# Patient Record
Sex: Female | Born: 1935 | Race: White | Hispanic: No | Marital: Married | Smoking: Never smoker
Health system: Southern US, Community
[De-identification: ages and names within clinical notes are randomized; demographics above are authoritative.]

## PROBLEM LIST (undated history)

## (undated) DIAGNOSIS — T4145XA Adverse effect of unspecified anesthetic, initial encounter: Secondary | ICD-10-CM

## (undated) DIAGNOSIS — D593 Hemolytic-uremic syndrome: Secondary | ICD-10-CM

## (undated) DIAGNOSIS — H9193 Unspecified hearing loss, bilateral: Secondary | ICD-10-CM

## (undated) DIAGNOSIS — C801 Malignant (primary) neoplasm, unspecified: Secondary | ICD-10-CM

## (undated) DIAGNOSIS — I499 Cardiac arrhythmia, unspecified: Secondary | ICD-10-CM

## (undated) DIAGNOSIS — M1990 Unspecified osteoarthritis, unspecified site: Secondary | ICD-10-CM

## (undated) DIAGNOSIS — R55 Syncope and collapse: Secondary | ICD-10-CM

## (undated) DIAGNOSIS — C8200 Follicular lymphoma grade I, unspecified site: Secondary | ICD-10-CM

## (undated) DIAGNOSIS — I722 Aneurysm of renal artery: Secondary | ICD-10-CM

## (undated) DIAGNOSIS — I10 Essential (primary) hypertension: Secondary | ICD-10-CM

## (undated) DIAGNOSIS — R0989 Other specified symptoms and signs involving the circulatory and respiratory systems: Secondary | ICD-10-CM

## (undated) DIAGNOSIS — Z9289 Personal history of other medical treatment: Secondary | ICD-10-CM

## (undated) DIAGNOSIS — Z9221 Personal history of antineoplastic chemotherapy: Secondary | ICD-10-CM

## (undated) DIAGNOSIS — H8109 Meniere's disease, unspecified ear: Secondary | ICD-10-CM

## (undated) DIAGNOSIS — T8859XA Other complications of anesthesia, initial encounter: Secondary | ICD-10-CM

## (undated) DIAGNOSIS — M810 Age-related osteoporosis without current pathological fracture: Secondary | ICD-10-CM

## (undated) DIAGNOSIS — G459 Transient cerebral ischemic attack, unspecified: Secondary | ICD-10-CM

## (undated) DIAGNOSIS — Z923 Personal history of irradiation: Secondary | ICD-10-CM

## (undated) DIAGNOSIS — R112 Nausea with vomiting, unspecified: Secondary | ICD-10-CM

## (undated) DIAGNOSIS — K219 Gastro-esophageal reflux disease without esophagitis: Secondary | ICD-10-CM

## (undated) DIAGNOSIS — Z9889 Other specified postprocedural states: Secondary | ICD-10-CM

## (undated) DIAGNOSIS — G250 Essential tremor: Secondary | ICD-10-CM

## (undated) DIAGNOSIS — E860 Dehydration: Secondary | ICD-10-CM

## (undated) DIAGNOSIS — I639 Cerebral infarction, unspecified: Secondary | ICD-10-CM

## (undated) HISTORY — PX: COLONOSCOPY: SHX174

## (undated) HISTORY — PX: BREAST LUMPECTOMY: SHX2

## (undated) HISTORY — DX: Age-related osteoporosis without current pathological fracture: M81.0

## (undated) HISTORY — DX: Transient cerebral ischemic attack, unspecified: G45.9

## (undated) HISTORY — DX: Cerebral infarction, unspecified: I63.9

## (undated) HISTORY — PX: TUBAL LIGATION: SHX77

## (undated) HISTORY — DX: Follicular lymphoma grade I, unspecified site: C82.00

## (undated) HISTORY — DX: Meniere's disease, unspecified ear: H81.09

## (undated) HISTORY — PX: FRACTURE SURGERY: SHX138

## (undated) HISTORY — DX: Hemolytic-uremic syndrome: D59.3

## (undated) HISTORY — PX: APPENDECTOMY: SHX54

---
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
## 2002-08-06 DIAGNOSIS — C8200 Follicular lymphoma grade I, unspecified site: Secondary | ICD-10-CM

## 2002-08-06 DIAGNOSIS — C8290 Follicular lymphoma, unspecified, unspecified site: Secondary | ICD-10-CM | POA: Insufficient documentation

## 2002-08-06 HISTORY — DX: Follicular lymphoma grade I, unspecified site: C82.00

## 2002-08-07 HISTORY — PX: LYMPH NODE BIOPSY: SHX201

## 2002-08-09 HISTORY — PX: BONE MARROW BIOPSY: SHX199

## 2006-06-05 ENCOUNTER — Ambulatory Visit: Payer: Self-pay | Admitting: Internal Medicine

## 2006-07-27 ENCOUNTER — Ambulatory Visit: Payer: Self-pay | Admitting: Internal Medicine

## 2007-01-28 ENCOUNTER — Ambulatory Visit: Payer: Self-pay | Admitting: Internal Medicine

## 2008-02-03 ENCOUNTER — Ambulatory Visit: Payer: Self-pay | Admitting: Internal Medicine

## 2008-10-17 ENCOUNTER — Ambulatory Visit: Payer: Self-pay | Admitting: Hematology and Oncology

## 2009-02-15 ENCOUNTER — Ambulatory Visit: Payer: Self-pay | Admitting: Ophthalmology

## 2009-02-27 ENCOUNTER — Ambulatory Visit: Payer: Self-pay | Admitting: Ophthalmology

## 2009-03-15 HISTORY — PX: CATARACT EXTRACTION: SUR2

## 2009-09-21 ENCOUNTER — Ambulatory Visit: Payer: Self-pay | Admitting: Internal Medicine

## 2010-03-27 ENCOUNTER — Ambulatory Visit: Payer: Self-pay | Admitting: Internal Medicine

## 2010-10-16 ENCOUNTER — Ambulatory Visit: Payer: Self-pay | Admitting: Internal Medicine

## 2010-10-29 ENCOUNTER — Ambulatory Visit: Payer: Self-pay | Admitting: Internal Medicine

## 2011-09-19 DIAGNOSIS — E559 Vitamin D deficiency, unspecified: Secondary | ICD-10-CM | POA: Insufficient documentation

## 2011-09-19 DIAGNOSIS — K219 Gastro-esophageal reflux disease without esophagitis: Secondary | ICD-10-CM | POA: Insufficient documentation

## 2011-09-19 DIAGNOSIS — C8590 Non-Hodgkin lymphoma, unspecified, unspecified site: Secondary | ICD-10-CM | POA: Insufficient documentation

## 2011-11-07 ENCOUNTER — Ambulatory Visit: Payer: Self-pay | Admitting: Family Medicine

## 2011-12-16 DIAGNOSIS — C801 Malignant (primary) neoplasm, unspecified: Secondary | ICD-10-CM

## 2011-12-16 HISTORY — DX: Malignant (primary) neoplasm, unspecified: C80.1

## 2012-09-14 DIAGNOSIS — R251 Tremor, unspecified: Secondary | ICD-10-CM | POA: Insufficient documentation

## 2012-09-14 DIAGNOSIS — R55 Syncope and collapse: Secondary | ICD-10-CM | POA: Insufficient documentation

## 2012-09-30 DIAGNOSIS — H44119 Panuveitis, unspecified eye: Secondary | ICD-10-CM | POA: Insufficient documentation

## 2012-09-30 DIAGNOSIS — H359 Unspecified retinal disorder: Secondary | ICD-10-CM | POA: Insufficient documentation

## 2012-11-26 ENCOUNTER — Ambulatory Visit: Payer: Self-pay | Admitting: Family Medicine

## 2012-12-17 ENCOUNTER — Ambulatory Visit: Payer: Self-pay | Admitting: Family Medicine

## 2012-12-28 DIAGNOSIS — R921 Mammographic calcification found on diagnostic imaging of breast: Secondary | ICD-10-CM | POA: Insufficient documentation

## 2013-03-16 DIAGNOSIS — G250 Essential tremor: Secondary | ICD-10-CM | POA: Insufficient documentation

## 2013-05-04 ENCOUNTER — Encounter: Payer: Self-pay | Admitting: Otolaryngology

## 2013-05-15 ENCOUNTER — Encounter: Payer: Self-pay | Admitting: Otolaryngology

## 2013-06-14 ENCOUNTER — Encounter: Payer: Self-pay | Admitting: Otolaryngology

## 2013-09-16 DIAGNOSIS — R634 Abnormal weight loss: Secondary | ICD-10-CM | POA: Insufficient documentation

## 2013-09-19 DIAGNOSIS — B351 Tinea unguium: Secondary | ICD-10-CM

## 2013-11-26 ENCOUNTER — Emergency Department: Payer: Self-pay | Admitting: Emergency Medicine

## 2013-11-26 LAB — URINALYSIS, COMPLETE
Bilirubin,UR: NEGATIVE
Glucose,UR: NEGATIVE mg/dL (ref 0–75)
Leukocyte Esterase: NEGATIVE
Ph: 7 (ref 4.5–8.0)
Protein: NEGATIVE
Specific Gravity: 1.008 (ref 1.003–1.030)
Squamous Epithelial: <1
WBC UR: 1 /HPF (ref 0–5)

## 2013-11-26 LAB — COMPREHENSIVE METABOLIC PANEL
Alkaline Phosphatase: 61 U/L
Anion Gap: 5 — ABNORMAL LOW (ref 7–16)
BUN: 10 mg/dL (ref 7–18)
Chloride: 91 mmol/L — ABNORMAL LOW (ref 98–107)
Co2: 29 mmol/L (ref 21–32)
EGFR (African American): >60
EGFR (Non-African Amer.): >60
SGOT(AST): 35 U/L (ref 15–37)
SGPT (ALT): 29 U/L (ref 12–78)
Sodium: 125 mmol/L — ABNORMAL LOW (ref 136–145)
Total Protein: 6 g/dL — ABNORMAL LOW (ref 6.4–8.2)

## 2013-11-26 LAB — CBC
HCT: 38.1 % (ref 35.0–47.0)
HGB: 12.4 g/dL (ref 12.0–16.0)
Platelet: 169 10*3/uL (ref 150–440)
RDW: 13.6 % (ref 11.5–14.5)

## 2013-12-15 DIAGNOSIS — I639 Cerebral infarction, unspecified: Secondary | ICD-10-CM

## 2013-12-15 HISTORY — DX: Cerebral infarction, unspecified: I63.9

## 2014-02-03 DIAGNOSIS — M79609 Pain in unspecified limb: Secondary | ICD-10-CM

## 2014-04-27 DIAGNOSIS — M79609 Pain in unspecified limb: Secondary | ICD-10-CM

## 2014-07-31 DIAGNOSIS — B351 Tinea unguium: Secondary | ICD-10-CM

## 2014-08-28 ENCOUNTER — Ambulatory Visit: Payer: Self-pay | Admitting: Oncology

## 2014-08-28 LAB — LACTATE DEHYDROGENASE: LDH: 151 U/L (ref 81–246)

## 2014-08-28 LAB — CBC CANCER CENTER
BASOS PCT: 1.4 %
Basophil #: 0.1 x10 3/mm (ref 0.0–0.1)
EOS PCT: 1.2 %
Eosinophil #: 0.1 x10 3/mm (ref 0.0–0.7)
HCT: 41.2 % (ref 35.0–47.0)
HGB: 13.6 g/dL (ref 12.0–16.0)
LYMPHS ABS: 1 x10 3/mm (ref 1.0–3.6)
Lymphocyte %: 14.2 %
MCH: 33 pg (ref 26.0–34.0)
MCHC: 32.9 g/dL (ref 32.0–36.0)
MCV: 100 fL (ref 80–100)
MONO ABS: 0.6 x10 3/mm (ref 0.2–0.9)
Monocyte %: 9.1 %
Neutrophil #: 5.2 x10 3/mm (ref 1.4–6.5)
Neutrophil %: 74.1 %
Platelet: 226 x10 3/mm (ref 150–440)
RBC: 4.11 10*6/uL (ref 3.80–5.20)
RDW: 12.9 % (ref 11.5–14.5)
WBC: 7 x10 3/mm (ref 3.6–11.0)

## 2014-09-14 ENCOUNTER — Ambulatory Visit: Payer: Self-pay | Admitting: Oncology

## 2014-10-24 DIAGNOSIS — B351 Tinea unguium: Secondary | ICD-10-CM

## 2015-01-25 DIAGNOSIS — L600 Ingrowing nail: Secondary | ICD-10-CM

## 2015-02-23 ENCOUNTER — Ambulatory Visit
Admit: 2015-02-23 | Disposition: A | Discharge status: Home / Self Care | Payer: Self-pay | Attending: Hematology and Oncology | Admitting: Hematology and Oncology

## 2015-03-16 ENCOUNTER — Ambulatory Visit
Admit: 2015-03-16 | Disposition: A | Discharge status: Home / Self Care | Payer: Self-pay | Attending: Hematology and Oncology | Admitting: Hematology and Oncology

## 2015-03-30 ENCOUNTER — Other Ambulatory Visit: Payer: Self-pay | Admitting: Hematology and Oncology

## 2015-03-30 DIAGNOSIS — C8290 Follicular lymphoma, unspecified, unspecified site: Secondary | ICD-10-CM

## 2015-04-03 ENCOUNTER — Other Ambulatory Visit: Payer: Self-pay | Admitting: Family Medicine

## 2015-04-03 DIAGNOSIS — Z13820 Encounter for screening for osteoporosis: Secondary | ICD-10-CM

## 2015-04-18 ENCOUNTER — Ambulatory Visit
Admission: RE | Admit: 2015-04-18 | Discharge: 2015-04-18 | Disposition: A | Discharge status: Home / Self Care | Payer: Medicare Other | Source: Ambulatory Visit | Attending: Family Medicine | Admitting: Family Medicine

## 2015-04-18 DIAGNOSIS — Z13820 Encounter for screening for osteoporosis: Secondary | ICD-10-CM | POA: Diagnosis not present

## 2015-04-18 DIAGNOSIS — M810 Age-related osteoporosis without current pathological fracture: Secondary | ICD-10-CM | POA: Insufficient documentation

## 2015-05-16 DIAGNOSIS — B351 Tinea unguium: Secondary | ICD-10-CM

## 2015-05-28 ENCOUNTER — Other Ambulatory Visit: Payer: Self-pay | Admitting: Oncology

## 2015-08-13 DIAGNOSIS — D593 Hemolytic-uremic syndrome, unspecified: Secondary | ICD-10-CM

## 2015-08-13 HISTORY — DX: Hemolytic-uremic syndrome: D59.3

## 2015-08-13 HISTORY — DX: Hemolytic-uremic syndrome, unspecified: D59.30

## 2015-08-15 DIAGNOSIS — A498 Other bacterial infections of unspecified site: Secondary | ICD-10-CM | POA: Insufficient documentation

## 2015-08-15 DIAGNOSIS — S37009A Unspecified injury of unspecified kidney, initial encounter: Secondary | ICD-10-CM | POA: Insufficient documentation

## 2015-08-24 ENCOUNTER — Encounter
Admission: RE | Admit: 2015-08-24 | Discharge: 2015-08-24 | Disposition: A | Discharge status: Home / Self Care | Payer: Medicare Other | Source: Ambulatory Visit | Attending: Internal Medicine | Admitting: Internal Medicine

## 2015-09-03 ENCOUNTER — Telehealth: Payer: Self-pay | Admitting: *Deleted

## 2015-09-03 NOTE — Telephone Encounter (Signed)
  I reviewed her last note.  She does need a chest CT.  We can accept the scans from [facility].  M

## 2015-09-03 NOTE — Telephone Encounter (Signed)
Has CT ordered for Friday and fu later this month, Has been hospitalized at [facility] with E Coli and is now in rehab at [facility]. Asking if she still need to have CT scan done since she had a scan in [facility] (she had CT Abd and Pelvis, NOT a Chest)

## 2015-09-03 NOTE — Telephone Encounter (Signed)
Patient on Isolation at facility and unable to make appt for Friday. He requested that we cancel her appts and he will call to reschedule them once she is discharged from facility

## 2015-09-03 NOTE — Telephone Encounter (Signed)
  Sounds good. I will send a reminder to Theadora Rama so we keep her on our list.  M

## 2015-09-04 ENCOUNTER — Other Ambulatory Visit
Admission: RE | Admit: 2015-09-04 | Discharge: 2015-09-04 | Disposition: A | Discharge status: Home / Self Care | Payer: Medicare Other | Source: Other Acute Inpatient Hospital | Attending: Gerontology | Admitting: Gerontology

## 2015-09-04 LAB — CBC WITH DIFFERENTIAL/PLATELET
BASOS ABS: 0.1 10*3/uL (ref 0–0.1)
BASOS PCT: 1 %
Eosinophils Absolute: 0.2 10*3/uL (ref 0–0.7)
Eosinophils Relative: 2 %
HEMATOCRIT: 23.6 % — AB (ref 35.0–47.0)
Hemoglobin: 7.6 g/dL — ABNORMAL LOW (ref 12.0–16.0)
LYMPHS PCT: 6 %
Lymphs Abs: 0.4 10*3/uL — ABNORMAL LOW (ref 1.0–3.6)
MCH: 31.7 pg (ref 26.0–34.0)
MCHC: 32.1 g/dL (ref 32.0–36.0)
MCV: 98.8 fL (ref 80.0–100.0)
MONO ABS: 0.5 10*3/uL (ref 0.2–0.9)
Monocytes Relative: 7 %
NEUTROS ABS: 6.3 10*3/uL (ref 1.4–6.5)
NEUTROS PCT: 84 %
Platelets: 274 10*3/uL (ref 150–440)
RBC: 2.39 MIL/uL — AB (ref 3.80–5.20)
RDW: 17.7 % — AB (ref 11.5–14.5)
WBC: 7.5 10*3/uL (ref 3.6–11.0)

## 2015-09-04 LAB — BASIC METABOLIC PANEL
ANION GAP: 5 (ref 5–15)
BUN: 15 mg/dL (ref 6–20)
CALCIUM: 7.4 mg/dL — AB (ref 8.9–10.3)
CO2: 28 mmol/L (ref 22–32)
Chloride: 101 mmol/L (ref 101–111)
Creatinine, Ser: 0.74 mg/dL (ref 0.44–1.00)
GLUCOSE: 131 mg/dL — AB (ref 65–99)
POTASSIUM: 3.1 mmol/L — AB (ref 3.5–5.1)
SODIUM: 134 mmol/L — AB (ref 135–145)

## 2015-09-04 LAB — HEPATIC FUNCTION PANEL
ALBUMIN: 2.3 g/dL — AB (ref 3.5–5.0)
ALT: 18 U/L (ref 14–54)
AST: 22 U/L (ref 15–41)
Alkaline Phosphatase: 135 U/L — ABNORMAL HIGH (ref 38–126)
BILIRUBIN TOTAL: 0.4 mg/dL (ref 0.3–1.2)
Bilirubin, Direct: <0.1 mg/dL — ABNORMAL LOW (ref 0.1–0.5)
Total Protein: 4.2 g/dL — ABNORMAL LOW (ref 6.5–8.1)

## 2015-09-04 LAB — MAGNESIUM: Magnesium: 1.6 mg/dL — ABNORMAL LOW (ref 1.7–2.4)

## 2015-09-04 LAB — TSH: TSH: 10.058 u[IU]/mL — AB (ref 0.350–4.500)

## 2015-09-04 LAB — LACTATE DEHYDROGENASE: LDH: 204 U/L — AB (ref 98–192)

## 2015-09-05 LAB — VITAMIN B12: VITAMIN B 12: 571 pg/mL (ref 180–914)

## 2015-09-07 ENCOUNTER — Ambulatory Visit: Payer: Medicare Other

## 2015-09-07 ENCOUNTER — Other Ambulatory Visit: Payer: Medicare Other

## 2015-09-09 LAB — VITAMIN D 1,25 DIHYDROXY
VITAMIN D3 1, 25 (OH): 50 pg/mL
Vitamin D 1, 25 (OH)2 Total: 51 pg/mL

## 2015-09-11 ENCOUNTER — Ambulatory Visit: Payer: Medicare Other | Admitting: Hematology and Oncology

## 2015-09-11 LAB — COMPREHENSIVE METABOLIC PANEL
ALBUMIN: 2.5 g/dL — AB (ref 3.5–5.0)
ALT: 38 U/L (ref 14–54)
ANION GAP: 8 (ref 5–15)
AST: 28 U/L (ref 15–41)
Alkaline Phosphatase: 251 U/L — ABNORMAL HIGH (ref 38–126)
BUN: 22 mg/dL — ABNORMAL HIGH (ref 6–20)
CHLORIDE: 99 mmol/L — AB (ref 101–111)
CO2: 27 mmol/L (ref 22–32)
Calcium: 7.8 mg/dL — ABNORMAL LOW (ref 8.9–10.3)
Creatinine, Ser: 0.73 mg/dL (ref 0.44–1.00)
GFR calc non Af Amer: >60 mL/min (ref >60)
GLUCOSE: 102 mg/dL — AB (ref 65–99)
Potassium: 3.6 mmol/L (ref 3.5–5.1)
SODIUM: 134 mmol/L — AB (ref 135–145)
Total Bilirubin: 0.6 mg/dL (ref 0.3–1.2)
Total Protein: 4.6 g/dL — ABNORMAL LOW (ref 6.5–8.1)

## 2015-09-11 LAB — CBC WITH DIFFERENTIAL/PLATELET
BASOS PCT: 1 %
Basophils Absolute: 0.1 10*3/uL (ref 0–0.1)
EOS ABS: 0.3 10*3/uL (ref 0–0.7)
EOS PCT: 4 %
HCT: 25.6 % — ABNORMAL LOW (ref 35.0–47.0)
HEMOGLOBIN: 8.5 g/dL — AB (ref 12.0–16.0)
LYMPHS ABS: 0.4 10*3/uL — AB (ref 1.0–3.6)
Lymphocytes Relative: 5 %
MCH: 33.2 pg (ref 26.0–34.0)
MCHC: 33.1 g/dL (ref 32.0–36.0)
MCV: 100.4 fL — ABNORMAL HIGH (ref 80.0–100.0)
Monocytes Absolute: 0.9 10*3/uL (ref 0.2–0.9)
Monocytes Relative: 12 %
NEUTROS PCT: 78 %
Neutro Abs: 5.9 10*3/uL (ref 1.4–6.5)
PLATELETS: 333 10*3/uL (ref 150–440)
RBC: 2.55 MIL/uL — AB (ref 3.80–5.20)
RDW: 17.6 % — ABNORMAL HIGH (ref 11.5–14.5)
WBC: 7.6 10*3/uL (ref 3.6–11.0)

## 2015-09-26 ENCOUNTER — Ambulatory Visit
Admission: RE | Admit: 2015-09-26 | Discharge: 2015-09-26 | Disposition: A | Discharge status: Home / Self Care | Payer: Medicare Other | Source: Ambulatory Visit | Attending: Hematology and Oncology | Admitting: Hematology and Oncology

## 2015-09-26 DIAGNOSIS — C8280 Other types of follicular lymphoma, unspecified site: Secondary | ICD-10-CM | POA: Insufficient documentation

## 2015-09-26 DIAGNOSIS — I722 Aneurysm of renal artery: Secondary | ICD-10-CM | POA: Insufficient documentation

## 2015-09-26 DIAGNOSIS — C8290 Follicular lymphoma, unspecified, unspecified site: Secondary | ICD-10-CM

## 2015-09-26 DIAGNOSIS — R160 Hepatomegaly, not elsewhere classified: Secondary | ICD-10-CM | POA: Insufficient documentation

## 2015-09-26 HISTORY — DX: Malignant (primary) neoplasm, unspecified: C80.1

## 2015-09-26 MED ORDER — IOHEXOL 300 MG/ML  SOLN
100.0000 mL | Freq: Once | INTRAMUSCULAR | Status: AC | PRN
  Administered 2015-09-26: 85 mL via INTRAVENOUS

## 2015-10-02 ENCOUNTER — Inpatient Hospital Stay: Payer: Medicare Other | Attending: Hematology and Oncology | Admitting: Hematology and Oncology

## 2015-10-02 VITALS — BP 158/73 | HR 79 | Temp 96.6°F | Resp 18 | Ht 65.0 in | Wt 111.3 lb

## 2015-10-02 DIAGNOSIS — Z9221 Personal history of antineoplastic chemotherapy: Secondary | ICD-10-CM | POA: Diagnosis not present

## 2015-10-02 DIAGNOSIS — Z8673 Personal history of transient ischemic attack (TIA), and cerebral infarction without residual deficits: Secondary | ICD-10-CM | POA: Diagnosis not present

## 2015-10-02 DIAGNOSIS — R160 Hepatomegaly, not elsewhere classified: Secondary | ICD-10-CM | POA: Diagnosis not present

## 2015-10-02 DIAGNOSIS — M810 Age-related osteoporosis without current pathological fracture: Secondary | ICD-10-CM | POA: Diagnosis not present

## 2015-10-02 DIAGNOSIS — R600 Localized edema: Secondary | ICD-10-CM | POA: Diagnosis not present

## 2015-10-02 DIAGNOSIS — Z79899 Other long term (current) drug therapy: Secondary | ICD-10-CM | POA: Insufficient documentation

## 2015-10-02 DIAGNOSIS — C8238 Follicular lymphoma grade IIIa, lymph nodes of multiple sites: Secondary | ICD-10-CM

## 2015-10-02 DIAGNOSIS — R2689 Other abnormalities of gait and mobility: Secondary | ICD-10-CM | POA: Insufficient documentation

## 2015-10-02 DIAGNOSIS — Z7982 Long term (current) use of aspirin: Secondary | ICD-10-CM | POA: Insufficient documentation

## 2015-10-02 DIAGNOSIS — Z923 Personal history of irradiation: Secondary | ICD-10-CM

## 2015-10-02 DIAGNOSIS — C8203 Follicular lymphoma grade I, intra-abdominal lymph nodes: Secondary | ICD-10-CM

## 2015-10-02 NOTE — Progress Notes (Signed)
Patient is here for yearly follow-up of lymphoma and CT Scan results. Patient states that she was recently in the hospital at [facility] for 13 days with E. Coli. She was then discharged to rehab in [place] for 3 weeks. She states that she is feeling much better since coming home.

## 2015-10-02 NOTE — Progress Notes (Signed)
[facility] day:  10/02/2015  Chief Complaint: BEREA MAJKOWSKI is a 81 y.o. female with a history of stage IIIA follicular non-Hodgkin's lymphoma who is seen for 6 month assessment.  HPI: The patient was last seen in the medical oncology clinic on 02/26/2015.  At that time, she was seen for initial assessment by me. Symptomatically, she denied any B symptoms.  Exam revealed a tiny left posterior cervical lymph node and no hepatosplenomegaly.  Labs included a hematocrit of 40.5, hemoglobin 13.6, MCV is 96, platelets 244,000, white count 4900 with an ANC of 3700.  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  She had a complicated course with acute renal failure and anemia.  She was then in rehab for 3 weeks.  She continues to receive physical therapy.  She is working on her balance.  Per prior plan, she was scheduled for follow-up imaging studies.  Chest, abdomen, and pelvic CT scan on 09/26/2015 revealed stable portacaval and aortocaval lymph nodes and mild hepatomegaly.  Portacaval nodes measured 2.5 cm.  Aortocaval nodes measured 1.0 cm.  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  Symptomatically, she has limited energy.  She still has some balance issues.  She uses a cane.  She has some edema in her ankles.  She is wearing Ted hose.  Past Medical History  Diagnosis Date  . Cancer [facility]) 2013    Non-Hodgkin Lymphoma with chemo and rad tx  . 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 Mini stroke [facility])     summer 2014  . Meniere's disease     with bilateral hearing loss  . Osteoporosis   . Follicular lymphoma grade I ([place]) 08/06/2002    Past Surgical History  Procedure Laterality Date  . Cataract extraction  03/2009  . Tubal ligation    . Breast lumpectomy  1950s    benign  .  Bone marrow biopsy  08/09/2002  . Lymph node biopsy  08/07/2002    cervical    No family history on file.  Social History:  has no tobacco, alcohol, and drug history on file.  The patient is accompanied by her husband today.  Allergies:  Allergies  Allergen Reactions  . Lisinopril Cough    Current Medications: Current Outpatient Prescriptions  Medication Sig Dispense Refill  . levothyroxine (SYNTHROID, LEVOTHROID) 25 MCG tablet Take by mouth.    . potassium chloride (MICRO-K) 10 MEQ CR capsule Take by mouth.    . torsemide (DEMADEX) 10 MG tablet Take by mouth.    . traZODone (DESYREL) 50 MG tablet TAKE 1 TO 2 TABLETS BY MOUTH DAILY AT BEDTIME AS NEEDED    . acetaminophen (TYLENOL) 325 MG tablet Take by mouth.    [name] aspirin 81 MG chewable tablet Chew by mouth.    . calcium-vitamin D (CALCIUM 500/D) 500-200 MG-UNIT tablet Take by mouth.    . famotidine (PEPCID) 20 MG tablet Take by mouth.    . Magnesium 200 MG TABS Take by mouth.    . Multiple Vitamin (MULTI-VITAMINS) TABS Take by mouth.    . triamcinolone cream (KENALOG) 0.1 % Apply topically.    . Turmeric Curcumin 500 MG CAPS Take by mouth.    . zoledronic acid (RECLAST) 5 MG/100ML SOLN injection Inject into the vein.  No current facility-administered medications for this visit.    Review of Systems:  GENERAL:  Limited energy.  No fevers, sweats or weight loss. PERFORMANCE STATUS (ECOG):  2 HEENT:  No visual changes, runny nose, sore throat, mouth sores or tenderness. Lungs: No shortness of breath or cough.  No hemoptysis. Cardiac:  No chest pain, palpitations, orthopnea, or PND. GI:  No nausea, vomiting, diarrhea, constipation, melena or hematochezia. GU:  No urgency, frequency, dysuria, or hematuria. Musculoskeletal:  No back pain.  No joint pain.  No muscle tenderness. Extremities:  Swelling in ankles. Skin:  No rashes or skin changes. Neuro:  Balance issues.  No headache, numbness or weakness or coordination  issues. Endocrine:  No diabetes, thyroid issues, hot flashes or night sweats. Psych:  No mood changes, depression or anxiety. Pain:  No focal pain. Review of systems:  All other systems reviewed and found to be negative.  Physical Exam: Blood pressure 158/73, pulse 79, temperature 96.6 F (35.9 C), temperature source Tympanic, resp. rate 18, height 5' 5"  (1.651 m), weight 111 lb 5.3 oz (50.5 kg). GENERAL:  Thin elderly woman sitting comfortably in the exam room in no acute distress. MENTAL STATUS:  Alert and oriented to person, place and time. HEAD:  Short gray hair.  Normocephalic, atraumatic, face symmetric, no Cushingoid features. EYES:  Glasses.  Blue eyes.  Pupils equal round and reactive to light and accomodation.  No conjunctivitis or scleral icterus. ENT:  Oropharynx clear without lesion.  Tongue normal. Mucous membranes moist.  RESPIRATORY:  Clear to auscultation without rales, wheezes or rhonchi. CARDIOVASCULAR:  Regular rate and rhythm without murmur, rub or gallop. ABDOMEN:  Soft, non-tender, with active bowel sounds, and no hepatosplenomegaly.  No masses. SKIN:  No rashes, ulcers or lesions. EXTREMITIES: No edema, no skin discoloration or tenderness.  No palpable cords. LYMPH NODES: No palpable cervical, supraclavicular, axillary or inguinal adenopathy  NEUROLOGICAL: Unremarkable. PSYCH:  Appropriate.  No visits with results within 3 Day(s) from this visit. Latest known visit with results is:  Hospital Outpatient Visit on 09/04/2015  Component Date Value Ref Range Status  . WBC 09/04/2015 7.5  3.6 - 11.0 K/uL Final  . RBC 09/04/2015 2.39* 3.80 - 5.20 MIL/uL Final  . Hemoglobin 09/04/2015 7.6* 12.0 - 16.0 g/dL Final  . HCT 09/04/2015 23.6* 35.0 - 47.0 % Final  . MCV 09/04/2015 98.8  80.0 - 100.0 fL Final  . MCH 09/04/2015 31.7  26.0 - 34.0 pg Final  . MCHC 09/04/2015 32.1  32.0 - 36.0 g/dL Final  . RDW 09/04/2015 17.7* 11.5 - 14.5 % Final  . Platelets 09/04/2015 274   150 - 440 K/uL Final  . Neutrophils Relative % 09/04/2015 84   Final  . Neutro Abs 09/04/2015 6.3  1.4 - 6.5 K/uL Final  . Lymphocytes Relative 09/04/2015 6   Final  . Lymphs Abs 09/04/2015 0.4* 1.0 - 3.6 K/uL Final  . Monocytes Relative 09/04/2015 7   Final  . Monocytes Absolute 09/04/2015 0.5  0.2 - 0.9 K/uL Final  . Eosinophils Relative 09/04/2015 2   Final  . Eosinophils Absolute 09/04/2015 0.2  0 - 0.7 K/uL Final  . Basophils Relative 09/04/2015 1   Final  . Basophils Absolute 09/04/2015 0.1  0 - 0.1 K/uL Final  . Total Protein 09/04/2015 4.2* 6.5 - 8.1 g/dL Final  . Albumin 09/04/2015 2.3* 3.5 - 5.0 g/dL Final  . AST 09/04/2015 22  15 - 41 U/L Final  . ALT 09/04/2015 18  14 - 54 U/L Final  . Alkaline Phosphatase 09/04/2015 135* 38 - 126 U/L Final  . Total Bilirubin 09/04/2015 0.4  0.3 - 1.2 mg/dL Final  . Bilirubin, Direct 09/04/2015 <0.1* 0.1 - 0.5 mg/dL Final  . Indirect Bilirubin 09/04/2015 NOT CALCULATED  0.3 - 0.9 mg/dL Final  . Magnesium 09/04/2015 1.6* 1.7 - 2.4 mg/dL Final  . TSH 09/04/2015 10.058* 0.350 - 4.500 uIU/mL Final  . Vitamin B-12 09/04/2015 571  180 - 914 pg/mL Final   Comment: (NOTE) This assay is not validated for testing neonatal or myeloproliferative syndrome specimens for Vitamin B12 levels. Performed at [facility]   . Vitamin D 1, 25 (OH)2 Total 09/04/2015 51   Final   Comment: (NOTE) Reference Range: Adults: 21 - 65   . Vitamin D3 1, 25 (OH)2 09/04/2015 50   Final   Comment: (NOTE) Performed At: [facility] [place], [place] [id number] Pepkowitz Sheral Apley MD [id number]   . Vitamin D2 1, 25 (OH)2 09/04/2015 <10   Final  . LDH 09/04/2015 204* 98 - 192 U/L Final    Assessment:  MYLINH CRAGG is a 81 y.o. female with a history of stage IIIA follicular non-Hodgkin's lymphoma.  She presented in the summer of 2003 with an epigastric and left upper quadrant mass and left arm weakness with decreased  mobility. Abdominal ultrasound revealed a large mass in the head of the pancreas with  multiple enlarged lymph nodes and a large lobulated mass in the right retroperitoneal area.  MRI of the left brachial plexus revealed a 2.5 cm soft tissue mass and abnormal lymph node along the left brachial plexus and in the supraclavicular region.  Cervical node biopsy on 08/06/2002 revealed a grade I follicular non-Hodgkin's lymphoma.  Bone marrow on 08/09/2002 was negative.  Lumbar puncture was negative.  She received Leukeran and Rituxan. As her arm pain and mobility was not relieved, she received local radiation to the left brachial plexus area. Leukeran continued through 02/28/2013 (discontinued secondary side effects).  She developed progressive disease in 10/2003. Patient received CVP chemotherapy beginning 01/02/2004. In 06/2004 she was switched to CNOP.  She received 8 cycles completing on 01/15/2005.  She began maintenance Rituxan on 04/23/2004 (4 weekly doses every 3 months for 2 years). Therapy completed on 09/13/2008.  Restaging chest, abdomen, and pelvic CT scan on 09/02/2013 revealed no significant interval change. She has some mildly enlarged intra-aortic caval retroperitoneal nodes measuring 1.6 x 1.2 cm. There were stable subcentimeter retroperitoneal nodes.  Chest, abdomen, and pelvic CT cans on 09/07/2014 revealed no evidence of lymphoma recurrence.  Chest, abdomen, and pelvic CT scan on 09/26/2015 revealed stable portacaval (2.5 cm) and aortocaval (1.0 cm) lymph nodes and mild hepatomegaly.    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  She had a complicated course with acute renal failure and anemia.  She was then in rehab for 3 weeks.  She continues to receive physical therapy.  She is working on her balance.  Symptomatically, she denies any B symptoms.  Exam reveals no adenopathy or hepatosplenomegaly.  Plan: 1. Labs today:   CBC with diff, CMP, LDH, uric acid. 2. Review interval CT scans-done. 3. RTC in 6 months for MD assessment and labs (CBC with diff, CMP, LDH, uric acid)   Lequita Asal, MD  10/02/2015, 5:02 PM

## 2015-12-27 DIAGNOSIS — Z87898 Personal history of other specified conditions: Secondary | ICD-10-CM | POA: Insufficient documentation

## 2016-01-28 ENCOUNTER — Encounter: Payer: Self-pay | Admitting: Hematology and Oncology

## 2016-02-01 DIAGNOSIS — M79673 Pain in unspecified foot: Secondary | ICD-10-CM

## 2016-03-05 DIAGNOSIS — I722 Aneurysm of renal artery: Secondary | ICD-10-CM | POA: Insufficient documentation

## 2016-03-06 DIAGNOSIS — E7800 Pure hypercholesterolemia, unspecified: Secondary | ICD-10-CM | POA: Insufficient documentation

## 2016-03-31 ENCOUNTER — Other Ambulatory Visit: Payer: Self-pay | Admitting: *Deleted

## 2016-03-31 DIAGNOSIS — C8203 Follicular lymphoma grade I, intra-abdominal lymph nodes: Secondary | ICD-10-CM

## 2016-04-01 ENCOUNTER — Inpatient Hospital Stay: Payer: Medicare Other | Attending: Hematology and Oncology

## 2016-04-01 ENCOUNTER — Encounter: Payer: Self-pay | Admitting: Hematology and Oncology

## 2016-04-01 ENCOUNTER — Inpatient Hospital Stay (HOSPITAL_BASED_OUTPATIENT_CLINIC_OR_DEPARTMENT_OTHER): Payer: Medicare Other | Admitting: Hematology and Oncology

## 2016-04-01 VITALS — BP 143/80 | HR 61 | Temp 95.9°F | Resp 17 | Ht 65.0 in | Wt 110.2 lb

## 2016-04-01 DIAGNOSIS — Z8673 Personal history of transient ischemic attack (TIA), and cerebral infarction without residual deficits: Secondary | ICD-10-CM | POA: Insufficient documentation

## 2016-04-01 DIAGNOSIS — R2689 Other abnormalities of gait and mobility: Secondary | ICD-10-CM

## 2016-04-01 DIAGNOSIS — Z923 Personal history of irradiation: Secondary | ICD-10-CM

## 2016-04-01 DIAGNOSIS — Z9221 Personal history of antineoplastic chemotherapy: Secondary | ICD-10-CM | POA: Diagnosis not present

## 2016-04-01 DIAGNOSIS — Z800 Family history of malignant neoplasm of digestive organs: Secondary | ICD-10-CM

## 2016-04-01 DIAGNOSIS — C8203 Follicular lymphoma grade I, intra-abdominal lymph nodes: Secondary | ICD-10-CM

## 2016-04-01 DIAGNOSIS — R600 Localized edema: Secondary | ICD-10-CM | POA: Diagnosis not present

## 2016-04-01 DIAGNOSIS — Z79899 Other long term (current) drug therapy: Secondary | ICD-10-CM

## 2016-04-01 DIAGNOSIS — Z7982 Long term (current) use of aspirin: Secondary | ICD-10-CM

## 2016-04-01 DIAGNOSIS — C8238 Follicular lymphoma grade IIIa, lymph nodes of multiple sites: Secondary | ICD-10-CM

## 2016-04-01 DIAGNOSIS — M810 Age-related osteoporosis without current pathological fracture: Secondary | ICD-10-CM | POA: Diagnosis not present

## 2016-04-01 DIAGNOSIS — C4491 Basal cell carcinoma of skin, unspecified: Secondary | ICD-10-CM | POA: Insufficient documentation

## 2016-04-01 DIAGNOSIS — H8103 Meniere's disease, bilateral: Secondary | ICD-10-CM | POA: Diagnosis not present

## 2016-04-01 DIAGNOSIS — G43909 Migraine, unspecified, not intractable, without status migrainosus: Secondary | ICD-10-CM | POA: Insufficient documentation

## 2016-04-01 DIAGNOSIS — K649 Unspecified hemorrhoids: Secondary | ICD-10-CM | POA: Insufficient documentation

## 2016-04-01 DIAGNOSIS — H81319 Aural vertigo, unspecified ear: Secondary | ICD-10-CM | POA: Insufficient documentation

## 2016-04-01 LAB — COMPREHENSIVE METABOLIC PANEL
ALT: 21 U/L (ref 14–54)
AST: 20 U/L (ref 15–41)
Albumin: 4.2 g/dL (ref 3.5–5.0)
Alkaline Phosphatase: 73 U/L (ref 38–126)
Anion gap: 3 — ABNORMAL LOW (ref 5–15)
BUN: 22 mg/dL — ABNORMAL HIGH (ref 6–20)
CO2: 28 mmol/L (ref 22–32)
Calcium: 8.9 mg/dL (ref 8.9–10.3)
Chloride: 102 mmol/L (ref 101–111)
Creatinine, Ser: 0.85 mg/dL (ref 0.44–1.00)
GFR calc Af Amer: >60 mL/min (ref >60)
GFR calc non Af Amer: >60 mL/min (ref >60)
Glucose, Bld: 89 mg/dL (ref 65–99)
Potassium: 4.2 mmol/L (ref 3.5–5.1)
Sodium: 133 mmol/L — ABNORMAL LOW (ref 135–145)
Total Bilirubin: 0.8 mg/dL (ref 0.3–1.2)
Total Protein: 6.3 g/dL — ABNORMAL LOW (ref 6.5–8.1)

## 2016-04-01 LAB — CBC WITH DIFFERENTIAL/PLATELET
Basophils Absolute: 0.1 10*3/uL (ref 0–0.1)
Basophils Relative: 1 %
Eosinophils Absolute: 0.1 10*3/uL (ref 0–0.7)
Eosinophils Relative: 2 %
HCT: 37.9 % (ref 35.0–47.0)
Hemoglobin: 13.1 g/dL (ref 12.0–16.0)
Lymphocytes Relative: 13 %
Lymphs Abs: 0.7 10*3/uL — ABNORMAL LOW (ref 1.0–3.6)
MCH: 32.8 pg (ref 26.0–34.0)
MCHC: 34.6 g/dL (ref 32.0–36.0)
MCV: 94.9 fL (ref 80.0–100.0)
Monocytes Absolute: 0.5 10*3/uL (ref 0.2–0.9)
Monocytes Relative: 10 %
Neutro Abs: 4.1 10*3/uL (ref 1.4–6.5)
Neutrophils Relative %: 74 %
Platelets: 227 10*3/uL (ref 150–440)
RBC: 4 MIL/uL (ref 3.80–5.20)
RDW: 12.8 % (ref 11.5–14.5)
WBC: 5.5 10*3/uL (ref 3.6–11.0)

## 2016-04-01 LAB — LACTATE DEHYDROGENASE: LDH: 134 U/L (ref 98–192)

## 2016-04-01 LAB — URIC ACID: Uric Acid, Serum: 4.1 mg/dL (ref 2.3–6.6)

## 2016-04-01 NOTE — Progress Notes (Signed)
Pt states her Diarrhea has improved since she stopped her magnesium, has some constipation occasionally.  Reports last Sept and October of 2016 had Ecoli and was hospitalized 12 days at [facility] and rehab there after.

## 2016-04-01 NOTE — Progress Notes (Signed)
[place] [facility] day:  04/01/2016  Chief Complaint: [name] is a 82 y.o. female with a history of stage IIIA follicular non-Hodgkin's lymphoma who is seen for 6 month assessment.  HPI: The patient was last seen in the medical oncology clinic on 10/02/2015.  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  Chest, abdomen, and pelvic CT scan form 09/26/2015 revealed stable adenopathy.  She denied any B symptoms.  During the interim, she has has continued to improve slowly.  She is working on her balance.  Diarrhea has resolved after stopping magnesium.  She denies any B symptoms, adenopathy, or bruising or bleeding.   Past Medical History  Diagnosis Date  . Cancer [facility]) 2013    Non-Hodgkin Lymphoma with chemo and rad tx  . 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 Mini stroke [facility])     summer 2014  . Meniere's disease     with bilateral hearing loss  . Osteoporosis   . Follicular lymphoma grade I ([place]) 08/06/2002    Past Surgical History  Procedure Laterality Date  . Cataract extraction  03/2009  . Tubal ligation    . Breast lumpectomy  1950s    benign  . Bone marrow biopsy  08/09/2002  . Lymph node biopsy  08/07/2002    cervical    Family History  Problem Relation Age of Onset  . Cancer Father     Throat  . Cancer Sister 63    Colon    Social History:  reports that she has never smoked. She does not have any smokeless tobacco history on file. She reports that she does not drink alcohol or use illicit drugs.  The patient is accompanied by her husband today.  Allergies:  Allergies  Allergen Reactions  . Lisinopril Cough    Current Medications: Current Outpatient Prescriptions  Medication Sig Dispense Refill  . acetaminophen (TYLENOL) 325 MG tablet Take by mouth.    [name] aspirin 81 MG chewable tablet Chew by mouth.    . calcium-vitamin D  (CALCIUM 500/D) 500-200 MG-UNIT tablet Take by mouth.    . famotidine (PEPCID) 20 MG tablet Take by mouth.    . Multiple Vitamin (MULTI-VITAMINS) TABS Take by mouth.    . traZODone (DESYREL) 50 MG tablet TAKE 1 TO 2 TABLETS BY MOUTH DAILY AT BEDTIME AS NEEDED    . triamcinolone cream (KENALOG) 0.1 % Apply topically.    . Turmeric Curcumin 500 MG CAPS Take by mouth.    . zoledronic acid (RECLAST) 5 MG/100ML SOLN injection Inject into the vein. Reported on 04/01/2016     No current facility-administered medications for this visit.    Review of Systems:  GENERAL:  Limited energy, but improving.  No fevers or sweats.  Weight down 1 pound. PERFORMANCE STATUS (ECOG):  2 HEENT:  No visual changes, runny nose, sore throat, mouth sores or tenderness. Lungs: No shortness of breath or cough.  No hemoptysis. Cardiac:  No chest pain, palpitations, orthopnea, or PND. GI:  Diarrhea resolved after stopping magnesium.  No nausea, vomiting, diarrhea, constipation, melena or hematochezia. GU:  No urgency, frequency, dysuria, or hematuria. Musculoskeletal:  No back pain.  No joint pain.  No muscle tenderness. Extremities:  Swelling in ankles. Skin:  No rashes or skin changes. Neuro:  Balance issues, slightly better.  No headache, numbness or weakness or coordination issues. Endocrine:  No diabetes, thyroid issues, hot flashes or night sweats.  Dr. Astrid Divine monitoring thyroid function. Psych:  No mood changes, depression or anxiety. Pain:  No focal pain. Review of systems:  All other systems reviewed and found to be negative.  Physical Exam: Blood pressure 143/80, pulse 61, temperature 95.9 F (35.5 C), temperature source Tympanic, resp. rate 17, height 5' 5"  (1.651 m), weight 110 lb 3.7 oz (50 kg). GENERAL:  Thin elderly woman sitting comfortably in the exam room in no acute distress. MENTAL STATUS:  Alert and oriented to person, place and time. HEAD:  Curly gray hair.  Normocephalic, atraumatic, face  symmetric, no Cushingoid features. EYES:  Glasses.  Blue eyes.  Pupils equal round and reactive to light and accomodation.  No conjunctivitis or scleral icterus. ENT:  Oropharynx clear without lesion.  Tongue normal. Mucous membranes moist.  RESPIRATORY:  Clear to auscultation without rales, wheezes or rhonchi. CARDIOVASCULAR:  Regular rate and rhythm without murmur, rub or gallop. ABDOMEN:  Soft, non-tender, with active bowel sounds, and no hepatosplenomegaly.  No masses. SKIN:  No rashes, ulcers or lesions. EXTREMITIES: No edema, no skin discoloration or tenderness.  No palpable cords. LYMPH NODES: No palpable cervical, supraclavicular, axillary or inguinal adenopathy  NEUROLOGICAL: Unremarkable. PSYCH:  Appropriate.  Appointment on 04/01/2016  Component Date Value Ref Range Status  . WBC 04/01/2016 5.5  3.6 - 11.0 K/uL Final  . RBC 04/01/2016 4.00  3.80 - 5.20 MIL/uL Final  . Hemoglobin 04/01/2016 13.1  12.0 - 16.0 g/dL Final  . HCT 04/01/2016 37.9  35.0 - 47.0 % Final  . MCV 04/01/2016 94.9  80.0 - 100.0 fL Final  . MCH 04/01/2016 32.8  26.0 - 34.0 pg Final  . MCHC 04/01/2016 34.6  32.0 - 36.0 g/dL Final  . RDW 04/01/2016 12.8  11.5 - 14.5 % Final  . Platelets 04/01/2016 227  150 - 440 K/uL Final  . Neutrophils Relative % 04/01/2016 74   Final  . Neutro Abs 04/01/2016 4.1  1.4 - 6.5 K/uL Final  . Lymphocytes Relative 04/01/2016 13   Final  . Lymphs Abs 04/01/2016 0.7* 1.0 - 3.6 K/uL Final  . Monocytes Relative 04/01/2016 10   Final  . Monocytes Absolute 04/01/2016 0.5  0.2 - 0.9 K/uL Final  . Eosinophils Relative 04/01/2016 2   Final  . Eosinophils Absolute 04/01/2016 0.1  0 - 0.7 K/uL Final  . Basophils Relative 04/01/2016 1   Final  . Basophils Absolute 04/01/2016 0.1  0 - 0.1 K/uL Final  . Sodium 04/01/2016 133* 135 - 145 mmol/L Final  . Potassium 04/01/2016 4.2  3.5 - 5.1 mmol/L Final  . Chloride 04/01/2016 102  101 - 111 mmol/L Final  . CO2 04/01/2016 28  22 - 32 mmol/L  Final  . Glucose, Bld 04/01/2016 89  65 - 99 mg/dL Final  . BUN 04/01/2016 22* 6 - 20 mg/dL Final  . Creatinine, Ser 04/01/2016 0.85  0.44 - 1.00 mg/dL Final  . Calcium 04/01/2016 8.9  8.9 - 10.3 mg/dL Final  . Total Protein 04/01/2016 6.3* 6.5 - 8.1 g/dL Final  . Albumin 04/01/2016 4.2  3.5 - 5.0 g/dL Final  . AST 04/01/2016 20  15 - 41 U/L Final  . ALT 04/01/2016 21  14 - 54 U/L Final  . Alkaline Phosphatase 04/01/2016 73  38 - 126 U/L Final  . Total Bilirubin 04/01/2016 0.8  0.3 - 1.2 mg/dL Final  . GFR calc non Af Amer 04/01/2016 >60  >60  mL/min Final  . GFR calc Af Amer 04/01/2016 >60  >60 mL/min Final   Comment: (NOTE) The eGFR has been calculated using the CKD EPI equation. This calculation has not been validated in all clinical situations. eGFR's persistently <60 mL/min signify possible Chronic Kidney Disease.   . Anion gap 04/01/2016 3* 5 - 15 Final  . LDH 04/01/2016 134  98 - 192 U/L Final    Assessment:  [name] is a 82 y.o. female with a history of stage IIIA follicular non-Hodgkin's lymphoma.  She presented in the summer of 2003 with an epigastric and left upper quadrant mass and left arm weakness with decreased mobility. Abdominal ultrasound revealed a large mass in the head of the pancreas with  multiple enlarged lymph nodes and a large lobulated mass in the right retroperitoneal area.  MRI of the left brachial plexus revealed a 2.5 cm soft tissue mass and abnormal lymph node along the left brachial plexus and in the supraclavicular region.  Cervical node biopsy on 08/06/2002 revealed a grade I follicular non-Hodgkin's lymphoma.  Bone marrow on 08/09/2002 was negative.  Lumbar puncture was negative.  She received Leukeran and Rituxan. As her arm pain and mobility was not relieved, she received local radiation to the left brachial plexus area. Leukeran continued through 02/28/2013 (discontinued secondary side effects).  She developed progressive disease in 10/2003.  Patient received CVP chemotherapy beginning 01/02/2004. In 06/2004 she was switched to CNOP.  She received 8 cycles completing on 01/15/2005.  She began maintenance Rituxan on 04/23/2004 (4 weekly doses every 3 months for 2 years). Therapy completed on 09/13/2008.  Restaging chest, abdomen, and pelvic CT scan on 09/02/2013 revealed no significant interval change. She has some mildly enlarged intra-aortic caval retroperitoneal nodes measuring 1.6 x 1.2 cm. There were stable subcentimeter retroperitoneal nodes.  Chest, abdomen, and pelvic CT cans on 09/07/2014 revealed no evidence of lymphoma recurrence.  Chest, abdomen, and pelvic CT scan on 09/26/2015 revealed stable portacaval (2.5 cm) and aortocaval (1.0 cm) lymph nodes and mild hepatomegaly.    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  She had a complicated course with acute renal failure and anemia.  She was then in rehab for 3 weeks.  She continues to work on her balance.  Symptomatically, she denies any B symptoms.  Exam reveals no adenopathy or hepatosplenomegaly.  Plan: 1.  Labs today:  CBC with diff, CMP, LDH, uric acid. 2.  RTC in 6 months for MD assessment and labs (CBC with diff, CMP, LDH, uric acid, IgG level)   Lequita Asal, MD  04/01/2016, 10:52 AM

## 2016-04-22 ENCOUNTER — Encounter: Payer: Self-pay | Admitting: Hematology and Oncology

## 2016-09-15 ENCOUNTER — Ambulatory Visit (INDEPENDENT_AMBULATORY_CARE_PROVIDER_SITE_OTHER): Payer: Medicare Other | Admitting: Vascular Surgery

## 2016-09-22 ENCOUNTER — Encounter (INDEPENDENT_AMBULATORY_CARE_PROVIDER_SITE_OTHER): Payer: Self-pay | Admitting: Vascular Surgery

## 2016-09-22 ENCOUNTER — Ambulatory Visit (INDEPENDENT_AMBULATORY_CARE_PROVIDER_SITE_OTHER): Payer: Medicare Other | Admitting: Vascular Surgery

## 2016-09-22 ENCOUNTER — Other Ambulatory Visit (INDEPENDENT_AMBULATORY_CARE_PROVIDER_SITE_OTHER): Payer: Self-pay | Admitting: Vascular Surgery

## 2016-09-22 ENCOUNTER — Ambulatory Visit (INDEPENDENT_AMBULATORY_CARE_PROVIDER_SITE_OTHER): Payer: Medicare Other

## 2016-09-22 VITALS — BP 130/70 | HR 66 | Resp 16 | Ht 64.5 in | Wt 110.0 lb

## 2016-09-22 DIAGNOSIS — I890 Lymphedema, not elsewhere classified: Secondary | ICD-10-CM | POA: Diagnosis not present

## 2016-09-22 DIAGNOSIS — I722 Aneurysm of renal artery: Secondary | ICD-10-CM

## 2016-09-22 DIAGNOSIS — E7800 Pure hypercholesterolemia, unspecified: Secondary | ICD-10-CM | POA: Diagnosis not present

## 2016-09-22 NOTE — Progress Notes (Signed)
Subjective:    Patient ID: [name], female    DOB: [birth year], 82 y.o.   MRN: [id number] Chief Complaint  Patient presents with  . Follow-up    ultrasound    Patient last seen on 09/15/16 in follow up for her right renal artery aneurysm. She presents today to review studies. She underwent a bilateral renal duplex which showed no stenosis and a right renal artery measuring 25m. CT one year ago notable for a 860maneurysm. Patient continues to deny any renal or hypertensive issues.    Review of Systems  Constitutional: Negative.   HENT: Negative.   Eyes: Negative.   Respiratory: Negative.   Cardiovascular: Positive for leg swelling.  Gastrointestinal: Negative.   Endocrine: Negative.   Genitourinary: Negative.   Musculoskeletal: Negative.   Skin: Negative.   Allergic/Immunologic: Negative.   Neurological: Negative.   Hematological: Negative.   Psychiatric/Behavioral: Negative.       Objective:   Physical Exam  Constitutional: She is oriented to person, place, and time. She appears well-developed and well-nourished.  HENT:  Head: Normocephalic and atraumatic.  Eyes: Conjunctivae are normal. Pupils are equal, round, and reactive to light.  Neck: Normal range of motion.  Cardiovascular: Normal rate, regular rhythm, normal heart sounds and intact distal pulses.   Pulses:      Popliteal pulses are 2+ on the right side, and 2+ on the left side.       Dorsalis pedis pulses are 2+ on the right side, and 2+ on the left side.       Posterior tibial pulses are 2+ on the right side, and 2+ on the left side.  Pulmonary/Chest: Effort normal and breath sounds normal.  Abdominal: Soft. She exhibits no abdominal bruit.  Musculoskeletal: Normal range of motion. She exhibits edema (Bilateral. Mild. ).  Neurological: She is alert and oriented to person, place, and time. She has normal reflexes.  Skin: Skin is warm and dry.  Psychiatric: She has a normal mood and affect. Her  behavior is normal. Judgment and thought content normal.   BP 130/70   Pulse 66   Resp 16   Ht 5' 4.5" (1.638 m)   Wt 110 lb (49.9 kg)   BMI 18.59 kg/m   Past [facility] History:  [facility]osis Date  [facility]   Non-Hodgkin Lymphoma with chemo and rad tx  . Follicular lymphoma grade I ([facility]08/23/2003  . Hemolytic uremic syndrome ([facility]08/29/2016   (E coli 0157)  . Meniere's disease    with bilateral hearing loss  . [facility]   [facility]  . Osteoporosis    Social History   Social History  . Marital status: Married    Spouse name: N/A  . Number of children: N/A  . Years of education: N/A   Occupational History  . Not on file.   Social History Main Topics  . Smoking status: Never Smoker  . Smokeless tobacco: Not on file  . Alcohol use No  . Drug use: No  . Sexual activity: Not on file   Other Topics Concern  . Not on file   Social History Narrative  . No narrative on file   Past Surgical History:  Procedure Laterality Date  . BONE MARROW BIOPSY  08/09/2002  . BREAST LUMPECTOMY  1950s   benign  . CATARACT EXTRACTION  03/2009  . LYMPH NODE BIOPSY  08/07/2002   cervical  . TUBAL LIGATION     Family  History  Problem Relation Age of Onset  . Cancer Father     Throat  . Cancer Sister 32    Colon   Allergies  Allergen Reactions  . Lisinopril Cough      Assessment & Plan:  Patient last seen on 09/15/16 in follow up for her right renal artery aneurysm. She presents today to review studies. She underwent a bilateral renal duplex which showed no stenosis and a right renal artery measuring 14m. CT one year ago notable for a 816maneurysm. Patient continues to deny any renal or hypertensive issues.   1. Hypercholesterolemia - Stable On ASA for medical optimization. Diet controlled. Encouraged good control as its slows the progression of atherosclerotic disease  2. Lymphedema - Stable Continue compression and elevation.  3. Aneurysm of renal  artery (HCC) - Stable No growth in aneurysm in last year. No HTN or renal issues. Patient to follow up in one year with renal duplex.  I have discussed with the patient at length the risk factors for and pathogenesis of atherosclerotic disease and encouraged a healthy diet, regular exercise regimen and blood pressure / glucose control.   Current Outpatient Prescriptions on File Prior to Visit  Medication Sig Dispense Refill  . acetaminophen (TYLENOL) 325 MG tablet Take by mouth.    . Marland Kitchenspirin 81 MG chewable tablet Chew by mouth.    . calcium-vitamin D (CALCIUM 500/D) 500-200 MG-UNIT tablet Take by mouth.    . famotidine (PEPCID) 20 MG tablet Take by mouth.    . Multiple Vitamin (MULTI-VITAMINS) TABS Take by mouth.    . triamcinolone cream (KENALOG) 0.1 % Apply topically.    . Turmeric Curcumin 500 MG CAPS Take by mouth.    . zoledronic acid (RECLAST) 5 MG/100ML SOLN injection Inject into the vein. Reported on 04/01/2016    . traZODone (DESYREL) 50 MG tablet TAKE 1 TO 2 TABLETS BY MOUTH DAILY AT BEDTIME AS NEEDED     No current facility-administered medications on file prior to visit.     There are no Patient Instructions on file for this visit. Return in about 1 year (around 09/22/2017) for Renal Artery Duplex.   Kawanda Drumheller A Audreanna Torrisi, PA-C

## 2016-10-02 ENCOUNTER — Other Ambulatory Visit: Payer: Medicare Other

## 2016-10-02 ENCOUNTER — Ambulatory Visit: Payer: Medicare Other | Admitting: Hematology and Oncology

## 2016-10-03 ENCOUNTER — Inpatient Hospital Stay (HOSPITAL_BASED_OUTPATIENT_CLINIC_OR_DEPARTMENT_OTHER): Payer: Medicare Other | Admitting: Internal Medicine

## 2016-10-03 ENCOUNTER — Other Ambulatory Visit: Payer: Self-pay | Admitting: *Deleted

## 2016-10-03 ENCOUNTER — Inpatient Hospital Stay: Payer: Medicare Other | Attending: Internal Medicine

## 2016-10-03 VITALS — BP 115/52 | HR 54 | Temp 96.1°F | Resp 18 | Wt 110.9 lb

## 2016-10-03 DIAGNOSIS — M818 Other osteoporosis without current pathological fracture: Secondary | ICD-10-CM | POA: Insufficient documentation

## 2016-10-03 DIAGNOSIS — Z8572 Personal history of non-Hodgkin lymphomas: Secondary | ICD-10-CM | POA: Insufficient documentation

## 2016-10-03 DIAGNOSIS — Z808 Family history of malignant neoplasm of other organs or systems: Secondary | ICD-10-CM | POA: Diagnosis not present

## 2016-10-03 DIAGNOSIS — Z9221 Personal history of antineoplastic chemotherapy: Secondary | ICD-10-CM | POA: Insufficient documentation

## 2016-10-03 DIAGNOSIS — Z8619 Personal history of other infectious and parasitic diseases: Secondary | ICD-10-CM

## 2016-10-03 DIAGNOSIS — R160 Hepatomegaly, not elsewhere classified: Secondary | ICD-10-CM

## 2016-10-03 DIAGNOSIS — Z7982 Long term (current) use of aspirin: Secondary | ICD-10-CM

## 2016-10-03 DIAGNOSIS — C8200 Follicular lymphoma grade I, unspecified site: Secondary | ICD-10-CM

## 2016-10-03 DIAGNOSIS — Z923 Personal history of irradiation: Secondary | ICD-10-CM | POA: Insufficient documentation

## 2016-10-03 DIAGNOSIS — C8203 Follicular lymphoma grade I, intra-abdominal lymph nodes: Secondary | ICD-10-CM

## 2016-10-03 DIAGNOSIS — Z122 Encounter for screening for malignant neoplasm of respiratory organs: Secondary | ICD-10-CM

## 2016-10-03 DIAGNOSIS — H8103 Meniere's disease, bilateral: Secondary | ICD-10-CM | POA: Insufficient documentation

## 2016-10-03 DIAGNOSIS — F17210 Nicotine dependence, cigarettes, uncomplicated: Secondary | ICD-10-CM

## 2016-10-03 DIAGNOSIS — Z8673 Personal history of transient ischemic attack (TIA), and cerebral infarction without residual deficits: Secondary | ICD-10-CM

## 2016-10-03 DIAGNOSIS — Z800 Family history of malignant neoplasm of digestive organs: Secondary | ICD-10-CM | POA: Insufficient documentation

## 2016-10-03 DIAGNOSIS — Z79899 Other long term (current) drug therapy: Secondary | ICD-10-CM

## 2016-10-03 LAB — COMPREHENSIVE METABOLIC PANEL
ALT: 21 U/L (ref 14–54)
AST: 23 U/L (ref 15–41)
Albumin: 4.4 g/dL (ref 3.5–5.0)
Alkaline Phosphatase: 70 U/L (ref 38–126)
Anion gap: 9 (ref 5–15)
BUN: 17 mg/dL (ref 6–20)
CO2: 27 mmol/L (ref 22–32)
Calcium: 9.2 mg/dL (ref 8.9–10.3)
Chloride: 98 mmol/L — ABNORMAL LOW (ref 101–111)
Creatinine, Ser: 0.6 mg/dL (ref 0.44–1.00)
GFR calc Af Amer: >60 mL/min (ref >60)
GFR calc non Af Amer: >60 mL/min (ref >60)
Glucose, Bld: 97 mg/dL (ref 65–99)
Potassium: 3.9 mmol/L (ref 3.5–5.1)
Sodium: 134 mmol/L — ABNORMAL LOW (ref 135–145)
Total Bilirubin: 1 mg/dL (ref 0.3–1.2)
Total Protein: 6.5 g/dL (ref 6.5–8.1)

## 2016-10-03 LAB — CBC WITH DIFFERENTIAL/PLATELET
Basophils Absolute: 0.1 10*3/uL (ref 0–0.1)
Basophils Relative: 1 %
Eosinophils Absolute: 0.1 10*3/uL (ref 0–0.7)
Eosinophils Relative: 2 %
HCT: 39.5 % (ref 35.0–47.0)
Hemoglobin: 13.6 g/dL (ref 12.0–16.0)
Lymphocytes Relative: 12 %
Lymphs Abs: 0.7 10*3/uL — ABNORMAL LOW (ref 1.0–3.6)
MCH: 32.4 pg (ref 26.0–34.0)
MCHC: 34.4 g/dL (ref 32.0–36.0)
MCV: 94.3 fL (ref 80.0–100.0)
Monocytes Absolute: 0.5 10*3/uL (ref 0.2–0.9)
Monocytes Relative: 9 %
Neutro Abs: 4.3 10*3/uL (ref 1.4–6.5)
Neutrophils Relative %: 76 %
Platelets: 235 10*3/uL (ref 150–440)
RBC: 4.19 MIL/uL (ref 3.80–5.20)
RDW: 12.9 % (ref 11.5–14.5)
WBC: 5.7 10*3/uL (ref 3.6–11.0)

## 2016-10-03 LAB — LACTATE DEHYDROGENASE: LDH: 133 U/L (ref 98–192)

## 2016-10-03 LAB — URIC ACID: Uric Acid, Serum: 4.3 mg/dL (ref 2.3–6.6)

## 2016-10-03 NOTE — Progress Notes (Signed)
Patient is here for follow up, she has no complaints  

## 2016-10-04 LAB — IGG: IgG (Immunoglobin G), Serum: 385 mg/dL — ABNORMAL LOW (ref 700–1600)

## 2016-10-14 NOTE — Progress Notes (Signed)
[place]  [phone] [phone]  ID: GRACIA SAGGESE OB: 1932/11/21  MR#: [id number]  IOX#:[id number]  Patient Care Team: Gayland Curry, MD as PCP - General (Family Medicine)  CHIEF COMPLAINT: Follicular lymphoma grade I of intra-abdominal lymph nodes  HPI:  [name] is a 82 y.o. female with a history of stage IIIA follicular non-Hodgkin's lymphoma. She presented in the summer of 2003 with an epigastric and left upper quadrant mass and left arm weakness with decreased mobility. Abdominal ultrasound revealed a large mass in the head of the pancreas with  multiple enlarged lymph nodes and a large lobulated mass in the right retroperitoneal area.  MRI of the left brachial plexus revealed a 2.5 cm soft tissue mass and abnormal lymph node along the left brachial plexus and in the supraclavicular region.  Cervical node biopsy on 08/06/2002 revealed a grade I follicular non-Hodgkin's lymphoma.  Bone marrow on 08/09/2002 was negative.  Lumbar puncture was negative.  She received Leukeran and Rituxan. As her arm pain and mobility was not relieved, she received local radiation to the left brachial plexus area. Leukeran continued through 02/28/2013 (discontinued secondary side effects).  She developed progressive disease in 10/2003. Patient received CVP chemotherapy beginning 01/02/2004. In 06/2004 she was switched to CNOP.  She received 8 cycles completing on 01/15/2005.  She began maintenance Rituxan on 04/23/2004 (4 weekly doses every 3 months for 2 years). Therapy completed on 09/13/2008.  Restaging chest, abdomen, and pelvic CT scan on 09/02/2013 revealed no significant interval change. She has some mildly enlarged intra-aortic caval retroperitoneal nodes measuring 1.6 x 1.2 cm. There were stable subcentimeter retroperitoneal nodes.  Chest, abdomen, and pelvic CT cans on 09/07/2014 revealed no evidence of lymphoma recurrence.  Chest, abdomen, and  pelvic CT scan on 09/26/2015 revealed stable portacaval (2.5 cm) and aortocaval (1.0 cm) lymph nodes and mild hepatomegaly.    She was admitted to [place] from 08/15/2015 - 08/25/2015 with bloody diarrhea then hemolytic uremic syndrome related to E coli 0157.  She had a complicated course with acute renal failure and anemia.  She was then in rehab for 3 weeks.     INTERVAL HISTORY: She returns today for follow-up.  She was last seen in this clinic by Dr. Susy Manor in April 2017. At that time she was recovering from hemolytic uremic syndrome related to Escherichia coli infection. Since her visit today now she's been doing well with no further loss of weight appetite no diarrhea no abdominal pain no fevers no night sweats palpable lumps. She denies excessive fatigue. No easy bruising or bleeding. All other systems review is negative.  REVIEW OF SYSTEMS:   ROS  As per HPI. Otherwise, a complete review of systems is negative.  PAST MEDICAL HISTORY: Past Medical History:  Diagnosis Date  . Cancer [facility]) 2013   Non-Hodgkin Lymphoma with chemo and rad tx  . Follicular lymphoma grade I ([place]) 08/06/2002  . Hemolytic uremic syndrome ([place]) 08/13/2015   (E coli 0157)  . Meniere's disease    with bilateral hearing loss  . Mini stroke [facility])    summer 2014  . Osteoporosis     PAST SURGICAL HISTORY: Past Surgical History:  Procedure Laterality Date  . BONE MARROW BIOPSY  08/09/2002  . BREAST LUMPECTOMY  1950s   benign  . CATARACT EXTRACTION  03/2009  . LYMPH NODE BIOPSY  08/07/2002   cervical  . TUBAL LIGATION      FAMILY HISTORY: Family History  Problem Relation  Age of Onset  . Cancer Father     Throat  . Cancer Sister 35    Colon    ADVANCED DIRECTIVES (Y/N):  N  HEALTH MAINTENANCE: Social History  Substance Use Topics  . Smoking status: Never Smoker  . Smokeless tobacco: Not on file  . Alcohol use No     Colonoscopy:  PAP:  Bone density:  Lipid panel:  Allergies    Allergen Reactions  . Lisinopril Cough    Current Outpatient Prescriptions  Medication Sig Dispense Refill  . acetaminophen (TYLENOL) 325 MG tablet Take by mouth.    [name] aspirin 81 MG chewable tablet Chew by mouth.    . AZELASTINE & FLUTICASONE NA by Nasal route.    . calcium-vitamin D (CALCIUM 500/D) 500-200 MG-UNIT tablet Take by mouth.    . famotidine (PEPCID) 20 MG tablet Take by mouth.    . Multiple Vitamin (MULTI-VITAMINS) TABS Take by mouth.    . Multiple Vitamins-Minerals (PRESERVISION AREDS 2 PO) Take by mouth.    . Probiotic Product (PROBIOTIC DAILY PO) Take by mouth.    . traZODone (DESYREL) 50 MG tablet TAKE 1 TO 2 TABLETS BY MOUTH DAILY AT BEDTIME AS NEEDED    . triamcinolone cream (KENALOG) 0.1 % Apply topically.    . Turmeric Curcumin 500 MG CAPS Take by mouth.    . zoledronic acid (RECLAST) 5 MG/100ML SOLN injection Inject into the vein. Reported on 04/01/2016     No current facility-administered medications for this visit.     OBJECTIVE: Vitals:   10/03/16 0946  BP: (!) 115/52  Pulse: (!) 54  Resp: 18  Temp: (!) 96.1 F (35.6 C)     Body mass index is 18.75 kg/m.   1.51 meters squared  ECOG FS:2 - Symptomatic, <50% confined to bed  Physical Exam. Physical exam   shows her to be alert awake oriented 3 no skin rashes no petechiae no palpable lymph nodes in the neck supraclavicular axillary or groin areas. Abdomen shows no hepatosplenomegaly bowel sounds are normal extremities are without edema.  Her blood pressure today is 115/52 pulse is 54 regular respirations are 18 temperature is 96.1. His CBC shows a normal white cell count hemoglobin 13.6 platelets 235.  CMP and LDH are within normal limits today.  Her last CT scan was from October 2016 which showed no evidence of lymphoma progression with stable portacaval and I aortocaval lymph nodes.     LAB RESULTS:  Lab Results  Component Value Date   NA 134 (L) 10/03/2016   K 3.9 10/03/2016   CL 98 (L)  10/03/2016   CO2 27 10/03/2016   GLUCOSE 97 10/03/2016   BUN 17 10/03/2016   CREATININE 0.60 10/03/2016   CALCIUM 9.2 10/03/2016   PROT 6.5 10/03/2016   ALBUMIN 4.4 10/03/2016   AST 23 10/03/2016   ALT 21 10/03/2016   ALKPHOS 70 10/03/2016   BILITOT 1.0 10/03/2016   GFRNONAA >60 10/03/2016   GFRAA >60 10/03/2016    Lab Results  Component Value Date   WBC 5.7 10/03/2016   NEUTROABS 4.3 10/03/2016   HGB 13.6 10/03/2016   HCT 39.5 10/03/2016   MCV 94.3 10/03/2016   PLT 235 10/03/2016      ASSESSMENT:   Personal history of stage IIIa follicular non-Hodgkin's lymphoma in remission since completion of maintenance Rituxan in September 2009. There appears to be no suspicion for lymphoma progression at this time clinically. Reviewed labs and last CAT scans with her. Return  for follow-up in one year with Dr. Susy Manor call for any new symptoms in the interval.       PLAN:    Patient expressed understanding and was in agreement with this plan. She also understands that She can call clinic at any time with any questions, concerns, or complaints.       Creola Corn, MD   10/14/2016 12:26 PM

## 2017-03-13 ENCOUNTER — Other Ambulatory Visit: Payer: Self-pay | Admitting: Family Medicine

## 2017-03-13 DIAGNOSIS — M810 Age-related osteoporosis without current pathological fracture: Secondary | ICD-10-CM

## 2017-03-23 DIAGNOSIS — R9431 Abnormal electrocardiogram [ECG] [EKG]: Secondary | ICD-10-CM | POA: Insufficient documentation

## 2017-09-23 ENCOUNTER — Ambulatory Visit (INDEPENDENT_AMBULATORY_CARE_PROVIDER_SITE_OTHER): Payer: Medicare Other | Admitting: Vascular Surgery

## 2017-09-23 ENCOUNTER — Encounter (INDEPENDENT_AMBULATORY_CARE_PROVIDER_SITE_OTHER): Payer: Medicare Other

## 2017-09-28 ENCOUNTER — Encounter (INDEPENDENT_AMBULATORY_CARE_PROVIDER_SITE_OTHER): Payer: Medicare Other | Admitting: Vascular Surgery

## 2017-09-28 ENCOUNTER — Encounter (INDEPENDENT_AMBULATORY_CARE_PROVIDER_SITE_OTHER): Payer: Medicare Other

## 2017-10-02 ENCOUNTER — Encounter: Payer: Self-pay | Admitting: Hematology and Oncology

## 2017-10-02 ENCOUNTER — Inpatient Hospital Stay: Payer: Medicare Other | Attending: Hematology and Oncology

## 2017-10-02 ENCOUNTER — Inpatient Hospital Stay (HOSPITAL_BASED_OUTPATIENT_CLINIC_OR_DEPARTMENT_OTHER): Payer: Medicare Other | Admitting: Hematology and Oncology

## 2017-10-02 VITALS — BP 135/75 | HR 62 | Temp 97.5°F | Resp 20 | Ht 64.5 in | Wt 111.0 lb

## 2017-10-02 DIAGNOSIS — L988 Other specified disorders of the skin and subcutaneous tissue: Secondary | ICD-10-CM | POA: Diagnosis not present

## 2017-10-02 DIAGNOSIS — Z800 Family history of malignant neoplasm of digestive organs: Secondary | ICD-10-CM | POA: Insufficient documentation

## 2017-10-02 DIAGNOSIS — H918X3 Other specified hearing loss, bilateral: Secondary | ICD-10-CM

## 2017-10-02 DIAGNOSIS — H8109 Meniere's disease, unspecified ear: Secondary | ICD-10-CM | POA: Insufficient documentation

## 2017-10-02 DIAGNOSIS — Z7982 Long term (current) use of aspirin: Secondary | ICD-10-CM

## 2017-10-02 DIAGNOSIS — Z923 Personal history of irradiation: Secondary | ICD-10-CM | POA: Insufficient documentation

## 2017-10-02 DIAGNOSIS — C8238 Follicular lymphoma grade IIIa, lymph nodes of multiple sites: Secondary | ICD-10-CM

## 2017-10-02 DIAGNOSIS — Z8673 Personal history of transient ischemic attack (TIA), and cerebral infarction without residual deficits: Secondary | ICD-10-CM | POA: Diagnosis not present

## 2017-10-02 DIAGNOSIS — Z79899 Other long term (current) drug therapy: Secondary | ICD-10-CM

## 2017-10-02 DIAGNOSIS — M810 Age-related osteoporosis without current pathological fracture: Secondary | ICD-10-CM

## 2017-10-02 DIAGNOSIS — C8288 Other types of follicular lymphoma, lymph nodes of multiple sites: Secondary | ICD-10-CM

## 2017-10-02 DIAGNOSIS — Z9221 Personal history of antineoplastic chemotherapy: Secondary | ICD-10-CM

## 2017-10-02 LAB — COMPREHENSIVE METABOLIC PANEL
ALT: 24 U/L (ref 14–54)
AST: 22 U/L (ref 15–41)
Albumin: 4 g/dL (ref 3.5–5.0)
Alkaline Phosphatase: 77 U/L (ref 38–126)
Anion gap: 9 (ref 5–15)
BUN: 18 mg/dL (ref 6–20)
CO2: 27 mmol/L (ref 22–32)
Calcium: 9.3 mg/dL (ref 8.9–10.3)
Chloride: 98 mmol/L — ABNORMAL LOW (ref 101–111)
Creatinine, Ser: 0.59 mg/dL (ref 0.44–1.00)
GFR calc Af Amer: >60 mL/min (ref >60)
GFR calc non Af Amer: >60 mL/min (ref >60)
Glucose, Bld: 91 mg/dL (ref 65–99)
Potassium: 4.2 mmol/L (ref 3.5–5.1)
Sodium: 134 mmol/L — ABNORMAL LOW (ref 135–145)
Total Bilirubin: 0.9 mg/dL (ref 0.3–1.2)
Total Protein: 6.2 g/dL — ABNORMAL LOW (ref 6.5–8.1)

## 2017-10-02 LAB — CBC WITH DIFFERENTIAL/PLATELET
Basophils Absolute: 0.1 10*3/uL (ref 0–0.1)
Basophils Relative: 1 %
Eosinophils Absolute: 0.1 10*3/uL (ref 0–0.7)
Eosinophils Relative: 1 %
HCT: 39.8 % (ref 35.0–47.0)
Hemoglobin: 13.5 g/dL (ref 12.0–16.0)
Lymphocytes Relative: 11 %
Lymphs Abs: 0.6 10*3/uL — ABNORMAL LOW (ref 1.0–3.6)
MCH: 32.6 pg (ref 26.0–34.0)
MCHC: 33.8 g/dL (ref 32.0–36.0)
MCV: 96.6 fL (ref 80.0–100.0)
Monocytes Absolute: 0.6 10*3/uL (ref 0.2–0.9)
Monocytes Relative: 11 %
Neutro Abs: 4.3 10*3/uL (ref 1.4–6.5)
Neutrophils Relative %: 76 %
Platelets: 250 10*3/uL (ref 150–440)
RBC: 4.12 MIL/uL (ref 3.80–5.20)
RDW: 13.3 % (ref 11.5–14.5)
WBC: 5.7 10*3/uL (ref 3.6–11.0)

## 2017-10-02 LAB — LACTATE DEHYDROGENASE: LDH: 135 U/L (ref 98–192)

## 2017-10-02 NOTE — Progress Notes (Signed)
[place] Clinic day:  10/02/17  Chief Complaint: [name] is a 83 y.o. female with a history of stage IIIA follicular non-Hodgkin's lymphoma who is seen for 6 month assessment.  HPI: The patient was last seen in the medical oncology clinic by me on 04/01/2016.  At that time, she was recovering from hemolytic uremic syndrome (HUS).  She denied any B symptoms.  Exam revealed no adenopathy or hepatosplenomegaly.  She was seen by Dr. Sherrine Maples on 10/03/2016 in my absence.  She denied any complaints.  Exam and labs were unremarkable.  During the interim, patient doing well. Patient denies any physical complaints. Patient has had no fevers or sweats. Patient with recently diagnosed renal artery aneurysm. She is being followed by cardiology. SBP initially in the 150; down to 135 on recheck.   Patient with a RIGHT preauricular lesion that is enlarging. Area first appreciated about 2-3 years ago. She is seeing dermatology for excision and biopsy on 10/12/2017. Patient notes that she has lost some weight following her HUS infection. Patient eating well.    Past Medical History:  Diagnosis Date  . Cancer [facility]) 2013   Non-Hodgkin Lymphoma with chemo and rad tx  . Follicular lymphoma grade I ([place]) 08/06/2002  . Hemolytic uremic syndrome ([place]) 08/13/2015   (E coli 0157)  . Meniere's disease    with bilateral hearing loss  . Mini stroke [facility])    summer 2014  . Osteoporosis     Past Surgical History:  Procedure Laterality Date  . BONE MARROW BIOPSY  08/09/2002  . BREAST LUMPECTOMY  1950s   benign  . CATARACT EXTRACTION  03/2009  . LYMPH NODE BIOPSY  08/07/2002   cervical  . TUBAL LIGATION      Family History  Problem Relation Age of Onset  . Cancer Father        Throat  . Cancer Sister 51       Colon    Social History:  reports that she has never smoked. She does not have any smokeless tobacco history on file. She reports that she  does not drink alcohol or use drugs.  The patient is accompanied by her husband today.  Allergies:  Allergies  Allergen Reactions  . Lisinopril Cough    Current Medications: Current Outpatient Prescriptions  Medication Sig Dispense Refill  . Acetaminophen (TYLENOL ARTHRITIS EXT RELIEF PO) Take 1 tablet by mouth daily as needed (pain).    [name] aspirin 81 MG chewable tablet Chew 81 mg by mouth daily.     . AZELASTINE & FLUTICASONE NA 1 spray each nase twice daily.    . calcium-vitamin D (CALCIUM 500/D) 500-200 MG-UNIT tablet Take 1 tablet by mouth 2 (two) times daily.     . famotidine (PEPCID) 20 MG tablet Take 20 mg by mouth daily.     . Multiple Vitamin (MULTI-VITAMINS) TABS Take 1 tablet by mouth daily.     . Multiple Vitamins-Minerals (PRESERVISION AREDS 2 PO) Take 1 tablet by mouth daily.     . NON FORMULARY Take 1 tablet by mouth daily. Tumeric-root extract tablet    . Probiotic Product (PROBIOTIC DAILY PO) Take 2 tablets by mouth daily.     . traZODone (DESYREL) 50 MG tablet TAKE 1 TO 2 TABLETS BY MOUTH DAILY AT BEDTIME AS NEEDED    . triamcinolone cream (KENALOG) 0.1 % Apply 1 application topically 2 (two) times daily.     . Turmeric Curcumin 500 MG  CAPS Take 1 capsule by mouth daily.      No current facility-administered medications for this visit.     Review of Systems:  GENERAL:  Limited energy, but improving.  No fevers or sweats.  Weight up 1 pound. PERFORMANCE STATUS (ECOG):  2 HEENT:  No visual changes, runny nose, sore throat, mouth sores or tenderness. Lungs: No shortness of breath or cough.  No hemoptysis. Cardiac:  No chest pain, palpitations, orthopnea, or PND.  Heart flutters- sees Dr. Ubaldo Glassing. GI:  "Eating like crazy".  No nausea, vomiting, diarrhea, constipation, melena or hematochezia. GU:  No urgency, frequency, dysuria, or hematuria. Musculoskeletal:  No back pain.  No joint pain.  No muscle tenderness. Extremities:  Swelling in ankles. Skin:  Lesion in front of  right ear (see HPI).  No rashes or skin changes. Neuro:  Balance issues, slightly better.  No headache, numbness or weakness or coordination issues. Endocrine:  No diabetes, thyroid issues, hot flashes or night sweats.  Dr. Astrid Divine monitoring thyroid function. Psych:  No mood changes, depression or anxiety. Pain:  No focal pain. Review of systems:  All other systems reviewed and found to be negative.  Physical Exam: Blood pressure 135/75, pulse 62, temperature (!) 97.5 F (36.4 C), temperature source Tympanic, resp. rate 20, height 5' 4.5" (1.638 m), weight 111 lb (50.3 kg). GENERAL:  Thin elderly woman sitting comfortably in the exam room in no acute distress. MENTAL STATUS:  Alert and oriented to person, place and time. HEAD:  Curly gray hair.  Normocephalic, atraumatic, face symmetric, no Cushingoid features. EYES:  Glasses.  Blue eyes.  Pupils equal round and reactive to light and accomodation.  No conjunctivitis or scleral icterus. ENT:  Oropharynx clear without lesion.  Tongue normal. Mucous membranes moist.  RESPIRATORY:  Clear to auscultation without rales, wheezes or rhonchi. CARDIOVASCULAR:  Regular rate and rhythm without murmur, rub or gallop. ABDOMEN:  Soft, non-tender, with active bowel sounds, and no hepatosplenomegaly.  No masses. SKIN:  1 cm SQ cystic lesion right preauricular area.  No rashes, ulcers or lesions. EXTREMITIES: No edema, no skin discoloration or tenderness.  No palpable cords. LYMPH NODES: No palpable cervical, supraclavicular, axillary or inguinal adenopathy  NEUROLOGICAL: Unremarkable. PSYCH:  Appropriate.   Appointment on 10/02/2017  Component Date Value Ref Range Status  . WBC 10/02/2017 5.7  3.6 - 11.0 K/uL Final  . RBC 10/02/2017 4.12  3.80 - 5.20 MIL/uL Final  . Hemoglobin 10/02/2017 13.5  12.0 - 16.0 g/dL Final  . HCT 10/02/2017 39.8  35.0 - 47.0 % Final  . MCV 10/02/2017 96.6  80.0 - 100.0 fL Final  . MCH 10/02/2017 32.6  26.0 - 34.0 pg Final   . MCHC 10/02/2017 33.8  32.0 - 36.0 g/dL Final  . RDW 10/02/2017 13.3  11.5 - 14.5 % Final  . Platelets 10/02/2017 250  150 - 440 K/uL Final  . Neutrophils Relative % 10/02/2017 76  % Final  . Neutro Abs 10/02/2017 4.3  1.4 - 6.5 K/uL Final  . Lymphocytes Relative 10/02/2017 11  % Final  . Lymphs Abs 10/02/2017 0.6* 1.0 - 3.6 K/uL Final  . Monocytes Relative 10/02/2017 11  % Final  . Monocytes Absolute 10/02/2017 0.6  0.2 - 0.9 K/uL Final  . Eosinophils Relative 10/02/2017 1  % Final  . Eosinophils Absolute 10/02/2017 0.1  0 - 0.7 K/uL Final  . Basophils Relative 10/02/2017 1  % Final  . Basophils Absolute 10/02/2017 0.1  0 - 0.1 K/uL  Final  . Sodium 10/02/2017 134* 135 - 145 mmol/L Final  . Potassium 10/02/2017 4.2  3.5 - 5.1 mmol/L Final  . Chloride 10/02/2017 98* 101 - 111 mmol/L Final  . CO2 10/02/2017 27  22 - 32 mmol/L Final  . Glucose, Bld 10/02/2017 91  65 - 99 mg/dL Final  . BUN 10/02/2017 18  6 - 20 mg/dL Final  . Creatinine, Ser 10/02/2017 0.59  0.44 - 1.00 mg/dL Final  . Calcium 10/02/2017 9.3  8.9 - 10.3 mg/dL Final  . Total Protein 10/02/2017 6.2* 6.5 - 8.1 g/dL Final  . Albumin 10/02/2017 4.0  3.5 - 5.0 g/dL Final  . AST 10/02/2017 22  15 - 41 U/L Final  . ALT 10/02/2017 24  14 - 54 U/L Final  . Alkaline Phosphatase 10/02/2017 77  38 - 126 U/L Final  . Total Bilirubin 10/02/2017 0.9  0.3 - 1.2 mg/dL Final  . GFR calc non Af Amer 10/02/2017 >60  >60 mL/min Final  . GFR calc Af Amer 10/02/2017 >60  >60 mL/min Final   Comment: (NOTE) The eGFR has been calculated using the CKD EPI equation. This calculation has not been validated in all clinical situations. eGFR's persistently <60 mL/min signify possible Chronic Kidney Disease.   . Anion gap 10/02/2017 9  5 - 15 Final  . LDH 10/02/2017 135  98 - 192 U/L Final    Assessment:  SHER SHAMPINE is a 83 y.o. female with a history of stage IIIA follicular non-Hodgkin's lymphoma.  She presented in the summer of 2003  with an epigastric and left upper quadrant mass and left arm weakness with decreased mobility. Abdominal ultrasound revealed a large mass in the head of the pancreas with  multiple enlarged lymph nodes and a large lobulated mass in the right retroperitoneal area.  MRI of the left brachial plexus revealed a 2.5 cm soft tissue mass and abnormal lymph node along the left brachial plexus and in the supraclavicular region.  Cervical node biopsy on 08/06/2002 revealed a grade I follicular non-Hodgkin's lymphoma.  Bone marrow on 08/09/2002 was negative.  Lumbar puncture was negative.  She received Leukeran and Rituxan. As her arm pain and mobility was not relieved, she received local radiation to the left brachial plexus area. Leukeran continued through 02/28/2013 (discontinued secondary side effects).  She developed progressive disease in 10/2003. Patient received CVP chemotherapy beginning 01/02/2004. In 06/2004 she was switched to CNOP.  She received 8 cycles completing on 01/15/2005.  She began maintenance Rituxan on 04/23/2004 (4 weekly doses every 3 months for 2 years). Therapy completed on 09/13/2008.    Chest, abdomen, and pelvic CT on 09/02/2013 revealed no significant interval change. She has some mildly enlarged intra-aortic caval retroperitoneal nodes measuring 1.6 x 1.2 cm. There were stable subcentimeter retroperitoneal nodes.  Chest, abdomen, and pelvic CT on 09/07/2014 revealed no evidence of lymphoma recurrence.  Chest, abdomen, and pelvic CT on 09/26/2015 revealed stable portacaval (2.5 cm) and aortocaval (1.0 cm) lymph nodes and mild hepatomegaly.    She was admitted to [place] from 08/15/2015 - 08/25/2015 with bloody diarrhea then hemolytic uremic syndrome related to E coli 0157.  She had a complicated course with acute renal failure and anemia.  She was then in rehab for 3 weeks.  She continues to work on her balance.  Symptomatically, patient is doing well. She denies any B symptoms or major  concerns. Exam reveals no adenopathy or hepatosplenomegaly. She has a 1 cm cystic RIGHT preauricular lesion.  She is seeing dermatology for a planned excision and biopsy. Labs are unremarkable.   Plan: 1.  Labs today:  CBC with diff, CMP, LDH, uric acid. 2.  Follow up with cardiology for continuing evaluation of her renal artery aneurysm.  3.  Follow up with dermatology for removal of right preauricular lesion as already scheduled on 10/12/2017.  4.  RTC in 1 year for MD assessment and labs (CBC with diff, CMP, LDH, uric acid, IgG level).   Honor Loh, NP  10/02/2017, 9:53 AM   I saw and evaluated the patient, participating in the key portions of the service and reviewing pertinent diagnostic studies and records.  I reviewed the nurse practitioner's note and agree with the findings and the plan.  The assessment and plan were discussed with the patient.  A few questions were asked by the patient and answered.   Nolon Stalls, MD 10/02/2017,9:53 AM

## 2017-10-03 ENCOUNTER — Encounter: Payer: Self-pay | Admitting: Hematology and Oncology

## 2017-10-21 ENCOUNTER — Other Ambulatory Visit: Payer: Self-pay | Admitting: Urgent Care

## 2017-10-21 ENCOUNTER — Other Ambulatory Visit: Payer: Self-pay | Admitting: *Deleted

## 2017-10-21 ENCOUNTER — Encounter: Payer: Self-pay | Admitting: Hematology and Oncology

## 2017-10-21 ENCOUNTER — Ambulatory Visit (INDEPENDENT_AMBULATORY_CARE_PROVIDER_SITE_OTHER): Payer: Medicare Other

## 2017-10-21 ENCOUNTER — Ambulatory Visit (INDEPENDENT_AMBULATORY_CARE_PROVIDER_SITE_OTHER): Payer: Medicare Other | Admitting: Vascular Surgery

## 2017-10-21 ENCOUNTER — Encounter (INDEPENDENT_AMBULATORY_CARE_PROVIDER_SITE_OTHER): Payer: Self-pay | Admitting: Vascular Surgery

## 2017-10-21 VITALS — BP 130/66 | HR 61 | Resp 17 | Wt 109.0 lb

## 2017-10-21 DIAGNOSIS — I722 Aneurysm of renal artery: Secondary | ICD-10-CM | POA: Diagnosis not present

## 2017-10-21 DIAGNOSIS — I890 Lymphedema, not elsewhere classified: Secondary | ICD-10-CM | POA: Diagnosis not present

## 2017-10-21 DIAGNOSIS — C8200 Follicular lymphoma grade I, unspecified site: Secondary | ICD-10-CM | POA: Diagnosis not present

## 2017-10-21 NOTE — Progress Notes (Signed)
Subjective:    Patient ID: [name], female    DOB: 04-27-1932, 83 y.o.   MRN: [id number] Chief Complaint  Patient presents with  . Follow-up    49yrRenal   Patient presents for a yearly renal artery stenosis follow-up. She presents today without complaint. Most recent kidney function labs:: 10/02/2017 09:14 BUN: 18 Creatinine: 0.59 The patient states her hypertension is controlled. The patient underwent a renal artery duplex exam which was notable for patent bilateral renal arteries and veins. Right renal artery aneurysm previously mentioned a 6 mm in maximal diameter was not appreciated on today's exam due to overlying bowel gas. A 4.8 cm x 5.7 cm x 5.9 cm complex mass seen in the hepatic hilum. When compared to the previous exam on 09/22/2016 the right renal artery aneurysm was not appreciated. Patient states she is going to be undergoing a CT because her lymphoma has possibly returned. Patient denies any fever, nausea or vomiting.   Review of Systems  Constitutional: Negative.   HENT: Negative.   Eyes: Negative.   Respiratory: Negative.   Cardiovascular: Negative.   Gastrointestinal: Negative.   Endocrine: Negative.   Genitourinary: Negative.   Musculoskeletal: Negative.   Skin: Negative.   Allergic/Immunologic: Negative.   Neurological: Negative.   Hematological: Negative.   Psychiatric/Behavioral: Negative.       Objective:   Physical Exam  Constitutional: She is oriented to person, place, and time. She appears well-developed and well-nourished. No distress.  HENT:  Head: Normocephalic and atraumatic.  Eyes: Conjunctivae are normal. Pupils are equal, round, and reactive to light.  Neck: Normal range of motion.  Cardiovascular: Normal rate, regular rhythm, normal heart sounds and intact distal pulses.  Pulses:      Radial pulses are 2+ on the right side, and 2+ on the left side.  Pulmonary/Chest: Effort normal and breath sounds normal.  Musculoskeletal:  Normal range of motion. She exhibits no edema.  Neurological: She is alert and oriented to person, place, and time.  Skin: Skin is warm and dry. She is not diaphoretic.  Psychiatric: She has a normal mood and affect. Her behavior is normal. Judgment and thought content normal.  Vitals reviewed.  BP 130/66 (BP Location: Right Arm)   Pulse 61   Resp 17   Wt 109 lb (49.4 kg)   BMI 18.42 kg/m   Past Medical History:  Diagnosis Date  . Cancer ([facility] 2013   Non-Hodgkin Lymphoma with chemo and rad tx  . [place] [place]  . [place] 08[place]   (E coli 0157)  . Meniere's disease    with bilateral hearing loss  . Mini stroke ([facility]    summer 2014  . Osteoporosis    Social History   Socioeconomic History  . Marital status: Married    Spouse name: Not on file  . Number of children: Not on file  . Years of education: Not on file  . Highest education level: Not on file  Social Needs  . Financial resource strain: Not on file  . Food insecurity - worry: Not on file  . Food insecurity - inability: Not on file  . Transportation needs - medical: Not on file  . Transportation needs - non-medical: Not on file  Occupational History  . Not on file  Tobacco Use  . Smoking status: Never Smoker  . Smokeless tobacco: Never Used  Substance and Sexual Activity  . Alcohol use: No    Alcohol/week:  0.0 oz  . Drug use: No  . Sexual activity: Not on file  Other Topics Concern  . Not on file  Social History Narrative  . Not on file   Past Surgical History:  Procedure Laterality Date  . BONE MARROW BIOPSY  08/09/2002  . BREAST LUMPECTOMY  1950s   benign  . CATARACT EXTRACTION  03/2009  . LYMPH NODE BIOPSY  08/07/2002   cervical  . TUBAL LIGATION     Family History  Problem Relation Age of Onset  . Cancer Father        Throat  . Cancer Sister 60       Colon   Allergies  Allergen Reactions  . Lisinopril Cough      Assessment &  Plan:  Patient presents for a yearly renal artery stenosis follow-up. She presents today without complaint. Most recent kidney function labs:: 10/02/2017 09:14 BUN: 18 Creatinine: 0.59 The patient states her hypertension is controlled. The patient underwent a renal artery duplex exam which was notable for patent bilateral renal arteries and veins. Right renal artery aneurysm previously mentioned a 6 mm in maximal diameter was not appreciated on today's exam due to overlying bowel gas. A 4.8 cm x 5.7 cm x 5.9 cm complex mass seen in the hepatic hilum. When compared to the previous exam on 09/22/2016 the right renal artery aneurysm was not appreciated. Patient states she is going to be undergoing a CT because her lymphoma has possibly returned. Patient denies any fever, nausea or vomiting.  1. Aneurysm of renal artery (HCC) - Stable This was not visualized on today's duplex due to bowel gas I will ask my attending physician to review the images Once the images are reviewed I will call the patient No significant plaque noted in the bilateral renal arteries. Patient to follow up in one year  - VAS [place]; Future  2. Lymphedema - Stable At this time the patient is stable She is to continue wearing compression stockings and elevating her legs  3. Follicular lymphoma grade I, unspecified body region [facility]) - Stable Patient will be undergoing further imaging to assess if her lymphoma has returned  Current Outpatient Medications on File Prior to Visit  Medication Sig Dispense Refill  . Acetaminophen (TYLENOL ARTHRITIS EXT RELIEF PO) Take 1 tablet by mouth daily as needed (pain).    [name] aspirin 81 MG chewable tablet Chew 81 mg by mouth daily.     . AZELASTINE & FLUTICASONE NA 1 spray each nase twice daily.    . calcium-vitamin D (CALCIUM 500/D) 500-200 MG-UNIT tablet Take 1 tablet by mouth 2 (two) times daily.     . famotidine (PEPCID) 20 MG tablet Take 20 mg by mouth daily.     .  Multiple Vitamin (MULTI-VITAMINS) TABS Take 1 tablet by mouth daily.     . Multiple Vitamins-Minerals (PRESERVISION AREDS 2 PO) Take 1 tablet by mouth daily.     . NON FORMULARY Take 1 tablet by mouth daily. Tumeric-root extract tablet    . Probiotic Product (PROBIOTIC DAILY PO) Take 2 tablets by mouth daily.     . traZODone (DESYREL) 50 MG tablet TAKE 1 TO 2 TABLETS BY MOUTH DAILY AT BEDTIME AS NEEDED    . triamcinolone cream (KENALOG) 0.1 % Apply 1 application topically 2 (two) times daily.     . Turmeric Curcumin 500 MG CAPS Take 1 capsule by mouth daily.      No current facility-administered medications on file  prior to visit.    There are no Patient Instructions on file for this visit. No Follow-up on file.  KIMBERLY A STEGMAYER, PA-C

## 2017-10-27 ENCOUNTER — Ambulatory Visit
Admission: RE | Admit: 2017-10-27 | Discharge: 2017-10-27 | Disposition: A | Discharge status: Home / Self Care | Payer: Medicare Other | Source: Ambulatory Visit | Attending: Family Medicine | Admitting: Family Medicine

## 2017-10-27 DIAGNOSIS — M4186 Other forms of scoliosis, lumbar region: Secondary | ICD-10-CM | POA: Diagnosis not present

## 2017-10-27 DIAGNOSIS — M810 Age-related osteoporosis without current pathological fracture: Secondary | ICD-10-CM | POA: Insufficient documentation

## 2017-10-27 DIAGNOSIS — Z13820 Encounter for screening for osteoporosis: Secondary | ICD-10-CM | POA: Insufficient documentation

## 2017-10-29 ENCOUNTER — Ambulatory Visit: Admission: RE | Admit: 2017-10-29 | Payer: Medicare Other | Source: Ambulatory Visit

## 2017-10-29 ENCOUNTER — Ambulatory Visit
Admission: RE | Admit: 2017-10-29 | Discharge: 2017-10-29 | Disposition: A | Discharge status: Home / Self Care | Payer: Medicare Other | Source: Ambulatory Visit | Attending: Urgent Care | Admitting: Urgent Care

## 2017-10-29 DIAGNOSIS — C8200 Follicular lymphoma grade I, unspecified site: Secondary | ICD-10-CM | POA: Diagnosis present

## 2017-10-29 HISTORY — DX: Essential (primary) hypertension: I10

## 2017-10-29 MED ORDER — IOPAMIDOL (ISOVUE-300) INJECTION 61%
100.0000 mL | Freq: Once | INTRAVENOUS | Status: AC | PRN
  Administered 2017-10-29: 100 mL via INTRAVENOUS

## 2017-11-02 ENCOUNTER — Inpatient Hospital Stay: Payer: Medicare Other | Attending: Hematology and Oncology | Admitting: Hematology and Oncology

## 2017-11-02 ENCOUNTER — Inpatient Hospital Stay: Payer: Medicare Other

## 2017-11-02 VITALS — BP 147/80 | HR 73 | Temp 97.4°F | Wt 105.4 lb

## 2017-11-02 DIAGNOSIS — R112 Nausea with vomiting, unspecified: Secondary | ICD-10-CM | POA: Diagnosis not present

## 2017-11-02 DIAGNOSIS — M810 Age-related osteoporosis without current pathological fracture: Secondary | ICD-10-CM

## 2017-11-02 DIAGNOSIS — M25511 Pain in right shoulder: Secondary | ICD-10-CM

## 2017-11-02 DIAGNOSIS — C8200 Follicular lymphoma grade I, unspecified site: Secondary | ICD-10-CM

## 2017-11-02 DIAGNOSIS — R630 Anorexia: Secondary | ICD-10-CM

## 2017-11-02 DIAGNOSIS — Z9221 Personal history of antineoplastic chemotherapy: Secondary | ICD-10-CM

## 2017-11-02 DIAGNOSIS — Z923 Personal history of irradiation: Secondary | ICD-10-CM

## 2017-11-02 DIAGNOSIS — R634 Abnormal weight loss: Secondary | ICD-10-CM

## 2017-11-02 DIAGNOSIS — C8238 Follicular lymphoma grade IIIa, lymph nodes of multiple sites: Secondary | ICD-10-CM | POA: Diagnosis not present

## 2017-11-02 DIAGNOSIS — Z8673 Personal history of transient ischemic attack (TIA), and cerebral infarction without residual deficits: Secondary | ICD-10-CM

## 2017-11-02 DIAGNOSIS — Z79899 Other long term (current) drug therapy: Secondary | ICD-10-CM

## 2017-11-02 DIAGNOSIS — H8109 Meniere's disease, unspecified ear: Secondary | ICD-10-CM

## 2017-11-02 DIAGNOSIS — Z7982 Long term (current) use of aspirin: Secondary | ICD-10-CM | POA: Diagnosis not present

## 2017-11-02 DIAGNOSIS — I498 Other specified cardiac arrhythmias: Secondary | ICD-10-CM

## 2017-11-02 DIAGNOSIS — I10 Essential (primary) hypertension: Secondary | ICD-10-CM | POA: Diagnosis not present

## 2017-11-02 LAB — CBC WITH DIFFERENTIAL/PLATELET
BASOS PCT: 1 %
Basophils Absolute: 0.1 10*3/uL (ref 0–0.1)
EOS ABS: 0 10*3/uL (ref 0–0.7)
Eosinophils Relative: 0 %
HCT: 40.4 % (ref 35.0–47.0)
Hemoglobin: 13.7 g/dL (ref 12.0–16.0)
Lymphocytes Relative: 3 %
Lymphs Abs: 0.3 10*3/uL — ABNORMAL LOW (ref 1.0–3.6)
MCH: 32.6 pg (ref 26.0–34.0)
MCHC: 33.8 g/dL (ref 32.0–36.0)
MCV: 96.3 fL (ref 80.0–100.0)
MONO ABS: 0.8 10*3/uL (ref 0.2–0.9)
MONOS PCT: 9 %
Neutro Abs: 8.2 10*3/uL — ABNORMAL HIGH (ref 1.4–6.5)
Neutrophils Relative %: 87 %
PLATELETS: 240 10*3/uL (ref 150–440)
RBC: 4.2 MIL/uL (ref 3.80–5.20)
RDW: 12.9 % (ref 11.5–14.5)
WBC: 9.4 10*3/uL (ref 3.6–11.0)

## 2017-11-02 LAB — BASIC METABOLIC PANEL
Anion gap: 8 (ref 5–15)
BUN: 23 mg/dL — ABNORMAL HIGH (ref 6–20)
CALCIUM: 9.4 mg/dL (ref 8.9–10.3)
CO2: 28 mmol/L (ref 22–32)
CREATININE: 0.71 mg/dL (ref 0.44–1.00)
Chloride: 96 mmol/L — ABNORMAL LOW (ref 101–111)
GLUCOSE: 120 mg/dL — AB (ref 65–99)
Potassium: 3.8 mmol/L (ref 3.5–5.1)
Sodium: 132 mmol/L — ABNORMAL LOW (ref 135–145)

## 2017-11-02 LAB — URIC ACID: URIC ACID, SERUM: 3.9 mg/dL (ref 2.3–6.6)

## 2017-11-02 LAB — LACTATE DEHYDROGENASE: LDH: 136 U/L (ref 98–192)

## 2017-11-02 NOTE — Progress Notes (Signed)
Pt in today for follow up and test results.  Pt reports exercising this morning and having a sharp pain in right clavicle/chest area.  Ate breakfast and lunch and had vomiting episode after each meal.

## 2017-11-02 NOTE — Progress Notes (Signed)
[facility] day:  11/02/17  Chief Complaint: [name] is a 83 y.o. female with a history of stage IIIA follicular non-Hodgkin's lymphoma who is seen after lymph node excision.  HPI: The patient was last seen in the medical oncology clinic on 10/02/2017.  At that time, she had a right preauricular lesion that was enlarging.  She underwent excision of this mass on 10/04/2017 by Dr. Pryor Ochoa.  Pathology revealed a low grade follicular lymphoma.  She underwent chest, abdomen, and pelvic CT on 10/29/2017.  Imaging revealed an enlarged mass in the porta hepatis at site of prior presumed lymph node. Differential would include isolated recurrent lymphadenopathy from non-Hodgkin's lymphoma versus gastrointestinal stromal tumor. Lesion was 5.8 x 4.9 cm (compared to 2.5 x 3.3 cm on 09/26/2015).  This lesion has been present but smaller at this site since 2015.  It was not present on PET-CT scan of 2009.  There was no additional metastatic disease evident in the chest, abdomen or pelvis.  Symptomatically, she notes pain in her RIGHT shoulder. She states, "It is deep under my clavicle". The pain has occurred 3 times over the last year. Pain is accompanied by vomiting every 10 minutes for "about an hour". She notes that her emesis has been "mucousy". During theses episodes, patient also becomes short of breath. Pain is exacerbated by deep inspiration. Patient denies trauma to the area. She denies fevers and changes in her bowel habits.   Patient has experienced a decreased appetite. She has lost 2 pounds.    Past Medical History:  Diagnosis Date  . Cancer [facility]) 2013   Non-Hodgkin Lymphoma with chemo and rad tx  . Follicular lymphoma grade I ([place]) 08/06/2002  . 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 Hypertension   . Meniere's disease    with bilateral hearing loss  . Mini stroke [facility])    summer 2014  . Osteoporosis     Past  Surgical History:  Procedure Laterality Date  . BONE MARROW BIOPSY  08/09/2002  . BREAST LUMPECTOMY  1950s   benign  . CATARACT EXTRACTION  03/2009  . LYMPH NODE BIOPSY  08/07/2002   cervical  . TUBAL LIGATION      Family History  Problem Relation Age of Onset  . Cancer Father        Throat  . Cancer Sister 33       Colon    Social History:  reports that  has never smoked. she has never used smokeless tobacco. She reports that she does not drink alcohol or use drugs.  The patient is accompanied by her husband today.  Allergies:  Allergies  Allergen Reactions  . Lisinopril Cough    Current Medications: Current Outpatient Medications  Medication Sig Dispense Refill  . Acetaminophen (TYLENOL ARTHRITIS EXT RELIEF PO) Take 1 tablet by mouth daily as needed (pain).    [name] aspirin 81 MG chewable tablet Chew 81 mg by mouth daily.     . AZELASTINE & FLUTICASONE NA 1 spray each nase twice daily.    . calcium-vitamin D (CALCIUM 500/D) 500-200 MG-UNIT tablet Take 1 tablet by mouth 2 (two) times daily.     . famotidine (PEPCID) 20 MG tablet Take 20 mg by mouth daily.     . Multiple Vitamin (MULTI-VITAMINS) TABS Take 1 tablet by mouth daily.     . Multiple Vitamins-Minerals (PRESERVISION AREDS 2 PO) Take 1 tablet by mouth daily.     [name]  NON FORMULARY Take 1 tablet by mouth daily. Tumeric-root extract tablet    . Probiotic Product (PROBIOTIC DAILY PO) Take 2 tablets by mouth daily.     . traZODone (DESYREL) 50 MG tablet TAKE 1 TO 2 TABLETS BY MOUTH DAILY AT BEDTIME AS NEEDED    . triamcinolone cream (KENALOG) 0.1 % Apply 1 application topically 2 (two) times daily.     . Turmeric Curcumin 500 MG CAPS Take 1 capsule by mouth daily.      No current facility-administered medications for this visit.     Review of Systems:  GENERAL:  Limited energy, but improving.  No fevers or sweats.  Weight down 2 pounds. PERFORMANCE STATUS (ECOG):  2 HEENT:  No visual changes, runny nose, sore throat,  mouth sores or tenderness. Lungs: No shortness of breath or cough.  No hemoptysis. Cardiac:  No chest pain, palpitations, orthopnea, or PND.  Heart flutters. GI:  Decrease appetite. (+) nausea and vomiting afer breakfast and lunch. No diarrhea, constipation, melena or hematochezia. GU:  No urgency, frequency, dysuria, or hematuria. Musculoskeletal:  Right shoulder pain.  No back pain.  No muscle tenderness. Extremities:  Swelling in ankles. Skin:  No rashes or skin changes. Neuro:  Balance issues, slightly better.  No headache, numbness or weakness or coordination issues. Endocrine:  No diabetes, thyroid issues, hot flashes or night sweats.  Dr. Astrid Divine monitoring thyroid function. Psych:  No mood changes, depression or anxiety. Pain:  No focal pain. Review of systems:  All other systems reviewed and found to be negative.  Physical Exam: Blood pressure (!) 147/80, pulse 73, temperature (!) 97.4 F (36.3 C), temperature source Tympanic, weight 105 lb 7 oz (47.8 kg). GENERAL:  Thin elderly woman sitting comfortably in the exam room in no acute distress. MENTAL STATUS:  Alert and oriented to person, place and time. HEAD:  Curly gray hair.  Normocephalic, atraumatic, face symmetric, no Cushingoid features. EYES:  Glasses.  Blue eyes.  Pupils equal round and reactive to light and accomodation.  No conjunctivitis or scleral icterus. ENT:  Oropharynx clear without lesion.  Tongue normal. Mucous membranes moist.  RESPIRATORY:  Clear to auscultation without rales, wheezes or rhonchi. CARDIOVASCULAR:  Regular rate and rhythm without murmur, rub or gallop. ABDOMEN:  Soft, non-tender, with active bowel sounds, and no hepatosplenomegaly.  No masses. SKIN:  No rashes, ulcers or lesions. EXTREMITIES: No edema, no skin discoloration or tenderness.  No palpable cords. LYMPH NODES: No palpable cervical, supraclavicular, axillary or inguinal adenopathy  NEUROLOGICAL: Unremarkable. PSYCH:   Appropriate.  Imaging studies: 09/02/2013:  Chest, abdomen, and pelvic CT revealed no significant interval change. She has some mildly enlarged intra-aortic caval retroperitoneal nodes measuring 1.6 x 1.2 cm. There were stable subcentimeter retroperitoneal nodes. 09/07/2014:  Chest, abdomen, and pelvic CT revealed no evidence of lymphoma recurrence.   09/26/2015:  Chest, abdomen, and pelvic CT revealed stable portacaval (2.5 cm) and aortocaval (1.0 cm) lymph nodes and mild hepatomegaly.   10/29/2017:  Chest, abdomen, and pelvic CT revealed an enlarged mass in the porta hepatis at site of prior presumed lymph node. Differential would include isolated recurrent lymphadenopathy from non-Hodgkin's lymphoma versus gastrointestinal stromal tumor. Lesion was 5.8 x 4.9 cm (compared to 2.5 x 3.3 cm on 09/26/2015).  This lesion has been present but smaller at this site since 2015.  It was not present on PET-CT scan of 2009.  There was no additional metastatic disease evident in the chest, abdomen or pelvis.   No visits  with results within 3 Day(s) from this visit.  Latest known visit with results is:  Appointment on 10/02/2017  Component Date Value Ref Range Status  . WBC 10/02/2017 5.7  3.6 - 11.0 K/uL Final  . RBC 10/02/2017 4.12  3.80 - 5.20 MIL/uL Final  . Hemoglobin 10/02/2017 13.5  12.0 - 16.0 g/dL Final  . HCT 10/02/2017 39.8  35.0 - 47.0 % Final  . MCV 10/02/2017 96.6  80.0 - 100.0 fL Final  . MCH 10/02/2017 32.6  26.0 - 34.0 pg Final  . MCHC 10/02/2017 33.8  32.0 - 36.0 g/dL Final  . RDW 10/02/2017 13.3  11.5 - 14.5 % Final  . Platelets 10/02/2017 250  150 - 440 K/uL Final  . Neutrophils Relative % 10/02/2017 76  % Final  . Neutro Abs 10/02/2017 4.3  1.4 - 6.5 K/uL Final  . Lymphocytes Relative 10/02/2017 11  % Final  . Lymphs Abs 10/02/2017 0.6* 1.0 - 3.6 K/uL Final  . Monocytes Relative 10/02/2017 11  % Final  . Monocytes Absolute 10/02/2017 0.6  0.2 - 0.9 K/uL Final  . Eosinophils  Relative 10/02/2017 1  % Final  . Eosinophils Absolute 10/02/2017 0.1  0 - 0.7 K/uL Final  . Basophils Relative 10/02/2017 1  % Final  . Basophils Absolute 10/02/2017 0.1  0 - 0.1 K/uL Final  . Sodium 10/02/2017 134* 135 - 145 mmol/L Final  . Potassium 10/02/2017 4.2  3.5 - 5.1 mmol/L Final  . Chloride 10/02/2017 98* 101 - 111 mmol/L Final  . CO2 10/02/2017 27  22 - 32 mmol/L Final  . Glucose, Bld 10/02/2017 91  65 - 99 mg/dL Final  . BUN 10/02/2017 18  6 - 20 mg/dL Final  . Creatinine, Ser 10/02/2017 0.59  0.44 - 1.00 mg/dL Final  . Calcium 10/02/2017 9.3  8.9 - 10.3 mg/dL Final  . Total Protein 10/02/2017 6.2* 6.5 - 8.1 g/dL Final  . Albumin 10/02/2017 4.0  3.5 - 5.0 g/dL Final  . AST 10/02/2017 22  15 - 41 U/L Final  . ALT 10/02/2017 24  14 - 54 U/L Final  . Alkaline Phosphatase 10/02/2017 77  38 - 126 U/L Final  . Total Bilirubin 10/02/2017 0.9  0.3 - 1.2 mg/dL Final  . GFR calc non Af Amer 10/02/2017 >60  >60 mL/min Final  . GFR calc Af Amer 10/02/2017 >60  >60 mL/min Final   Comment: (NOTE) The eGFR has been calculated using the CKD EPI equation. This calculation has not been validated in all clinical situations. eGFR's persistently <60 mL/min signify possible Chronic Kidney Disease.   . Anion gap 10/02/2017 9  5 - 15 Final  . LDH 10/02/2017 135  98 - 192 U/L Final    Assessment:  [name] is a 83 y.o. female with a history of stage IIIA follicular non-Hodgkin's lymphoma.  She presented in the summer of 2003 with an epigastric and left upper quadrant mass and left arm weakness with decreased mobility. Abdominal ultrasound revealed a large mass in the head of the pancreas with  multiple enlarged lymph nodes and a large lobulated mass in the right retroperitoneal area.  MRI of the left brachial plexus revealed a 2.5 cm soft tissue mass and abnormal lymph node along the left brachial plexus and in the supraclavicular region.  Cervical node biopsy on 08/06/2002 revealed a  grade I follicular non-Hodgkin's lymphoma.  Bone marrow on 08/09/2002 was negative.  Lumbar puncture was negative.  She received Leukeran and  Rituxan. As her arm pain and mobility was not relieved, she received local radiation to the left brachial plexus area. Leukeran continued through 02/28/2013 (discontinued secondary side effects).  She developed progressive disease in 10/2003. Patient received CVP chemotherapy beginning 01/02/2004. In 06/2004 she was switched to CNOP.  She received 8 cycles completing on 01/15/2005.  She began maintenance Rituxan on 04/23/2004 (4 weekly doses every 3 months for 2 years). Therapy completed on 09/13/2008.    Excision of a 1 cm right preauricular mass on 10/04/2017 revealed a low grade follicular lymphoma.  Chest, abdomen, and pelvic CT on 10/29/2017 revealed an enlarged mass in the porta hepatis at site of prior presumed lymph node. Differential would include isolated recurrent lymphadenopathy from non-Hodgkin's lymphoma versus gastrointestinal stromal tumor. Lesion was 5.8 x 4.9 cm (compared to 2.5 x 3.3 cm on 09/26/2015).    She was admitted to [facility] from 08/15/2015 - 08/25/2015 with bloody diarrhea then hemolytic uremic syndrome related to E. coli 0157.  She had a complicated course with acute renal failure and anemia.  She was then in rehab for 3 weeks.  She continues to work on her balance.  Symptomatically, she has pain in her RIGHT shoulder. She has accompanying nausea and vomiting for "about an hour" when she has pain. Exam reveals no adenopathy or hepatosplenomegaly. Patient has a decreased appetite and is losing weight. Labs are unremarkable.   Plan: 1.  Labs today:  CBC with diff, BMP, LDH, uric acid, G6PD assay, hepatitis B surface antigen, hepatitis B core antibody, hepatitis C antibody. 2.  Review interval lymph node biopsy- low grade follicular lymphoma. 3.  Review CT scans- enlarging porta hepatis mass.  Differential includes lymphoma versus GIST. 4.   Discuss need for biopsy of hepatic mass. Discuss with radiologist to determine best biopsy approach.  If CT guided biopsy is not possible, may need to consider surgical approach.  5.  Discuss review at tumor board. Will place patient on roster for 11/12/2017 tumor board.  6.  RTC in 2 weeks for MD assessment, review of workup, and decision regarding direction of therapy.   Honor Loh, NP  11/02/2017, 2:31 PM   I saw and evaluated the patient, participating in the key portions of the service and reviewing pertinent diagnostic studies and records.  I reviewed the nurse practitioner's note and agree with the findings and the plan.  The assessment and plan were discussed with the patient.  Multiple questions were asked by the patient and answered.   Nolon Stalls, MD 11/02/2017,2:31 PM

## 2017-11-03 LAB — HEPATITIS C ANTIBODY: HCV Ab: <0.1 s/co ratio (ref 0.0–0.9)

## 2017-11-03 LAB — GLUCOSE 6 PHOSPHATE DEHYDROGENASE
G6PDH: 9.6 U/g{Hb} (ref 4.6–13.5)
HEMOGLOBIN: 14.1 g/dL (ref 11.1–15.9)

## 2017-11-03 LAB — HEPATITIS B SURFACE ANTIGEN: HEP B S AG: NEGATIVE

## 2017-11-03 LAB — HEPATITIS B CORE ANTIBODY, TOTAL: HEP B C TOTAL AB: NEGATIVE

## 2017-11-04 ENCOUNTER — Encounter: Payer: Self-pay | Admitting: Urgent Care

## 2017-11-05 ENCOUNTER — Encounter: Payer: Self-pay | Admitting: Hematology and Oncology

## 2017-11-12 ENCOUNTER — Other Ambulatory Visit: Payer: Self-pay | Admitting: Hematology and Oncology

## 2017-11-12 DIAGNOSIS — C8200 Follicular lymphoma grade I, unspecified site: Secondary | ICD-10-CM

## 2017-11-13 ENCOUNTER — Telehealth: Payer: Self-pay | Admitting: *Deleted

## 2017-11-13 NOTE — Telephone Encounter (Signed)
Called patient to make her aware of her upcoming appts To see Dr. Mike Gip on 11/17/17 and her scheduled CT Biopsy date and time. Patient is aware.

## 2017-11-17 ENCOUNTER — Encounter: Payer: Self-pay | Admitting: Hematology and Oncology

## 2017-11-17 ENCOUNTER — Inpatient Hospital Stay: Payer: Medicare Other | Attending: Hematology and Oncology | Admitting: Hematology and Oncology

## 2017-11-17 VITALS — BP 147/80 | HR 67 | Temp 97.4°F | Resp 20 | Wt 108.6 lb

## 2017-11-17 DIAGNOSIS — M25511 Pain in right shoulder: Secondary | ICD-10-CM | POA: Diagnosis not present

## 2017-11-17 DIAGNOSIS — Z800 Family history of malignant neoplasm of digestive organs: Secondary | ICD-10-CM | POA: Diagnosis not present

## 2017-11-17 DIAGNOSIS — Z79899 Other long term (current) drug therapy: Secondary | ICD-10-CM | POA: Insufficient documentation

## 2017-11-17 DIAGNOSIS — C8238 Follicular lymphoma grade IIIa, lymph nodes of multiple sites: Secondary | ICD-10-CM | POA: Diagnosis present

## 2017-11-17 DIAGNOSIS — Z923 Personal history of irradiation: Secondary | ICD-10-CM | POA: Diagnosis not present

## 2017-11-17 DIAGNOSIS — I10 Essential (primary) hypertension: Secondary | ICD-10-CM | POA: Diagnosis not present

## 2017-11-17 DIAGNOSIS — Z8673 Personal history of transient ischemic attack (TIA), and cerebral infarction without residual deficits: Secondary | ICD-10-CM | POA: Insufficient documentation

## 2017-11-17 DIAGNOSIS — H8109 Meniere's disease, unspecified ear: Secondary | ICD-10-CM | POA: Diagnosis not present

## 2017-11-17 DIAGNOSIS — R109 Unspecified abdominal pain: Secondary | ICD-10-CM | POA: Diagnosis not present

## 2017-11-17 DIAGNOSIS — C8200 Follicular lymphoma grade I, unspecified site: Secondary | ICD-10-CM

## 2017-11-17 DIAGNOSIS — Z7982 Long term (current) use of aspirin: Secondary | ICD-10-CM | POA: Insufficient documentation

## 2017-11-17 DIAGNOSIS — R634 Abnormal weight loss: Secondary | ICD-10-CM | POA: Diagnosis not present

## 2017-11-17 DIAGNOSIS — Z9221 Personal history of antineoplastic chemotherapy: Secondary | ICD-10-CM

## 2017-11-17 DIAGNOSIS — M810 Age-related osteoporosis without current pathological fracture: Secondary | ICD-10-CM | POA: Diagnosis not present

## 2017-11-17 NOTE — Progress Notes (Signed)
Patient offers no complaints today. 

## 2017-11-17 NOTE — Progress Notes (Signed)
[facility] day:  11/17/17  Chief Complaint: [name] is a 83 y.o. female with a history of stage IIIA follicular non-Hodgkin's lymphoma who is seen for 2 week assessment.  HPI: The patient was last seen in the medical oncology clinic on 11/02/2017.  At that time, she noted a decrease in appetite.  She had lost 2 pounds.  Preauricular biopsy confirmed low grade follicular lymphoma.  Imaging revealed an enlarging mass in the porta hepatis.  We discussed biopsy.  She was presented at tumor board on 11/12/2017.  Recommendations were for CT guided biopsy by interventional radiology.  Additional labs on 11/02/2017 included a normal CBC with diff and CMP.  Hepatitis B surface antigen, hepatitis B core antibody, and hepatitis C antibody were negative.  G6PD assay was normal.  CT guided biopsy is scheduled for 11/20/2017.  Symptomatically, she denies any complaints.  She denies any fevers, sweats or weight loss.   Past Medical History:  Diagnosis Date  . Cancer [facility]) 2013   Non-Hodgkin Lymphoma with chemo and rad tx  . Follicular lymphoma grade I ([place]) 08/06/2002  . 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 Hypertension   . Meniere's disease    with bilateral hearing loss  . Mini stroke [facility])    summer 2014  . Osteoporosis     Past Surgical History:  Procedure Laterality Date  . BONE MARROW BIOPSY  08/09/2002  . BREAST LUMPECTOMY  1950s   benign  . CATARACT EXTRACTION  03/2009  . LYMPH NODE BIOPSY  08/07/2002   cervical  . TUBAL LIGATION      Family History  Problem Relation Age of Onset  . Cancer Father        Throat  . Cancer Sister 36       Colon    Social History:  reports that  has never smoked. she has never used smokeless tobacco. She reports that she does not drink alcohol or use drugs.  The patient is accompanied by her husband today.  Allergies:  Allergies  Allergen Reactions  .  Lisinopril Cough    Current Medications: Current Outpatient Medications  Medication Sig Dispense Refill  . Acetaminophen (TYLENOL ARTHRITIS EXT RELIEF PO) Take 1 tablet by mouth daily as needed (pain).    [name] aspirin 81 MG chewable tablet Chew 81 mg by mouth daily.     . AZELASTINE & FLUTICASONE NA 1 spray each nase twice daily.    . calcium-vitamin D (CALCIUM 500/D) 500-200 MG-UNIT tablet Take 1 tablet by mouth 2 (two) times daily.     . famotidine (PEPCID) 20 MG tablet Take 20 mg by mouth daily.     . Multiple Vitamin (MULTI-VITAMINS) TABS Take 1 tablet by mouth daily.     . Multiple Vitamins-Minerals (PRESERVISION AREDS 2 PO) Take 1 tablet by mouth daily.     . NON FORMULARY Take 1 tablet by mouth daily. Tumeric-root extract tablet    . Probiotic Product (PROBIOTIC DAILY PO) Take 2 tablets by mouth daily.     . traZODone (DESYREL) 50 MG tablet TAKE 1 TO 2 TABLETS BY MOUTH DAILY AT BEDTIME AS NEEDED    . triamcinolone cream (KENALOG) 0.1 % Apply 1 application topically 2 (two) times daily.     . Turmeric Curcumin 500 MG CAPS Take 1 capsule by mouth daily.     [name] allopurinol (ZYLOPRIM) 300 MG tablet Take 1 tablet (300 mg total)  by mouth daily. 30 tablet 1   No current facility-administered medications for this visit.     Review of Systems:  GENERAL:  Limited energy, but improving.  No fevers or sweats.  Weight up 3 pounds. PERFORMANCE STATUS (ECOG):  2 HEENT:  No visual changes, runny nose, sore throat, mouth sores or tenderness. Lungs: No shortness of breath or cough.  No hemoptysis. Cardiac:  No chest pain, palpitations, orthopnea, or PND.  Heart flutters. GI:  No nausea, diarrhea, constipation, melena or hematochezia. GU:  No urgency, frequency, dysuria, or hematuria. Musculoskeletal:  Right shoulder pain.  No back pain.  No muscle tenderness. Extremities:  Swelling in ankles. Skin:  No rashes or skin changes. Neuro:  Balance issues.  No headache, numbness or weakness or  coordination issues. Endocrine:  No diabetes, thyroid issues, hot flashes or night sweats.  Dr. Astrid Divine monitoring thyroid function. Psych:  No mood changes, depression or anxiety. Pain:  No focal pain. Review of systems:  All other systems reviewed and found to be negative.  Physical Exam: Blood pressure (!) 147/80, pulse 67, temperature (!) 97.4 F (36.3 C), resp. rate 20, weight 108 lb 9 oz (49.2 kg). GENERAL:  Thin elderly woman sitting comfortably in the exam room in no acute distress. MENTAL STATUS:  Alert and oriented to person, place and time. HEAD:  Curly gray hair.  Normocephalic, atraumatic, face symmetric, no Cushingoid features. EYES:  Glasses.  Blue eyes.  No conjunctivitis or scleral icterus. NEUROLOGICAL: Unremarkable. PSYCH:  Appropriate.   Imaging studies: 09/02/2013:  Chest, abdomen, and pelvic CT revealed no significant interval change. She has some mildly enlarged intra-aortic caval retroperitoneal nodes measuring 1.6 x 1.2 cm. There were stable subcentimeter retroperitoneal nodes. 09/07/2014:  Chest, abdomen, and pelvic CT revealed no evidence of lymphoma recurrence.   09/26/2015:  Chest, abdomen, and pelvic CT revealed stable portacaval (2.5 cm) and aortocaval (1.0 cm) lymph nodes and mild hepatomegaly.   10/29/2017:  Chest, abdomen, and pelvic CT revealed an enlarged mass in the porta hepatis at site of prior presumed lymph node. Differential would include isolated recurrent lymphadenopathy from non-Hodgkin's lymphoma versus gastrointestinal stromal tumor. Lesion was 5.8 x 4.9 cm (compared to 2.5 x 3.3 cm on 09/26/2015).  This lesion has been present but smaller at this site since 2015.  It was not present on PET-CT scan of 2009.  There was no additional metastatic disease evident in the chest, abdomen or pelvis.   No visits with results within 3 Day(s) from this visit.  Latest known visit with results is:  Appointment on 11/02/2017  Component Date Value Ref Range  Status  . HCV Ab 11/02/2017 <0.1  0.0 - 0.9 s/co ratio Final   Comment: (NOTE)                                  Negative:     < 0.8                             Indeterminate: 0.8 - 0.9                                  Positive:     > 0.9 The CDC recommends that a positive HCV antibody result be followed up with a HCV Nucleic Acid Amplification test ([id number]). Performed  At: [facility] [place], [place] [id number] Rush Farmer MD [id number]   . Hepatitis B Surface Ag 11/02/2017 Negative  Negative Final   Comment: (NOTE) Performed At: [facility] [place], [place] [id number] Rush Farmer MD [id number]   . Hep B Core Total Ab 11/02/2017 Negative  Negative Final   Comment: (NOTE) Performed At: [facility] [place], [place] [id number] Rush Farmer MD [id number]   . Hemoglobin 11/02/2017 14.1  11.1 - 15.9 g/dL Final  . G6PDH 11/02/2017 9.6  4.6 - 13.5 U/g Hb Final   Comment: (NOTE) When decreased, G-6-PD, Quant. values are associated with acute hemolytic anemia when deficient individuals are exposed to oxidative stress, such as with certain medications (e.g., primaquine), infection, or ingestion of fava beans. Caution: In patients with acute hemolysis (e.g., abnormally low RBC values), testing for G-6-PD may be falsely normal because older erythrocytes with a higher enzyme deficiency have been hemolyzed. Young erythrocytes and reticulocytes have normal or near-normal enzyme activity. Normal values of G-6-PD may be measured for several weeks following a hemolytic event. Performed At: [facility] [place], [place] [id number] Rush Farmer MD [id number]   . Uric Acid, Serum 11/02/2017 3.9  2.3 - 6.6 mg/dL Final  . LDH 11/02/2017 136  98 - 192 U/L Final  . Sodium 11/02/2017 132* 135 - 145 mmol/L Final  . Potassium 11/02/2017 3.8  3.5 - 5.1 mmol/L Final  . Chloride  11/02/2017 96* 101 - 111 mmol/L Final  . CO2 11/02/2017 28  22 - 32 mmol/L Final  . Glucose, Bld 11/02/2017 120* 65 - 99 mg/dL Final  . BUN 11/02/2017 23* 6 - 20 mg/dL Final  . Creatinine, Ser 11/02/2017 0.71  0.44 - 1.00 mg/dL Final  . Calcium 11/02/2017 9.4  8.9 - 10.3 mg/dL Final  . GFR calc non Af Amer 11/02/2017 >60  >60 mL/min Final  . GFR calc Af Amer 11/02/2017 >60  >60 mL/min Final   Comment: (NOTE) The eGFR has been calculated using the CKD EPI equation. This calculation has not been validated in all clinical situations. eGFR's persistently <60 mL/min signify possible Chronic Kidney Disease.   . Anion gap 11/02/2017 8  5 - 15 Final  . WBC 11/02/2017 9.4  3.6 - 11.0 K/uL Final  . RBC 11/02/2017 4.20  3.80 - 5.20 MIL/uL Final  . Hemoglobin 11/02/2017 13.7  12.0 - 16.0 g/dL Final  . HCT 11/02/2017 40.4  35.0 - 47.0 % Final  . MCV 11/02/2017 96.3  80.0 - 100.0 fL Final  . MCH 11/02/2017 32.6  26.0 - 34.0 pg Final  . MCHC 11/02/2017 33.8  32.0 - 36.0 g/dL Final  . RDW 11/02/2017 12.9  11.5 - 14.5 % Final  . Platelets 11/02/2017 240  150 - 440 K/uL Final  . Neutrophils Relative % 11/02/2017 87  % Final  . Neutro Abs 11/02/2017 8.2* 1.4 - 6.5 K/uL Final  . Lymphocytes Relative 11/02/2017 3  % Final  . Lymphs Abs 11/02/2017 0.3* 1.0 - 3.6 K/uL Final  . Monocytes Relative 11/02/2017 9  % Final  . Monocytes Absolute 11/02/2017 0.8  0.2 - 0.9 K/uL Final  . Eosinophils Relative 11/02/2017 0  % Final  . Eosinophils Absolute 11/02/2017 0.0  0 - 0.7 K/uL Final  . Basophils Relative 11/02/2017 1  % Final  . Basophils Absolute 11/02/2017 0.1  0 - 0.1 K/uL Final    Assessment:  [name] is a  83 y.o. female with a history of stage IIIA follicular non-Hodgkin's lymphoma.  She presented in the summer of 2003 with an epigastric and left upper quadrant mass and left arm weakness with decreased mobility. Abdominal ultrasound revealed a large mass in the head of the pancreas with   multiple enlarged lymph nodes and a large lobulated mass in the right retroperitoneal area.  MRI of the left brachial plexus revealed a 2.5 cm soft tissue mass and abnormal lymph node along the left brachial plexus and in the supraclavicular region.  Cervical node biopsy on 08/06/2002 revealed a grade I follicular non-Hodgkin's lymphoma.  Bone marrow on 08/09/2002 was negative.  Lumbar puncture was negative.  She received Leukeran and Rituxan. As her arm pain and mobility was not relieved, she received local radiation to the left brachial plexus area. Leukeran continued through 02/28/2013 (discontinued secondary side effects).  She developed progressive disease in 10/2003. Patient received CVP chemotherapy beginning 01/02/2004. In 06/2004 she was switched to CNOP.  She received 8 cycles completing on 01/15/2005.  She began maintenance Rituxan on 04/23/2004 (4 weekly doses every 3 months for 2 years). Therapy completed on 09/13/2008.    Excision of a 1 cm right preauricular mass on 10/04/2017 revealed a low grade follicular lymphoma.  Chest, abdomen, and pelvic CT on 10/29/2017 revealed an enlarged mass in the porta hepatis at site of prior presumed lymph node. Differential would include isolated recurrent lymphadenopathy from non-Hodgkin's lymphoma versus gastrointestinal stromal tumor. Lesion was 5.8 x 4.9 cm (compared to 2.5 x 3.3 cm on 09/26/2015).    She was admitted to [facility] from 08/15/2015 - 08/25/2015 with bloody diarrhea then hemolytic uremic syndrome related to E. coli 0157.  She had a complicated course with acute renal failure and anemia.  She was then in rehab for 3 weeks.  She continues to work on her balance.  Symptomatically, she denies any B symptoms.  Exam reveals no adenopathy or hepatosplenomegaly. Labs are unremarkable.   Plan: 1.  Review labs from 11/02/2017. 2.  Discuss tumor board discussions from 11/12/2017.  3.  CT guided porta hepatis mass on 11/20/2017. 4.  RTC on 11/27/2017  for MD assessment, review of pathology, and assessment regarding direction of therapy.   Overton Mam, NP 11/17/2017, 2:45 PM   I saw and evaluated the patient, participating in the key portions of the service and reviewing pertinent diagnostic studies and records.  I reviewed the nurse practitioner's note and agree with the findings and the plan.  The assessment and plan were discussed with the patient.  Several questions were asked by the patient and answered.   Nolon Stalls, MD 11/17/2017, 2:45 PM

## 2017-11-18 ENCOUNTER — Other Ambulatory Visit: Payer: Self-pay | Admitting: Student

## 2017-11-18 ENCOUNTER — Other Ambulatory Visit: Payer: Self-pay | Admitting: Radiology

## 2017-11-20 ENCOUNTER — Ambulatory Visit: Payer: Medicare Other

## 2017-11-20 ENCOUNTER — Ambulatory Visit
Admission: RE | Admit: 2017-11-20 | Discharge: 2017-11-20 | Disposition: A | Discharge status: Home / Self Care | Payer: Medicare Other | Source: Ambulatory Visit | Attending: Hematology and Oncology | Admitting: Hematology and Oncology

## 2017-11-20 DIAGNOSIS — C8203 Follicular lymphoma grade I, intra-abdominal lymph nodes: Secondary | ICD-10-CM | POA: Insufficient documentation

## 2017-11-20 DIAGNOSIS — C8200 Follicular lymphoma grade I, unspecified site: Secondary | ICD-10-CM

## 2017-11-20 LAB — PROTIME-INR
INR: 0.91
PROTHROMBIN TIME: 12.2 s (ref 11.4–15.2)

## 2017-11-20 LAB — CBC
HEMATOCRIT: 42 % (ref 35.0–47.0)
Hemoglobin: 14.2 g/dL (ref 12.0–16.0)
MCH: 32.6 pg (ref 26.0–34.0)
MCHC: 33.9 g/dL (ref 32.0–36.0)
MCV: 96.2 fL (ref 80.0–100.0)
Platelets: 231 10*3/uL (ref 150–440)
RBC: 4.37 MIL/uL (ref 3.80–5.20)
RDW: 13.2 % (ref 11.5–14.5)
WBC: 6.7 10*3/uL (ref 3.6–11.0)

## 2017-11-20 LAB — APTT: APTT: 26 s (ref 24–36)

## 2017-11-20 MED ORDER — FENTANYL CITRATE (PF) 100 MCG/2ML IJ SOLN
INTRAMUSCULAR | Status: AC
  Filled 2017-11-20: qty 2

## 2017-11-20 MED ORDER — MIDAZOLAM HCL 2 MG/2ML IJ SOLN
INTRAMUSCULAR | Status: AC
  Filled 2017-11-20: qty 2

## 2017-11-20 MED ORDER — MIDAZOLAM HCL 2 MG/2ML IJ SOLN
INTRAMUSCULAR | Status: AC | PRN
  Administered 2017-11-20: 1 mg via INTRAVENOUS

## 2017-11-20 MED ORDER — FENTANYL CITRATE (PF) 100 MCG/2ML IJ SOLN
INTRAMUSCULAR | Status: AC | PRN
  Administered 2017-11-20: 50 ug via INTRAVENOUS

## 2017-11-20 MED ORDER — SODIUM CHLORIDE 0.9 % IV SOLN
INTRAVENOUS | Status: DC
  Administered 2017-11-20: 12:00:00 via INTRAVENOUS

## 2017-11-20 MED ORDER — LIDOCAINE HCL 1 % IJ SOLN
INTRAMUSCULAR | Status: AC | PRN
  Administered 2017-11-20: 8 mL via INTRADERMAL

## 2017-11-20 NOTE — Procedures (Signed)
Enlarging RT RP NODAL MASS  H/P FOLLICULAR LYMPHOMA  S/P CT RT RP NODAL MASS CORE BXS  NO COMP STABLE PATH PENDING FULL REPORT IN PACS

## 2017-11-20 NOTE — Consult Note (Signed)
Chief Complaint: Patient was seen in consultation today for CT guided biopsy of right retroperitoneal/porta hepatis lymph node  Referring Physician(s): [place]  Supervising Physician: Daryll Brod  Patient Status: [facility] - Out-pt  History of Present Illness: [name] is a 83 y.o. 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 Imaging revealed an enlarged nodal mass in the porta hepatis/right retroperitoneal region concerning for recurrent lymphoma vs GI stromal tumor.  She presents today for image guided biopsy of this nodal mass.  Past Medical History:  Diagnosis Date  . Cancer [facility]) 2013   Non-Hodgkin Lymphoma with chemo and rad tx  . Follicular lymphoma grade I ([place]) 08/06/2002  . 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 Hypertension   . Meniere's disease    with bilateral hearing loss  . Mini stroke [facility])    summer 2014  . Osteoporosis     Past Surgical History:  Procedure Laterality Date  . BONE MARROW BIOPSY  08/09/2002  . BREAST LUMPECTOMY  1950s   benign  . CATARACT EXTRACTION  03/2009  . LYMPH NODE BIOPSY  08/07/2002   cervical  . TUBAL LIGATION      Allergies: Lisinopril  Medications: Prior to Admission medications   Medication Sig Start Date End Date Taking? Authorizing Provider  Acetaminophen (TYLENOL ARTHRITIS EXT RELIEF PO) Take 1 tablet by mouth daily as needed (pain).    [provider]  aspirin 81 MG chewable tablet Chew 81 mg by mouth daily.     [provider]  AZELASTINE & FLUTICASONE NA 1 spray each nase twice daily.    [provider]  calcium-vitamin D (CALCIUM 500/D) 500-200 MG-UNIT tablet Take 1 tablet by mouth 2 (two) times daily.     [provider]  famotidine (PEPCID) 20 MG tablet Take 20 mg by mouth daily.     [provider]  Multiple Vitamin (MULTI-VITAMINS) TABS Take 1 tablet by mouth daily.     [provider]  Multiple Vitamins-Minerals (PRESERVISION AREDS 2 PO) Take 1 tablet by mouth daily.     [provider]  NON FORMULARY Take 1 tablet by mouth daily. Tumeric-root extract tablet    [provider]  Probiotic Product (PROBIOTIC DAILY PO) Take 2 tablets by mouth daily.     [provider]  traZODone (DESYREL) 50 MG tablet TAKE 1 TO 2 TABLETS BY MOUTH DAILY AT BEDTIME AS NEEDED 05/30/15 10/02/37  [provider]  triamcinolone cream (KENALOG) 0.1 % Apply 1 application topically 2 (two) times daily.     [provider]  Turmeric Curcumin 500 MG CAPS Take 1 capsule by mouth daily.     [provider]     Family History  Problem Relation Age of Onset  . Cancer Father        Throat  . Cancer Sister 36       Colon    Social History   Socioeconomic History  . Marital status: Married    Spouse name: Not on file  . Number of children: Not on file  . Years of education: Not on file  . Highest education level: Not on file  Social Needs  . Financial resource strain: Not on file  . Food insecurity - worry: Not on file  . Food insecurity - inability: Not on file  .  Transportation needs - medical: Not on file  . Transportation needs - non-medical: Not on file  Occupational History  . Not on file  Tobacco Use  . Smoking status: Never Smoker  . Smokeless tobacco: Never Used  Substance and Sexual Activity  . Alcohol use: No    Alcohol/week: 0.0 oz  . Drug use: No  . Sexual activity: Not on file  Other Topics Concern  . Not on file  Social History Narrative  . Not on file      Review of Systems currently denies fever, headache, chest pain, dyspnea, cough, abdominal/back pain, nausea, vomiting or bleeding.  Vital Signs: Vitals:   11/20/17 1140  BP: (!) 157/72  Pulse: 66  Resp: 13  Temp: 97.9 F (36.6 C)  SpO2: 100%       Physical Exam awake, alert.  Chest clear to auscultation bilaterally.  Heart with regular rate and rhythm.  Abdomen soft, positive bowel sounds, nontender.  No lower extremity edema.  Imaging: Ct Chest W Contrast  Result Date: 10/30/2017 CLINICAL DATA:  History of non-Hodgkin's lymphoma. Chemo radiation completed 2007. EXAM: CT CHEST, ABDOMEN, AND PELVIS WITH CONTRAST TECHNIQUE: Multidetector CT imaging of the chest, abdomen and pelvis was performed following the standard protocol during bolus administration of intravenous contrast. CONTRAST:  141m ISOVUE-300 IOPAMIDOL (ISOVUE-300) INJECTION 61% COMPARISON:  CT 09/26/2015 FINDINGS: Lower chest: Lung bases are clear. Hepatobiliary: No focal hepatic lesion. No biliary duct dilatation. Gallbladder is normal. Common bile duct is normal. Pancreas: Pancreas is normal. No ductal dilatation. No pancreatic inflammation. Spleen: Normal spleen Adrenals/urinary tract: Adrenal glands and kidneys are normal. The ureters and bladder normal. Stomach/Bowel: Stomach, small bowel, appendix, and cecum are normal. The colon and rectosigmoid colon are normal. Vascular/Lymphatic:  Abdominal aortic normal caliber. "Lymph node" in the porta hepatis is increased in size measuring 5.8 x 4.9 cm compared with 2.5 by 3.3 cm (09/26/2015). No additional enlarged lymph nodes in the abdomen pelvis. Reproductive: Uterus and ovaries normal Other: No free fluid. Musculoskeletal: No aggressive osseous lesion. IMPRESSION: 1. Enlarged mass in the porta hepatis at site of prior presumed lymph node. Differential would include isolated recurrent lymphadenopathy from non-Hodgkin's lymphoma versus gastrointestinal stromal tumor. This lesion has been present but smaller at this site since 2015. Not present on PET-CT scan of 2009. 2. No additional metastatic disease evident in the chest abdomen pelvis. Electronically Signed   By: SSuzy BouchardM.D.   On: 10/30/2017 10:22   Ct Abdomen Pelvis W  Contrast  Result Date: 10/30/2017 CLINICAL DATA:  History of non-Hodgkin's lymphoma. Chemo radiation completed 2007. EXAM: CT CHEST, ABDOMEN, AND PELVIS WITH CONTRAST TECHNIQUE: Multidetector CT imaging of the chest, abdomen and pelvis was performed following the standard protocol during bolus administration of intravenous contrast. CONTRAST:  106mISOVUE-300 IOPAMIDOL (ISOVUE-300) INJECTION 61% COMPARISON:  CT 09/26/2015 FINDINGS: Lower chest: Lung bases are clear. Hepatobiliary: No focal hepatic lesion. No biliary duct dilatation. Gallbladder is normal. Common bile duct is normal. Pancreas: Pancreas is normal. No ductal dilatation. No pancreatic inflammation. Spleen: Normal spleen Adrenals/urinary tract: Adrenal glands and kidneys are normal. The ureters and bladder normal. Stomach/Bowel: Stomach, small bowel, appendix, and cecum are normal. The colon and rectosigmoid colon are normal. Vascular/Lymphatic:  Abdominal aortic normal caliber. "Lymph node" in the porta hepatis is increased in size measuring 5.8 x 4.9 cm compared with 2.5 by 3.3 cm (09/26/2015). No additional enlarged lymph nodes in the abdomen pelvis. Reproductive: Uterus and ovaries normal Other: No free fluid. Musculoskeletal: No  aggressive osseous lesion. IMPRESSION: 1. Enlarged mass in the porta hepatis at site of prior presumed lymph node. Differential would include isolated recurrent lymphadenopathy from non-Hodgkin's lymphoma versus gastrointestinal stromal tumor. This lesion has been present but smaller at this site since 2015. Not present on PET-CT scan of 2009. 2. No additional metastatic disease evident in the chest abdomen pelvis. Electronically Signed   By: Suzy Bouchard M.D.   On: 10/30/2017 10:22   Dg Bone Density (dxa)  Result Date: 10/27/2017 EXAM: DUAL X-RAY ABSORPTIOMETRY (DXA) FOR BONE MINERAL DENSITY IMPRESSION: Dear Dr. Knox Saliva, Your patient Mikeila Burgen completed a BMD test on 10/27/2017 using the [employer] (analysis version: 14.10) manufactured by [employer]. The following summarizes the results of our evaluation. PATIENT BIOGRAPHICAL: Name: Arlee, Bossard Patient ID: [id number] Birth Date: 12-21-1931 Height: 63.5 in. Gender: Female Exam Date: 10/27/2017 Weight: 107.0 lbs. Indications: Caucasian, Family History of Fracture, Advanced Age, Family Hx of Osteoporosis, Postmenopausal, History of Fracture (Adult), Height Loss, Family Hist. (Parent hip fracture) Fractures: Left wrist Treatments: ASPRIN 81 MG, Vitamin D, CALCIUM VIT D, Multi-Vitamin with calcium, omeprazole ASSESSMENT: The BMD measured at Forearm Radius 33% is 0.545 g/cm2 with a T-score of -3.8. This patient is considered osteoporotic according to [facility]) criteria. Lumbar spine was not utilized due to scoliosis. Site Region Measured Measured WHO Young Adult BMD Date       Age      Classification T-score DualFemur Neck Left 10/27/2017 83.4 Osteopenia -2.4 0.699 g/cm2 DualFemur Neck Left 04/18/2015 80.9 Osteopenia -2.3 0.722 g/cm2 DualFemur Neck Left 10/29/2010 76.4 Osteopenia -1.8 0.793 g/cm2 Left Forearm Radius 33% 10/27/2017 83.4 Osteoporosis -3.8 0.545 g/cm2 Left Forearm Radius 33% 04/18/2015 80.9 Osteoporosis -3.1 0.603 g/cm2 World Health Organization [facility]) criteria for post-menopausal, Caucasian Women: Normal:       T-score at or above -1 SD Osteopenia:   T-score between -1 and -2.5 SD Osteoporosis: T-score at or below -2.5 SD RECOMMENDATIONS: [place] recommends that FDA-approved medical therapies be considered in postmenopausal women and men age 104 or older with a: 1. Hip or vertebral (clinical or morphometric) fracture. 2. T-score of < -2.5 at the spine or hip. 3. Ten-year fracture probability by FRAX of 3% or greater for hip fracture or 20% or greater for major osteoporotic fracture. All treatment decisions require clinical judgment and consideration of individual patient factors,  including patient preferences, co-morbidities, previous drug use, risk factors not captured in the FRAX model (e.g. falls, vitamin D deficiency, increased bone turnover, interval significant decline in bone density) and possible under - or over-estimation of fracture risk by FRAX. All patients should ensure an adequate intake of dietary calcium (1200 mg/d) and vitamin D (800 IU daily) unless contraindicated. FOLLOW-UP: People with diagnosed cases of osteoporosis or at high risk for fracture should have regular bone mineral density tests. For patients eligible for Medicare, routine testing is allowed once every 2 years. The testing frequency can be increased to one year for patients who have rapidly progressing disease, those who are receiving or discontinuing medical therapy to restore bone mass, or have additional risk factors. I have reviewed this report, and agree with the above findings. [facility] Electronically Signed   By: Abelardo Diesel M.D.   On: 10/27/2017 15:27    Labs:  CBC: Recent Labs    10/02/17 0914 11/02/17 1528  WBC 5.7 9.4  HGB 13.5 13.7  14.1  HCT 39.8 40.4  PLT 250 240  COAGS: No results for input(s): INR, APTT in the last 8760 hours.  BMP: Recent Labs    10/02/17 0914 11/02/17 1528  NA 134* 132*  K 4.2 3.8  CL 98* 96*  CO2 27 28  GLUCOSE 91 120*  BUN 18 23*  CALCIUM 9.3 9.4  CREATININE 0.59 0.71  GFRNONAA >60 >60  GFRAA >60 >60    LIVER FUNCTION TESTS: Recent Labs    10/02/17 0914  BILITOT 0.9  AST 22  ALT 24  ALKPHOS 77  PROT 6.2*  ALBUMIN 4.0    TUMOR MARKERS: No results for input(s): AFPTM, CEA, CA199, CHROMGRNA in the last 8760 hours.  Assessment and Plan: 83 y.o. 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 Imaging revealed an enlarged nodal mass in the porta hepatis/right retroperitoneal region  concerning for recurrent lymphoma vs GI stromal tumor.  She presents today for image guided biopsy of this nodal mass.Risks and benefits discussed with the patient including, but not limited to bleeding, infection, damage to adjacent structures or low yield requiring additional tests.All of the patient's questions were answered, patient is agreeable to proceed.Consent signed and in chart. LABS PEND.     Thank you for this interesting consult.  I greatly enjoyed meeting EARNEST THALMAN and look forward to participating in their care.  A copy of this report was sent to the requesting provider on this date.  Electronically Signed: D. Rowe Robert, PA-C 11/20/2017, 11:29 AM   I spent a total of 25 minutes in face to face in clinical consultation, greater than 50% of which was counseling/coordinating care for CT-guided biopsy of right retroperitoneal/porta hepatis nodal mass

## 2017-11-20 NOTE — Sedation Documentation (Signed)
Pt left on ct table additional 10 minutes per Dr Annamaria Boots, due to slight bleeding of node internally.

## 2017-11-20 NOTE — Discharge Instructions (Signed)
Needle Biopsy, Care After °Refer to this sheet in the next few weeks. These instructions provide you with information about caring for yourself after your procedure. Your health care provider may also give you more specific instructions. Your treatment has been planned according to current medical practices, but problems sometimes occur. Call your health care provider if you have any problems or questions after your procedure. °What can I expect after the procedure? °After your procedure, it is common to have soreness, bruising, or mild pain at the biopsy site. This should go away in a few days. °Follow these instructions at home: °· Rest as directed by your health care provider. °· Take medicines only as directed by your health care provider. °· There are many different ways to close and cover the biopsy site, including stitches (sutures), skin glue, and adhesive strips. Follow your health care provider's instructions about: °? Biopsy site care. °? Bandage (dressing) changes and removal. °? Biopsy site closure removal. °· Check your biopsy site every day for signs of infection. Watch for: °? Redness, swelling, or pain. °? Fluid, blood, or pus. °Contact a health care provider if: °· You have a fever. °· You have redness, swelling, or pain at the biopsy site that lasts longer than a few days. °· You have fluid, blood, or pus coming from the biopsy site. °· You feel nauseous. °· You vomit. °Get help right away if: °· You have shortness of breath. °· You have trouble breathing. °· You have chest pain. °· You feel dizzy or you faint. °· You have bleeding that does not stop with pressure or a bandage. °· You cough up blood. °· You have pain in your abdomen. °This information is not intended to replace advice given to you by your health care provider. Make sure you discuss any questions you have with your health care provider. °Document Released: 04/17/2015 Document Revised: 05/08/2016 Document Reviewed:  11/27/2014 °Elsevier Interactive Patient Education © 2018 [employer] °Moderate Conscious Sedation, Adult, Care After °These instructions provide you with information about caring for yourself after your procedure. Your health care provider may also give you more specific instructions. Your treatment has been planned according to current medical practices, but problems sometimes occur. Call your health care provider if you have any problems or questions after your procedure. °What can I expect after the procedure? °After your procedure, it is common: °· To feel sleepy for several hours. °· To feel clumsy and have poor balance for several hours. °· To have poor judgment for several hours. °· To vomit if you eat too soon. ° °Follow these instructions at home: °For at least 24 hours after the procedure: ° °· Do not: °? Participate in activities where you could fall or become injured. °? Drive. °? Use heavy machinery. °? Drink alcohol. °? Take sleeping pills or medicines that cause drowsiness. °? Make important decisions or sign legal documents. °? Take care of children on your own. °· Rest. °Eating and drinking °· Follow the diet recommended by your health care provider. °· If you vomit: °? Drink water, juice, or soup when you can drink without vomiting. °? Make sure you have little or no nausea before eating solid foods. °General instructions °· Have a responsible adult stay with you until you are awake and alert. °· Take over-the-counter and prescription medicines only as told by your health care provider. °· If you smoke, do not smoke without supervision. °· Keep all follow-up visits as told by your health care   provider. This is important. °Contact a health care provider if: °· You keep feeling nauseous or you keep vomiting. °· You feel light-headed. °· You develop a rash. °· You have a fever. °Get help right away if: °· You have trouble breathing. °This information is not intended to replace advice given to you  by your health care provider. Make sure you discuss any questions you have with your health care provider. °Document Released: 09/21/2013 Document Revised: 05/05/2016 Document Reviewed: 03/22/2016 °Elsevier Interactive Patient Education © 2018 [employer] ° °

## 2017-11-20 NOTE — Progress Notes (Signed)
Pt sitting up in bed eating and drinking w/o difficulty in NAD, family at bedside.  Discharge and followup instructions reviewed with pt and family who verbalize understanding.   

## 2017-11-25 LAB — SURGICAL PATHOLOGY

## 2017-11-27 ENCOUNTER — Inpatient Hospital Stay (HOSPITAL_BASED_OUTPATIENT_CLINIC_OR_DEPARTMENT_OTHER): Payer: Medicare Other | Admitting: Hematology and Oncology

## 2017-11-27 VITALS — BP 142/87 | HR 67 | Temp 97.3°F | Resp 18 | Wt 106.0 lb

## 2017-11-27 DIAGNOSIS — I10 Essential (primary) hypertension: Secondary | ICD-10-CM

## 2017-11-27 DIAGNOSIS — M25511 Pain in right shoulder: Secondary | ICD-10-CM | POA: Diagnosis not present

## 2017-11-27 DIAGNOSIS — Z8673 Personal history of transient ischemic attack (TIA), and cerebral infarction without residual deficits: Secondary | ICD-10-CM | POA: Diagnosis not present

## 2017-11-27 DIAGNOSIS — R634 Abnormal weight loss: Secondary | ICD-10-CM

## 2017-11-27 DIAGNOSIS — R109 Unspecified abdominal pain: Secondary | ICD-10-CM

## 2017-11-27 DIAGNOSIS — C8238 Follicular lymphoma grade IIIa, lymph nodes of multiple sites: Secondary | ICD-10-CM | POA: Diagnosis not present

## 2017-11-27 DIAGNOSIS — Z7982 Long term (current) use of aspirin: Secondary | ICD-10-CM

## 2017-11-27 DIAGNOSIS — Z923 Personal history of irradiation: Secondary | ICD-10-CM | POA: Diagnosis not present

## 2017-11-27 DIAGNOSIS — Z9221 Personal history of antineoplastic chemotherapy: Secondary | ICD-10-CM | POA: Diagnosis not present

## 2017-11-27 DIAGNOSIS — H8109 Meniere's disease, unspecified ear: Secondary | ICD-10-CM

## 2017-11-27 DIAGNOSIS — C8200 Follicular lymphoma grade I, unspecified site: Secondary | ICD-10-CM

## 2017-11-27 DIAGNOSIS — Z79899 Other long term (current) drug therapy: Secondary | ICD-10-CM | POA: Diagnosis not present

## 2017-11-27 DIAGNOSIS — Z800 Family history of malignant neoplasm of digestive organs: Secondary | ICD-10-CM

## 2017-11-27 DIAGNOSIS — M810 Age-related osteoporosis without current pathological fracture: Secondary | ICD-10-CM

## 2017-11-27 MED ORDER — ALLOPURINOL 300 MG PO TABS: 300.0000 mg | ORAL_TABLET | Freq: Every day | ORAL | 1 refills | Status: DC

## 2017-11-27 NOTE — Progress Notes (Signed)
[facility] day:  11/27/17  Chief Complaint: [name] is a 83 y.o. female with a history of stage IIIA follicular non-Hodgkin's lymphoma who is seen for review of interval biopsy and discussion regarding direction of therapy.  HPI: The patient was last seen in the medical oncology clinic on 11/17/2017.  At that time, she denied any new complaints.  We discussed biopsy of the enlarging mass in the porta hepatis.  CT guided biopsy of the retroperitoneal nodal mass on 11/20/2017 revealed grade I follicular lymphoma.  Immunohistochemistry revealed positive for CD10, BCL-2, BCL-6, Ki67 (15-20%) and negative for cyclin D1 and CD5.  Flow cytometry revealed a CD10+ clonal B-cell population.   Symptomatically, patient has been doing "ok". Patient notes that she had an extended recovery following her recent CT guided biopsy due to bleeding. Patient was in recovery for "over 3 hours" prior to being discharged home. Patient denies any residual complications at home. She has experienced no B symptoms or interval infections since her last visit.   Patient is eating well. Her weight has decreased 2 pounds since her last visit. Patient has continued intermittent abdominal pain, and pain in her RIGHT shoulder. Pain rated at 6/10 today.    Past Medical History:  Diagnosis Date  . Cancer [facility]) 2013   Non-Hodgkin Lymphoma with chemo and rad tx  . Follicular lymphoma grade I ([place]) 08/06/2002  . 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 Hypertension   . Meniere's disease    with bilateral hearing loss  . Mini stroke [facility])    summer 2014  . Osteoporosis     Past Surgical History:  Procedure Laterality Date  . BONE MARROW BIOPSY  08/09/2002  . BREAST LUMPECTOMY  1950s   benign  . CATARACT EXTRACTION  03/2009  . LYMPH NODE BIOPSY  08/07/2002   cervical  . TUBAL LIGATION      Family History  Problem Relation Age of Onset  .  Cancer Father        Throat  . Cancer Sister 41       Colon    Social History:  reports that  has never smoked. she has never used smokeless tobacco. She reports that she does not drink alcohol or use drugs.  The patient is accompanied by her husband today.  Allergies:  Allergies  Allergen Reactions  . Lisinopril Cough    Current Medications: Current Outpatient Medications  Medication Sig Dispense Refill  . Acetaminophen (TYLENOL ARTHRITIS EXT RELIEF PO) Take 1 tablet by mouth daily as needed (pain).    [name] aspirin 81 MG chewable tablet Chew 81 mg by mouth daily.     . AZELASTINE & FLUTICASONE NA 1 spray each nase twice daily.    . calcium-vitamin D (CALCIUM 500/D) 500-200 MG-UNIT tablet Take 1 tablet by mouth 2 (two) times daily.     . famotidine (PEPCID) 20 MG tablet Take 20 mg by mouth daily.     . Multiple Vitamin (MULTI-VITAMINS) TABS Take 1 tablet by mouth daily.     . Multiple Vitamins-Minerals (PRESERVISION AREDS 2 PO) Take 1 tablet by mouth daily.     . NON FORMULARY Take 1 tablet by mouth daily. Tumeric-root extract tablet    . Probiotic Product (PROBIOTIC DAILY PO) Take 2 tablets by mouth daily.     . traZODone (DESYREL) 50 MG tablet TAKE 1 TO 2 TABLETS BY MOUTH DAILY AT BEDTIME AS NEEDED    .  triamcinolone cream (KENALOG) 0.1 % Apply 1 application topically 2 (two) times daily.     . Turmeric Curcumin 500 MG CAPS Take 1 capsule by mouth daily.      No current facility-administered medications for this visit.     Review of Systems:  GENERAL:  Doing "ok".  No fevers or sweats.  Weight down 2 pounds. PERFORMANCE STATUS (ECOG):  2 HEENT:  No visual changes, runny nose, sore throat, mouth sores or tenderness. Lungs: No shortness of breath or cough.  No hemoptysis. Cardiac:  No chest pain, palpitations, orthopnea, or PND.  Heart flutters. GI:  Decrease appetite.  Intermittent abdominal pain.  No nausea, vomiting, diarrhea, constipation, melena or hematochezia. GU:  No  urgency, frequency, dysuria, or hematuria. Musculoskeletal:  Right shoulder pain.  No back pain.  No muscle tenderness. Extremities:  No pain or swelling. Skin:  No rashes or skin changes. Neuro:  Balance issues.  No headache, numbness or weakness or coordination issues. Endocrine:  No diabetes, thyroid issues, hot flashes or night sweats.  Dr. Astrid Divine monitoring thyroid function. Psych:  No mood changes, depression or anxiety. Pain:  No pain. Review of systems:  All other systems reviewed and found to be negative.  Physical Exam: Blood pressure (!) 142/87, pulse 67, temperature (!) 97.3 F (36.3 C), temperature source Tympanic, resp. rate 18, weight 106 lb (48.1 kg). GENERAL:  Thin elderly woman sitting comfortably in the exam room in no acute distress. MENTAL STATUS:  Alert and oriented to person, place and time. HEAD:  Curly gray hair.  Normocephalic, atraumatic, face symmetric, no Cushingoid features. EYES:  Glasses.  Blue eyes.  No conjunctivitis or scleral icterus. BACK:  Biopsy site unremarkable. NEUROLOGICAL: Unremarkable. PSYCH:  Appropriate.  Imaging studies: 09/02/2013:  Chest, abdomen, and pelvic CT revealed no significant interval change. She has some mildly enlarged intra-aortic caval retroperitoneal nodes measuring 1.6 x 1.2 cm. There were stable subcentimeter retroperitoneal nodes. 09/07/2014:  Chest, abdomen, and pelvic CT revealed no evidence of lymphoma recurrence.   09/26/2015:  Chest, abdomen, and pelvic CT revealed stable portacaval (2.5 cm) and aortocaval (1.0 cm) lymph nodes and mild hepatomegaly.   10/29/2017:  Chest, abdomen, and pelvic CT revealed an enlarged mass in the porta hepatis at site of prior presumed lymph node. Differential would include isolated recurrent lymphadenopathy from non-Hodgkin's lymphoma versus gastrointestinal stromal tumor. Lesion was 5.8 x 4.9 cm (compared to 2.5 x 3.3 cm on 09/26/2015).  This lesion has been present but smaller at this  site since 2015.  It was not present on PET-CT scan of 2009.  There was no additional metastatic disease evident in the chest, abdomen or pelvis.   No visits with results within 3 Day(s) from this visit.  Latest known visit with results is:  Hospital Outpatient Visit on 11/20/2017  Component Date Value Ref Range Status  . aPTT 11/20/2017 26  24 - 36 seconds Final  . WBC 11/20/2017 6.7  3.6 - 11.0 K/uL Final  . RBC 11/20/2017 4.37  3.80 - 5.20 MIL/uL Final  . Hemoglobin 11/20/2017 14.2  12.0 - 16.0 g/dL Final  . HCT 11/20/2017 42.0  35.0 - 47.0 % Final  . MCV 11/20/2017 96.2  80.0 - 100.0 fL Final  . MCH 11/20/2017 32.6  26.0 - 34.0 pg Final  . MCHC 11/20/2017 33.9  32.0 - 36.0 g/dL Final  . RDW 11/20/2017 13.2  11.5 - 14.5 % Final  . Platelets 11/20/2017 231  150 - 440 K/uL Final  .  Prothrombin Time 11/20/2017 12.2  11.4 - 15.2 seconds Final  . INR 11/20/2017 0.91   Final  . SURGICAL PATHOLOGY 11/20/2017    Final                   Value:Surgical Pathology CASE: [id number] PATIENT: Dorcas Mcmurray Surgical Pathology Report     SPECIMEN SUBMITTED: A. Lymph node, right side of kidney  CLINICAL HISTORY: Follicular lymphoma  PRE-OPERATIVE DIAGNOSIS: Follicular lymphoma  POST-OPERATIVE DIAGNOSIS: None provided.     DIAGNOSIS: A.  LYMPH NODE, RIGHT SIDE OF KIDNEY; CT GUIDED BIOPSY: - CONSISTENT WITH FOLLICULAR LYMPHOMA, GRADE 1.  Note: Immunostains were performed and confirm the diagnosis.  Positive: CD10, BCL-2, BCL-6, Ki67 (15-20%); Negative: Cyclin D1 and CD5 The control slides stained appropriately. A sample was sent for flow cytometry ([employer], Accession [id number]) with the following interpretation: CD10+ clonal B-cell population detected.   GROSS DESCRIPTION:  A. Labeled: patient's name and medical record number  Tissue fragment(s): 5  Size: 1.0-1.7 cm in length and in diameter 0.1 cm  Description: tan to brown cores, touch prep  prepared with Diff Quik                          and following touch preparation 1 core submitted in RPMI media for flow cytometry, remaining tissue formalin fixed wrapped in lens paper and submitted in a mesh bag  Entirely submitted in 1-2 cassette(s).    Final Diagnosis performed by Delorse Lek, MD.  Electronically signed 11/25/2017 12:39:25PM    The electronic signature indicates that the named Attending Pathologist has evaluated the specimen  Technical component performed at [facility], [address], [place], [place] [address] Lab: [phone] Dir: Rush Farmer, MD, MMM  Professional component performed at [facility], [facility], [place], [place], [place] [address] Lab: [phone] Dir: Dellia Nims. Reuel Derby, MD      Assessment:  NAKITA SANTERRE is a 83 y.o. female with a history of stage IIIA follicular non-Hodgkin's lymphoma.  She presented in the summer of 2003 with an epigastric and left upper quadrant mass and left arm weakness with decreased mobility. Abdominal ultrasound revealed a large mass in the head of the pancreas with  multiple enlarged lymph nodes and a large lobulated mass in the right retroperitoneal area.  MRI of the left brachial plexus revealed a 2.5 cm soft tissue mass and abnormal lymph node along the left brachial plexus and in the supraclavicular region.  Cervical node biopsy on 08/06/2002 revealed a grade I follicular non-Hodgkin's lymphoma.  Bone marrow on 08/09/2002 was negative.  Lumbar puncture was negative.  She received Leukeran and Rituxan. As her arm pain and mobility was not relieved, she received local radiation to the left brachial plexus area. Leukeran continued through 02/28/2013 (discontinued secondary side effects).  She developed progressive disease in 10/2003. Patient received CVP chemotherapy beginning 01/02/2004. In 06/2004 she was switched to CNOP.  She received 8 cycles completing on 01/15/2005.  She began  maintenance Rituxan on 04/23/2004 (4 weekly doses every 3 months for 2 years). Therapy completed on 09/13/2008.    Excision of a 1 cm right preauricular mass on 10/04/2017 revealed a low grade follicular lymphoma.  CT guided biopsy of the retroperitoneal nodal mass on 11/20/2017 revealed grade I follicular lymphoma.  Immunohistochemistry revealed positive for CD10, BCL-2, BCL-6, Ki67 (15-20%) and negative for cyclin D1 and CD5.  Flow cytometry revealed a CD10+ clonal B-cell population.   Chest, abdomen,  and pelvic CT on 10/29/2017 revealed an enlarged mass in the porta hepatis at site of prior presumed lymph node. Differential would include isolated recurrent lymphadenopathy from non-Hodgkin's lymphoma versus gastrointestinal stromal tumor. Lesion was 5.8 x 4.9 cm (compared to 2.5 x 3.3 cm on 09/26/2015).    The following studies were negative on 10/23/2017:  hepatitis B core antibody, hepatitis B surface antigen, hepatitis C antibody.  G6PD assay was normal.  She was admitted to [facility] from 08/15/2015 - 08/25/2015 with bloody diarrhea then hemolytic uremic syndrome related to E. coli 0157.  She had a complicated course with acute renal failure and anemia.  She was then in rehab for 3 weeks.  She continues to work on her balance.  Symptomatically, she has pain in her RIGHT shoulder. She denies any abdominal symptoms.  She has lost 2 pounds.  Exam reveals no adenopathy or hepatosplenomegaly.   Plan: 1.  Review retroperitoneal lymph node biopsy. Results revealed for a grade I follicular lymphoma.  Discuss length of time (9 years) since progressive disease.  Given age and minimal disease, discuss treatment with weekly single agent Rituxan x 4, followed by maintenance therapy.  Restaging studies after completion of induction therapy.  Discuss other agents if disease unresponsive.  Patient wishes to start treatment after first of year (12/24/2017). 2.  Discuss potential risk for development of tumor lysis  syndrome. Discuss continuous monitoring of labs during the treatment period. Discussed the risk of elevated uric acid that is potentially associated with her treatment. Will send in Rx for: allopurinol 300 mg daily 3.  Preauthorize Rituxan 4.  Schedule for chemotherapy class.  Nursing to assess IV access. 5.  RTC on 12/24/2017  for MD assessment, labs (CBC with diff, CMP, LDH, uric acid), and week #1 Rituxan.    Honor Loh, NP  11/27/2017, 4:25 PM   I saw and evaluated the patient, participating in the key portions of the service and reviewing pertinent diagnostic studies and records.  I reviewed the nurse practitioner's note and agree with the findings and the plan.  The assessment and plan were discussed with the patient.  Multiple questions were asked by the patient and answered.   Nolon Stalls, MD 11/27/2017,4:25 PM

## 2017-11-27 NOTE — Progress Notes (Signed)
Patient recently had surgery on her leg to have a cancer removed.

## 2017-11-29 ENCOUNTER — Encounter: Payer: Self-pay | Admitting: Hematology and Oncology

## 2017-11-29 DIAGNOSIS — C8200 Follicular lymphoma grade I, unspecified site: Secondary | ICD-10-CM | POA: Insufficient documentation

## 2017-11-29 NOTE — Progress Notes (Signed)
START ON PATHWAY REGIMEN - Lymphoma and CLL     Administer weekly:     Rituximab   **Always confirm dose/schedule in your pharmacy ordering system**    Patient Characteristics: Follicular Lymphoma, Grades 1, 2, and 3A, First Line, Stage III / IV, Symptomatic or Bulky Disease Disease Type: Follicular Lymphoma Disease Type: Not Applicable Disease Type: Not Applicable [place] Stage: III Tumor Grade: 3A Line of therapy: First Line Disease Characteristics: Symptomatic or Bulky Disease Intent of Therapy: Non-Curative / Palliative Intent, Discussed with Patient

## 2017-12-02 NOTE — Patient Instructions (Signed)

## 2017-12-03 ENCOUNTER — Inpatient Hospital Stay: Payer: Medicare Other

## 2017-12-23 ENCOUNTER — Other Ambulatory Visit: Payer: Self-pay | Admitting: Hematology and Oncology

## 2017-12-24 ENCOUNTER — Inpatient Hospital Stay (HOSPITAL_BASED_OUTPATIENT_CLINIC_OR_DEPARTMENT_OTHER): Payer: Medicare Other | Admitting: Hematology and Oncology

## 2017-12-24 ENCOUNTER — Inpatient Hospital Stay: Payer: Medicare Other | Attending: Hematology and Oncology

## 2017-12-24 ENCOUNTER — Other Ambulatory Visit: Payer: Self-pay | Admitting: Hematology and Oncology

## 2017-12-24 ENCOUNTER — Other Ambulatory Visit: Payer: Self-pay

## 2017-12-24 ENCOUNTER — Encounter: Payer: Self-pay | Admitting: Hematology and Oncology

## 2017-12-24 ENCOUNTER — Inpatient Hospital Stay: Payer: Medicare Other

## 2017-12-24 VITALS — BP 149/71 | HR 83

## 2017-12-24 VITALS — BP 116/69 | HR 68 | Temp 97.1°F | Resp 16 | Ht 64.0 in | Wt 108.6 lb

## 2017-12-24 DIAGNOSIS — M810 Age-related osteoporosis without current pathological fracture: Secondary | ICD-10-CM

## 2017-12-24 DIAGNOSIS — C8203 Follicular lymphoma grade I, intra-abdominal lymph nodes: Secondary | ICD-10-CM | POA: Diagnosis not present

## 2017-12-24 DIAGNOSIS — Z7982 Long term (current) use of aspirin: Secondary | ICD-10-CM

## 2017-12-24 DIAGNOSIS — Z923 Personal history of irradiation: Secondary | ICD-10-CM | POA: Diagnosis not present

## 2017-12-24 DIAGNOSIS — E871 Hypo-osmolality and hyponatremia: Secondary | ICD-10-CM

## 2017-12-24 DIAGNOSIS — M25511 Pain in right shoulder: Secondary | ICD-10-CM | POA: Diagnosis not present

## 2017-12-24 DIAGNOSIS — Z7189 Other specified counseling: Secondary | ICD-10-CM

## 2017-12-24 DIAGNOSIS — C8200 Follicular lymphoma grade I, unspecified site: Secondary | ICD-10-CM

## 2017-12-24 DIAGNOSIS — F419 Anxiety disorder, unspecified: Secondary | ICD-10-CM

## 2017-12-24 DIAGNOSIS — Z9221 Personal history of antineoplastic chemotherapy: Secondary | ICD-10-CM | POA: Insufficient documentation

## 2017-12-24 DIAGNOSIS — Z800 Family history of malignant neoplasm of digestive organs: Secondary | ICD-10-CM | POA: Diagnosis not present

## 2017-12-24 DIAGNOSIS — I10 Essential (primary) hypertension: Secondary | ICD-10-CM | POA: Insufficient documentation

## 2017-12-24 DIAGNOSIS — Z8673 Personal history of transient ischemic attack (TIA), and cerebral infarction without residual deficits: Secondary | ICD-10-CM

## 2017-12-24 DIAGNOSIS — Z5112 Encounter for antineoplastic immunotherapy: Secondary | ICD-10-CM

## 2017-12-24 DIAGNOSIS — Z79899 Other long term (current) drug therapy: Secondary | ICD-10-CM | POA: Diagnosis not present

## 2017-12-24 DIAGNOSIS — R5383 Other fatigue: Secondary | ICD-10-CM | POA: Insufficient documentation

## 2017-12-24 DIAGNOSIS — R531 Weakness: Secondary | ICD-10-CM | POA: Diagnosis not present

## 2017-12-24 LAB — CBC WITH DIFFERENTIAL/PLATELET
Basophils Absolute: 0 10*3/uL (ref 0–0.1)
Basophils Relative: 0 %
Eosinophils Absolute: 0.2 10*3/uL (ref 0–0.7)
Eosinophils Relative: 4 %
HCT: 37.4 % (ref 35.0–47.0)
Hemoglobin: 12.7 g/dL (ref 12.0–16.0)
Lymphocytes Relative: 8 %
Lymphs Abs: 0.4 10*3/uL — ABNORMAL LOW (ref 1.0–3.6)
MCH: 32.8 pg (ref 26.0–34.0)
MCHC: 34 g/dL (ref 32.0–36.0)
MCV: 96.6 fL (ref 80.0–100.0)
Monocytes Absolute: 0.9 10*3/uL (ref 0.2–0.9)
Monocytes Relative: 16 %
Neutro Abs: 4 10*3/uL (ref 1.4–6.5)
Neutrophils Relative %: 72 %
Platelets: 232 10*3/uL (ref 150–440)
RBC: 3.87 MIL/uL (ref 3.80–5.20)
RDW: 13.1 % (ref 11.5–14.5)
WBC: 5.6 10*3/uL (ref 3.6–11.0)

## 2017-12-24 LAB — COMPREHENSIVE METABOLIC PANEL
ALT: 22 U/L (ref 14–54)
AST: 26 U/L (ref 15–41)
Albumin: 3.9 g/dL (ref 3.5–5.0)
Alkaline Phosphatase: 70 U/L (ref 38–126)
Anion gap: 9 (ref 5–15)
BUN: 20 mg/dL (ref 6–20)
CO2: 26 mmol/L (ref 22–32)
Calcium: 9 mg/dL (ref 8.9–10.3)
Chloride: 96 mmol/L — ABNORMAL LOW (ref 101–111)
Creatinine, Ser: 0.58 mg/dL (ref 0.44–1.00)
GFR calc Af Amer: >60 mL/min (ref >60)
GFR calc non Af Amer: >60 mL/min (ref >60)
Glucose, Bld: 84 mg/dL (ref 65–99)
Potassium: 3.9 mmol/L (ref 3.5–5.1)
Sodium: 131 mmol/L — ABNORMAL LOW (ref 135–145)
Total Bilirubin: 0.7 mg/dL (ref 0.3–1.2)
Total Protein: 6.2 g/dL — ABNORMAL LOW (ref 6.5–8.1)

## 2017-12-24 LAB — LACTATE DEHYDROGENASE: LDH: 128 U/L (ref 98–192)

## 2017-12-24 LAB — URIC ACID: Uric Acid, Serum: 2.6 mg/dL (ref 2.3–6.6)

## 2017-12-24 MED ORDER — ACETAMINOPHEN 325 MG PO TABS
650.0000 mg | ORAL_TABLET | Freq: Once | ORAL | Status: AC
  Administered 2017-12-24: 650 mg via ORAL
  Filled 2017-12-24: qty 2

## 2017-12-24 MED ORDER — SODIUM CHLORIDE 0.9 % IV SOLN
Freq: Once | INTRAVENOUS | Status: AC
  Administered 2017-12-24: 10:00:00 via INTRAVENOUS
  Filled 2017-12-24: qty 1000

## 2017-12-24 MED ORDER — DIPHENHYDRAMINE HCL 25 MG PO CAPS
50.0000 mg | ORAL_CAPSULE | Freq: Once | ORAL | Status: AC
  Administered 2017-12-24: 50 mg via ORAL
  Filled 2017-12-24: qty 2

## 2017-12-24 MED ORDER — RITUXIMAB CHEMO INJECTION 500 MG/50ML
375.0000 mg/m2 | Freq: Once | INTRAVENOUS | Status: AC
  Administered 2017-12-24: 600 mg via INTRAVENOUS
  Filled 2017-12-24: qty 50

## 2017-12-24 NOTE — Progress Notes (Signed)
See follow up. 

## 2017-12-24 NOTE — Patient Instructions (Signed)
Bags from chemotherapy class were provided through the survivorship fund. Sandy Hess heads up this initiative.

## 2017-12-24 NOTE — Progress Notes (Signed)
[facility] day:  12/24/17  Chief Complaint: [name] is a 84 y.o. female with a history of stage IIIA follicular non-Hodgkin's lymphoma who is seen for assessment prior to initiation of Rituxan.  HPI: The patient was last seen in the medical oncology clinic on 11/27/2017.  At that time, she described pain in her RIGHT shoulder. She denied any abdominal symptoms.  She had lost 2 pounds.  Exam revealed no adenopathy or hepatosplenomegaly.  We discussed initiation of therapy after the first of the year.  She started allopurinol.  Patient attended chemotherapy class on 12/03/2017.  During the interim, patient is doing well. She notes that she has been having some anxiety. Patient denies any fevers, sweats, or weight loss. Patient denies recent illness. Patient continues on the prescribed Allopurinol.    Past Medical History:  Diagnosis Date  . Cancer [facility]) 2013   Non-Hodgkin Lymphoma with chemo and rad tx  . Follicular lymphoma grade I ([place]) 08/06/2002  . 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 Hypertension   . Meniere's disease    with bilateral hearing loss  . Mini stroke [facility])    summer 2014  . Osteoporosis     Past Surgical History:  Procedure Laterality Date  . BONE MARROW BIOPSY  08/09/2002  . BREAST LUMPECTOMY  1950s   benign  . CATARACT EXTRACTION  03/2009  . LYMPH NODE BIOPSY  08/07/2002   cervical  . TUBAL LIGATION      Family History  Problem Relation Age of Onset  . Cancer Father        Throat  . Cancer Sister 41       Colon    Social History:  reports that  has never smoked. she has never used smokeless tobacco. She reports that she does not drink alcohol or use drugs.  The patient is accompanied by her husband today.  Allergies:  Allergies  Allergen Reactions  . Lisinopril Cough    Current Medications: Current Outpatient Medications  Medication Sig Dispense Refill  .  Acetaminophen (TYLENOL ARTHRITIS EXT RELIEF PO) Take 1 tablet by mouth daily as needed (pain).    [name] allopurinol (ZYLOPRIM) 300 MG tablet Take 1 tablet (300 mg total) by mouth daily. 30 tablet 1  . aspirin 81 MG chewable tablet Chew 81 mg by mouth daily.     . AZELASTINE & FLUTICASONE NA 1 spray each nase twice daily.    . calcium-vitamin D (CALCIUM 500/D) 500-200 MG-UNIT tablet Take 1 tablet by mouth 2 (two) times daily.     . famotidine (PEPCID) 20 MG tablet Take 20 mg by mouth daily.     . Multiple Vitamin (MULTI-VITAMINS) TABS Take 1 tablet by mouth daily.     . Multiple Vitamins-Minerals (PRESERVISION AREDS 2 PO) Take 1 tablet by mouth daily.     . NON FORMULARY Take 1 tablet by mouth daily. Tumeric-root extract tablet    . Probiotic Product (PROBIOTIC DAILY PO) Take 2 tablets by mouth daily.     . traZODone (DESYREL) 50 MG tablet TAKE 1 TO 2 TABLETS BY MOUTH DAILY AT BEDTIME AS NEEDED    . triamcinolone cream (KENALOG) 0.1 % Apply 1 application topically 2 (two) times daily.     . Turmeric Curcumin 500 MG CAPS Take 1 capsule by mouth daily.      No current facility-administered medications for this visit.     Review of Systems:  GENERAL:  Doing "well".  No fevers or sweats.  Weight up 2 pounds. PERFORMANCE STATUS (ECOG):  2 HEENT:  No visual changes, runny nose, sore throat, mouth sores or tenderness. Lungs: No shortness of breath or cough.  No hemoptysis. Cardiac:  No chest pain, palpitations, orthopnea, or PND.  Heart flutters. GI:  Decrease appetite.  Intermittent abdominal pain.  No nausea, vomiting, diarrhea, constipation, melena or hematochezia. GU:  No urgency, frequency, dysuria, or hematuria. Musculoskeletal:  Right shoulder pain.  No back pain.  No muscle tenderness. Extremities:  No pain or swelling. Skin:  No rashes or skin changes. Neuro:  Balance issues.  No headache, numbness or weakness or coordination issues. Endocrine:  No diabetes, thyroid issues, hot flashes or  night sweats.  Dr. Astrid Divine monitoring thyroid function. Psych:  Little anxiety.  No mood changes or depression. Pain:  No pain. Review of systems:  All other systems reviewed and found to be negative.  Physical Exam: Blood pressure 116/69, pulse 68, temperature (!) 97.1 F (36.2 C), temperature source Tympanic, resp. rate 16, height 5' 4"  (1.626 m), weight 108 lb 9.6 oz (49.3 kg). GENERAL:  Thin elderly woman sitting comfortably in the exam room in no acute distress. MENTAL STATUS:  Alert and oriented to person, place and time. HEAD:  Curly gray hair.  Normocephalic, atraumatic, face symmetric, no Cushingoid features. EYES:  Glasses.  Blue eyes.  Pupils equal round and reactive to light and accomodation.  No conjunctivitis or scleral icterus. ENT:  Oropharynx clear without lesion.  Tongue normal. Mucous membranes moist.  RESPIRATORY:  Clear to auscultation without rales, wheezes or rhonchi. CARDIOVASCULAR:  Regular rate and rhythm without murmur, rub or gallop. ABDOMEN:  Soft, non-tender, with active bowel sounds, and no hepatosplenomegaly.  No masses. SKIN:  No rashes, ulcers or lesions. EXTREMITIES: No edema, no skin discoloration or tenderness.  No palpable cords. LYMPH NODES: No palpable cervical, supraclavicular, axillary or inguinal adenopathy  NEUROLOGICAL: Unremarkable. PSYCH:  Appropriate.   Imaging studies: 09/02/2013:  Chest, abdomen, and pelvic CT revealed no significant interval change. She has some mildly enlarged intra-aortic caval retroperitoneal nodes measuring 1.6 x 1.2 cm. There were stable subcentimeter retroperitoneal nodes. 09/07/2014:  Chest, abdomen, and pelvic CT revealed no evidence of lymphoma recurrence.   09/26/2015:  Chest, abdomen, and pelvic CT revealed stable portacaval (2.5 cm) and aortocaval (1.0 cm) lymph nodes and mild hepatomegaly.   10/29/2017:  Chest, abdomen, and pelvic CT revealed an enlarged mass in the porta hepatis at site of prior presumed  lymph node. Differential would include isolated recurrent lymphadenopathy from non-Hodgkin's lymphoma versus gastrointestinal stromal tumor. Lesion was 5.8 x 4.9 cm (compared to 2.5 x 3.3 cm on 09/26/2015).  This lesion has been present but smaller at this site since 2015.  It was not present on PET-CT scan of 2009.  There was no additional metastatic disease evident in the chest, abdomen or pelvis.   Appointment on 12/24/2017  Component Date Value Ref Range Status  . LDH 12/24/2017 128  98 - 192 U/L Final   Performed at [facility], [address] [place], [place] [address]  . Sodium 12/24/2017 131* 135 - 145 mmol/L Final  . Potassium 12/24/2017 3.9  3.5 - 5.1 mmol/L Final  . Chloride 12/24/2017 96* 101 - 111 mmol/L Final  . CO2 12/24/2017 26  22 - 32 mmol/L Final  . Glucose, Bld 12/24/2017 84  65 - 99 mg/dL Final  . BUN 12/24/2017 20  6 - 20  mg/dL Final  . Creatinine, Ser 12/24/2017 0.58  0.44 - 1.00 mg/dL Final  . Calcium 12/24/2017 9.0  8.9 - 10.3 mg/dL Final  . Total Protein 12/24/2017 6.2* 6.5 - 8.1 g/dL Final  . Albumin 12/24/2017 3.9  3.5 - 5.0 g/dL Final  . AST 12/24/2017 26  15 - 41 U/L Final  . ALT 12/24/2017 22  14 - 54 U/L Final  . Alkaline Phosphatase 12/24/2017 70  38 - 126 U/L Final  . Total Bilirubin 12/24/2017 0.7  0.3 - 1.2 mg/dL Final  . GFR calc non Af Amer 12/24/2017 >60  >60 mL/min Final  . GFR calc Af Amer 12/24/2017 >60  >60 mL/min Final   Comment: (NOTE) The eGFR has been calculated using the CKD EPI equation. This calculation has not been validated in all clinical situations. eGFR's persistently <60 mL/min signify possible Chronic Kidney Disease.   Georgiann Hahn gap 12/24/2017 9  5 - 15 Final   Performed at [facility], [address] [place], [place] [address]  . WBC 12/24/2017 5.6  3.6 - 11.0 K/uL Final  . RBC 12/24/2017 3.87  3.80 - 5.20 MIL/uL Final  . Hemoglobin 12/24/2017 12.7  12.0 - 16.0 g/dL Final  . HCT 12/24/2017 37.4  35.0 - 47.0 %  Final  . MCV 12/24/2017 96.6  80.0 - 100.0 fL Final  . MCH 12/24/2017 32.8  26.0 - 34.0 pg Final  . MCHC 12/24/2017 34.0  32.0 - 36.0 g/dL Final  . RDW 12/24/2017 13.1  11.5 - 14.5 % Final  . Platelets 12/24/2017 232  150 - 440 K/uL Final  . Neutrophils Relative % 12/24/2017 72  % Final  . Neutro Abs 12/24/2017 4.0  1.4 - 6.5 K/uL Final  . Lymphocytes Relative 12/24/2017 8  % Final  . Lymphs Abs 12/24/2017 0.4* 1.0 - 3.6 K/uL Final  . Monocytes Relative 12/24/2017 16  % Final  . Monocytes Absolute 12/24/2017 0.9  0.2 - 0.9 K/uL Final  . Eosinophils Relative 12/24/2017 4  % Final  . Eosinophils Absolute 12/24/2017 0.2  0 - 0.7 K/uL Final  . Basophils Relative 12/24/2017 0  % Final  . Basophils Absolute 12/24/2017 0.0  0 - 0.1 K/uL Final   Performed at [facility], [address], [place],  [address]    Assessment:  MIANNA IEZZI is a 84 y.o. female with a history of stage IIIA follicular non-Hodgkin's lymphoma.  She presented in the summer of 2003 with an epigastric and left upper quadrant mass and left arm weakness with decreased mobility. Abdominal ultrasound revealed a large mass in the head of the pancreas with  multiple enlarged lymph nodes and a large lobulated mass in the right retroperitoneal area.  MRI of the left brachial plexus revealed a 2.5 cm soft tissue mass and abnormal lymph node along the left brachial plexus and in the supraclavicular region.  Cervical node biopsy on 08/06/2002 revealed a grade I follicular non-Hodgkin's lymphoma.  Bone marrow on 08/09/2002 was negative.  Lumbar puncture was negative.  She received Leukeran and Rituxan. As her arm pain and mobility was not relieved, she received local radiation to the left brachial plexus area. Leukeran continued through 02/28/2013 (discontinued secondary side effects).  She developed progressive disease in 10/2003. Patient received CVP chemotherapy beginning 01/02/2004. In 06/2004 she was switched to CNOP.   She received 8 cycles completing on 01/15/2005.  She began maintenance Rituxan on 04/23/2004 (4 weekly doses every 3 months for 2 years).  Therapy completed on 09/13/2008.    Excision of a 1 cm right preauricular mass on 10/04/2017 revealed a low grade follicular lymphoma.  CT guided biopsy of the retroperitoneal nodal mass on 11/20/2017 revealed grade I follicular lymphoma.  Immunohistochemistry revealed positive for CD10, BCL-2, BCL-6, Ki67 (15-20%) and negative for cyclin D1 and CD5.  Flow cytometry revealed a CD10+ clonal B-cell population.   Chest, abdomen, and pelvic CT on 10/29/2017 revealed an enlarged mass in the porta hepatis at site of prior presumed lymph node. Differential would include isolated recurrent lymphadenopathy from non-Hodgkin's lymphoma versus gastrointestinal stromal tumor. Lesion was 5.8 x 4.9 cm (compared to 2.5 x 3.3 cm on 09/26/2015).    The following studies were negative on 10/23/2017:  hepatitis B core antibody, hepatitis B surface antigen, hepatitis C antibody.  G6PD assay was normal.  She was admitted to [facility] from 08/15/2015 - 08/25/2015 with bloody diarrhea then hemolytic uremic syndrome related to E. coli 0157.  She had a complicated course with acute renal failure and anemia.  She was then in rehab for 3 weeks.  She continues to work on her balance.  Symptomatically, she is doing well.  She denies any abdominal symptoms. Patient has some anxiety. She has gained 2 pounds.  Exam reveals no adenopathy or hepatosplenomegaly. Sodium low at 131.   Plan: 1.  Labs today:  CBC with diff, CMP, LDH, uric acid. 2.  Blood counts stable and adequate for treatment. Will proceed with week #1 Rituxan.  Patient consented to treatment. 3.  Continue allopurinol as previously prescribed.  4.  Discuss hyponatremia. Sodium 131. Encouraged patient to hydrate using ORT solutions such as Gatorade.  5.  RTC weekly x 2 for labs (CBC with diff, CMP, uric acid) and Rituxan. 6.  RTC in 3 weeks  for labs (CBC with diff, CMP, uric acid) and week #4 Rituxan.   Honor Loh, NP  12/24/2017, 9:28 AM   I saw and evaluated the patient, participating in the key portions of the service and reviewing pertinent diagnostic studies and records.  I reviewed the nurse practitioner's note and agree with the findings and the plan.  The assessment and plan were discussed with the patient.  Multiple questions were asked by the patient and answered.   Nolon Stalls, MD 12/24/2017,9:28 AM

## 2017-12-28 ENCOUNTER — Encounter: Payer: Self-pay | Admitting: Hematology and Oncology

## 2017-12-31 ENCOUNTER — Other Ambulatory Visit: Payer: Self-pay | Admitting: Hematology and Oncology

## 2017-12-31 ENCOUNTER — Inpatient Hospital Stay: Payer: Medicare Other

## 2017-12-31 VITALS — BP 117/70 | HR 60 | Temp 98.2°F | Resp 18 | Wt 108.0 lb

## 2017-12-31 DIAGNOSIS — C8200 Follicular lymphoma grade I, unspecified site: Secondary | ICD-10-CM

## 2017-12-31 DIAGNOSIS — Z5112 Encounter for antineoplastic immunotherapy: Secondary | ICD-10-CM | POA: Diagnosis not present

## 2017-12-31 DIAGNOSIS — C8203 Follicular lymphoma grade I, intra-abdominal lymph nodes: Secondary | ICD-10-CM

## 2017-12-31 LAB — COMPREHENSIVE METABOLIC PANEL
ALT: 30 U/L (ref 14–54)
AST: 23 U/L (ref 15–41)
Albumin: 3.7 g/dL (ref 3.5–5.0)
Alkaline Phosphatase: 89 U/L (ref 38–126)
Anion gap: 6 (ref 5–15)
BUN: 20 mg/dL (ref 6–20)
CO2: 28 mmol/L (ref 22–32)
Calcium: 8.8 mg/dL — ABNORMAL LOW (ref 8.9–10.3)
Chloride: 98 mmol/L — ABNORMAL LOW (ref 101–111)
Creatinine, Ser: 0.55 mg/dL (ref 0.44–1.00)
GFR calc Af Amer: >60 mL/min (ref >60)
GFR calc non Af Amer: >60 mL/min (ref >60)
Glucose, Bld: 73 mg/dL (ref 65–99)
Potassium: 3.9 mmol/L (ref 3.5–5.1)
Sodium: 132 mmol/L — ABNORMAL LOW (ref 135–145)
Total Bilirubin: 0.6 mg/dL (ref 0.3–1.2)
Total Protein: 6.1 g/dL — ABNORMAL LOW (ref 6.5–8.1)

## 2017-12-31 LAB — CBC WITH DIFFERENTIAL/PLATELET
Basophils Absolute: 0 10*3/uL (ref 0–0.1)
Basophils Relative: 1 %
Eosinophils Absolute: 0.3 10*3/uL (ref 0–0.7)
Eosinophils Relative: 5 %
HCT: 37.2 % (ref 35.0–47.0)
Hemoglobin: 12.4 g/dL (ref 12.0–16.0)
Lymphocytes Relative: 7 %
Lymphs Abs: 0.4 10*3/uL — ABNORMAL LOW (ref 1.0–3.6)
MCH: 32.3 pg (ref 26.0–34.0)
MCHC: 33.4 g/dL (ref 32.0–36.0)
MCV: 96.7 fL (ref 80.0–100.0)
Monocytes Absolute: 0.8 10*3/uL (ref 0.2–0.9)
Monocytes Relative: 15 %
Neutro Abs: 4.1 10*3/uL (ref 1.4–6.5)
Neutrophils Relative %: 72 %
Platelets: 211 10*3/uL (ref 150–440)
RBC: 3.85 MIL/uL (ref 3.80–5.20)
RDW: 13 % (ref 11.5–14.5)
WBC: 5.6 10*3/uL (ref 3.6–11.0)

## 2017-12-31 LAB — URIC ACID: Uric Acid, Serum: 2.4 mg/dL (ref 2.3–6.6)

## 2017-12-31 MED ORDER — SODIUM CHLORIDE 0.9 % IV SOLN
Freq: Once | INTRAVENOUS | Status: AC
  Administered 2017-12-31: 10:00:00 via INTRAVENOUS
  Filled 2017-12-31: qty 1000

## 2017-12-31 MED ORDER — DIPHENHYDRAMINE HCL 25 MG PO CAPS
50.0000 mg | ORAL_CAPSULE | Freq: Once | ORAL | Status: AC
  Administered 2017-12-31: 25 mg via ORAL
  Filled 2017-12-31: qty 2

## 2017-12-31 MED ORDER — SODIUM CHLORIDE 0.9 % IV SOLN
375.0000 mg/m2 | Freq: Once | INTRAVENOUS | Status: AC
  Administered 2017-12-31: 600 mg via INTRAVENOUS
  Filled 2017-12-31: qty 10

## 2017-12-31 MED ORDER — SODIUM CHLORIDE 0.9 % IV SOLN: 375.0000 mg/m2 | Freq: Once | INTRAVENOUS | Status: DC

## 2017-12-31 MED ORDER — ACETAMINOPHEN 325 MG PO TABS
650.0000 mg | ORAL_TABLET | Freq: Once | ORAL | Status: AC
  Administered 2017-12-31: 650 mg via ORAL
  Filled 2017-12-31: qty 2

## 2018-01-07 ENCOUNTER — Inpatient Hospital Stay: Payer: Medicare Other

## 2018-01-07 ENCOUNTER — Other Ambulatory Visit: Payer: Self-pay | Admitting: Hematology and Oncology

## 2018-01-07 VITALS — BP 128/73 | HR 60 | Temp 97.4°F | Resp 20

## 2018-01-07 DIAGNOSIS — C8200 Follicular lymphoma grade I, unspecified site: Secondary | ICD-10-CM

## 2018-01-07 DIAGNOSIS — Z5112 Encounter for antineoplastic immunotherapy: Secondary | ICD-10-CM | POA: Diagnosis not present

## 2018-01-07 DIAGNOSIS — C8203 Follicular lymphoma grade I, intra-abdominal lymph nodes: Secondary | ICD-10-CM

## 2018-01-07 LAB — CBC WITH DIFFERENTIAL/PLATELET
Basophils Absolute: 0 10*3/uL (ref 0–0.1)
Basophils Relative: 1 %
Eosinophils Absolute: 0.2 10*3/uL (ref 0–0.7)
Eosinophils Relative: 3 %
HCT: 37.2 % (ref 35.0–47.0)
Hemoglobin: 12.6 g/dL (ref 12.0–16.0)
Lymphocytes Relative: 11 %
Lymphs Abs: 0.6 10*3/uL — ABNORMAL LOW (ref 1.0–3.6)
MCH: 32.5 pg (ref 26.0–34.0)
MCHC: 33.9 g/dL (ref 32.0–36.0)
MCV: 96.1 fL (ref 80.0–100.0)
Monocytes Absolute: 0.6 10*3/uL (ref 0.2–0.9)
Monocytes Relative: 11 %
Neutro Abs: 4.4 10*3/uL (ref 1.4–6.5)
Neutrophils Relative %: 74 %
Platelets: 293 10*3/uL (ref 150–440)
RBC: 3.87 MIL/uL (ref 3.80–5.20)
RDW: 13 % (ref 11.5–14.5)
WBC: 5.9 10*3/uL (ref 3.6–11.0)

## 2018-01-07 LAB — COMPREHENSIVE METABOLIC PANEL
ALT: 28 U/L (ref 14–54)
AST: 23 U/L (ref 15–41)
Albumin: 3.9 g/dL (ref 3.5–5.0)
Alkaline Phosphatase: 98 U/L (ref 38–126)
Anion gap: 8 (ref 5–15)
BUN: 18 mg/dL (ref 6–20)
CO2: 26 mmol/L (ref 22–32)
Calcium: 9.1 mg/dL (ref 8.9–10.3)
Chloride: 99 mmol/L — ABNORMAL LOW (ref 101–111)
Creatinine, Ser: 0.66 mg/dL (ref 0.44–1.00)
GFR calc Af Amer: >60 mL/min (ref >60)
GFR calc non Af Amer: >60 mL/min (ref >60)
Glucose, Bld: 79 mg/dL (ref 65–99)
Potassium: 4 mmol/L (ref 3.5–5.1)
Sodium: 133 mmol/L — ABNORMAL LOW (ref 135–145)
Total Bilirubin: 0.6 mg/dL (ref 0.3–1.2)
Total Protein: 6 g/dL — ABNORMAL LOW (ref 6.5–8.1)

## 2018-01-07 LAB — URIC ACID: Uric Acid, Serum: 2.9 mg/dL (ref 2.3–6.6)

## 2018-01-07 MED ORDER — SODIUM CHLORIDE 0.9 % IV SOLN
Freq: Once | INTRAVENOUS | Status: AC
  Administered 2018-01-07: 10:00:00 via INTRAVENOUS
  Filled 2018-01-07: qty 1000

## 2018-01-07 MED ORDER — ACETAMINOPHEN 325 MG PO TABS
650.0000 mg | ORAL_TABLET | Freq: Once | ORAL | Status: AC
  Administered 2018-01-07: 650 mg via ORAL
  Filled 2018-01-07: qty 2

## 2018-01-07 MED ORDER — SODIUM CHLORIDE 0.9 % IV SOLN: 375.0000 mg/m2 | Freq: Once | INTRAVENOUS | Status: DC

## 2018-01-07 MED ORDER — SODIUM CHLORIDE 0.9 % IV SOLN
375.0000 mg/m2 | Freq: Once | INTRAVENOUS | Status: AC
  Administered 2018-01-07: 600 mg via INTRAVENOUS
  Filled 2018-01-07: qty 50

## 2018-01-07 MED ORDER — DIPHENHYDRAMINE HCL 25 MG PO CAPS
50.0000 mg | ORAL_CAPSULE | Freq: Once | ORAL | Status: AC
  Administered 2018-01-07: 25 mg via ORAL
  Filled 2018-01-07: qty 2

## 2018-01-11 ENCOUNTER — Encounter: Payer: Self-pay | Admitting: Hematology and Oncology

## 2018-01-14 ENCOUNTER — Other Ambulatory Visit: Payer: Self-pay

## 2018-01-14 ENCOUNTER — Other Ambulatory Visit: Payer: Self-pay | Admitting: Hematology and Oncology

## 2018-01-14 ENCOUNTER — Inpatient Hospital Stay: Payer: Medicare Other

## 2018-01-14 ENCOUNTER — Inpatient Hospital Stay (HOSPITAL_BASED_OUTPATIENT_CLINIC_OR_DEPARTMENT_OTHER): Payer: Medicare Other | Admitting: Hematology and Oncology

## 2018-01-14 ENCOUNTER — Other Ambulatory Visit: Payer: Self-pay | Admitting: *Deleted

## 2018-01-14 VITALS — BP 123/78 | HR 74 | Temp 96.6°F | Resp 18 | Wt 108.9 lb

## 2018-01-14 VITALS — BP 104/64 | HR 62 | Temp 96.0°F | Resp 18

## 2018-01-14 DIAGNOSIS — Z8673 Personal history of transient ischemic attack (TIA), and cerebral infarction without residual deficits: Secondary | ICD-10-CM

## 2018-01-14 DIAGNOSIS — C8200 Follicular lymphoma grade I, unspecified site: Secondary | ICD-10-CM

## 2018-01-14 DIAGNOSIS — M810 Age-related osteoporosis without current pathological fracture: Secondary | ICD-10-CM

## 2018-01-14 DIAGNOSIS — Z7982 Long term (current) use of aspirin: Secondary | ICD-10-CM | POA: Diagnosis not present

## 2018-01-14 DIAGNOSIS — I10 Essential (primary) hypertension: Secondary | ICD-10-CM

## 2018-01-14 DIAGNOSIS — C8203 Follicular lymphoma grade I, intra-abdominal lymph nodes: Secondary | ICD-10-CM

## 2018-01-14 DIAGNOSIS — F419 Anxiety disorder, unspecified: Secondary | ICD-10-CM

## 2018-01-14 DIAGNOSIS — Z79899 Other long term (current) drug therapy: Secondary | ICD-10-CM

## 2018-01-14 DIAGNOSIS — R531 Weakness: Secondary | ICD-10-CM

## 2018-01-14 DIAGNOSIS — Z923 Personal history of irradiation: Secondary | ICD-10-CM

## 2018-01-14 DIAGNOSIS — R5383 Other fatigue: Secondary | ICD-10-CM | POA: Diagnosis not present

## 2018-01-14 DIAGNOSIS — Z9221 Personal history of antineoplastic chemotherapy: Secondary | ICD-10-CM | POA: Diagnosis not present

## 2018-01-14 DIAGNOSIS — Z5112 Encounter for antineoplastic immunotherapy: Secondary | ICD-10-CM

## 2018-01-14 DIAGNOSIS — Z7189 Other specified counseling: Secondary | ICD-10-CM

## 2018-01-14 LAB — BASIC METABOLIC PANEL
Anion gap: 8 (ref 5–15)
BUN: 25 mg/dL — ABNORMAL HIGH (ref 6–20)
CO2: 28 mmol/L (ref 22–32)
Calcium: 9.2 mg/dL (ref 8.9–10.3)
Chloride: 96 mmol/L — ABNORMAL LOW (ref 101–111)
Creatinine, Ser: 0.68 mg/dL (ref 0.44–1.00)
GFR calc Af Amer: >60 mL/min (ref >60)
GFR calc non Af Amer: >60 mL/min (ref >60)
Glucose, Bld: 81 mg/dL (ref 65–99)
Potassium: 4.1 mmol/L (ref 3.5–5.1)
Sodium: 132 mmol/L — ABNORMAL LOW (ref 135–145)

## 2018-01-14 LAB — CBC WITH DIFFERENTIAL/PLATELET
Basophils Absolute: 0.1 10*3/uL (ref 0–0.1)
Basophils Relative: 1 %
Eosinophils Absolute: 0.1 10*3/uL (ref 0–0.7)
Eosinophils Relative: 1 %
HCT: 38.9 % (ref 35.0–47.0)
Hemoglobin: 13.1 g/dL (ref 12.0–16.0)
Lymphocytes Relative: 9 %
Lymphs Abs: 0.5 10*3/uL — ABNORMAL LOW (ref 1.0–3.6)
MCH: 32.4 pg (ref 26.0–34.0)
MCHC: 33.7 g/dL (ref 32.0–36.0)
MCV: 96.3 fL (ref 80.0–100.0)
Monocytes Absolute: 0.8 10*3/uL (ref 0.2–0.9)
Monocytes Relative: 14 %
Neutro Abs: 4.6 10*3/uL (ref 1.4–6.5)
Neutrophils Relative %: 75 %
Platelets: 272 10*3/uL (ref 150–440)
RBC: 4.04 MIL/uL (ref 3.80–5.20)
RDW: 13.2 % (ref 11.5–14.5)
WBC: 6.1 10*3/uL (ref 3.6–11.0)

## 2018-01-14 LAB — URIC ACID: Uric Acid, Serum: 2.9 mg/dL (ref 2.3–6.6)

## 2018-01-14 MED ORDER — SODIUM CHLORIDE 0.9 % IV SOLN: 375.0000 mg/m2 | Freq: Once | INTRAVENOUS | Status: DC

## 2018-01-14 MED ORDER — DIPHENHYDRAMINE HCL 25 MG PO CAPS
25.0000 mg | ORAL_CAPSULE | Freq: Once | ORAL | Status: AC
  Administered 2018-01-14: 25 mg via ORAL
  Filled 2018-01-14: qty 1

## 2018-01-14 MED ORDER — ACETAMINOPHEN 325 MG PO TABS
650.0000 mg | ORAL_TABLET | Freq: Once | ORAL | Status: AC
  Administered 2018-01-14: 650 mg via ORAL
  Filled 2018-01-14: qty 2

## 2018-01-14 MED ORDER — SODIUM CHLORIDE 0.9 % IV SOLN
Freq: Once | INTRAVENOUS | Status: AC
  Administered 2018-01-14: 10:00:00 via INTRAVENOUS
  Filled 2018-01-14: qty 1000

## 2018-01-14 MED ORDER — SODIUM CHLORIDE 0.9 % IV SOLN
375.0000 mg/m2 | Freq: Once | INTRAVENOUS | Status: AC
  Administered 2018-01-14: 600 mg via INTRAVENOUS
  Filled 2018-01-14: qty 50

## 2018-01-14 NOTE — Progress Notes (Signed)
Sodium 132, BUN 25, still waiting on uric acid results. Per Brain NP okay to proceed with treatment.

## 2018-01-14 NOTE — Progress Notes (Signed)
[facility] day:  01/14/18  Chief Complaint: [name] is a 84 y.o. female with a history of stage IIIA follicular non-Hodgkin's lymphoma who is seen for assessment prior to week #4 Rituxan.  HPI: The patient was last seen in the medical oncology clinic on 12/24/2017.  At that time, she denied any abdominal discomcort or B symptoms.   Exam revealed no adenopathy or hepatosplenomegaly. Sodium was low at 131.  She received week #1 Rituxan.  She received Rituxan on 12/31/2017 and 01/07/2018.  Benadryl premedication was decreased to 25 mg.  Tumor lysis labs have remained normal.  Sodium was 133 on 01/07/2018.  During the interim, she has been fatigued and experiencing non-specific headaches. Headaches "do not last long". She denies any visual changes or focal weakness. Patient states, "I think the infusions are doing something to my body to weaken it. I feel zapped for 3-4 days after the infusions".   She denies any B symptoms or interval infections. She is eating well, and she has maintained her weight. She denies pain in the clinic today.    Past Medical History:  Diagnosis Date  . Cancer [facility]) 2013   Non-Hodgkin Lymphoma with chemo and rad tx  . Follicular lymphoma grade I ([place]) 08/06/2002  . 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 Hypertension   . Meniere's disease    with bilateral hearing loss  . Mini stroke [facility])    summer 2014  . Osteoporosis     Past Surgical History:  Procedure Laterality Date  . BONE MARROW BIOPSY  08/09/2002  . BREAST LUMPECTOMY  1950s   benign  . CATARACT EXTRACTION  03/2009  . LYMPH NODE BIOPSY  08/07/2002   cervical  . TUBAL LIGATION      Family History  Problem Relation Age of Onset  . Cancer Father        Throat  . Cancer Sister 16       Colon    Social History:  reports that  has never smoked. she has never used smokeless tobacco. She reports that she does not  drink alcohol or use drugs.  She lives in [place].  The patient is accompanied by her husband today.  Allergies:  Allergies  Allergen Reactions  . Lisinopril Cough    Current Medications: Current Outpatient Medications  Medication Sig Dispense Refill  . allopurinol (ZYLOPRIM) 300 MG tablet Take 1 tablet (300 mg total) by mouth daily. 30 tablet 1  . AZELASTINE & FLUTICASONE NA 1 spray each nase twice daily.    . calcium-vitamin D (CALCIUM 500/D) 500-200 MG-UNIT tablet Take 1 tablet by mouth 2 (two) times daily.     . famotidine (PEPCID) 20 MG tablet Take 20 mg by mouth daily.     . Multiple Vitamin (MULTI-VITAMINS) TABS Take 1 tablet by mouth daily.     . Multiple Vitamins-Minerals (PRESERVISION AREDS 2 PO) Take 1 tablet by mouth daily.     . Probiotic Product (PROBIOTIC DAILY PO) Take 2 tablets by mouth daily.     . traZODone (DESYREL) 50 MG tablet TAKE 1 TO 2 TABLETS BY MOUTH DAILY AT BEDTIME AS NEEDED    . triamcinolone cream (KENALOG) 0.1 % Apply 1 application topically 2 (two) times daily.     . Turmeric Curcumin 500 MG CAPS Take 1 capsule by mouth daily.     . Acetaminophen (TYLENOL ARTHRITIS EXT RELIEF PO) Take 1 tablet by  mouth daily as needed (pain).    [name] aspirin 81 MG chewable tablet Chew 81 mg by mouth daily.      No current facility-administered medications for this visit.     Review of Systems:  GENERAL:  Fatigue.  No fevers or sweats.  Weight stable. PERFORMANCE STATUS (ECOG):  2 HEENT:  No visual changes, runny nose, sore throat, mouth sores or tenderness. Lungs: No shortness of breath or cough.  No hemoptysis. Cardiac:  No chest pain, palpitations, orthopnea, or PND.  Heart flutters. GI:  No nausea, vomiting, diarrhea, constipation, melena or hematochezia. GU:  No urgency, frequency, dysuria, or hematuria. Musculoskeletal:  Osteoporosis.  No back pain.  No muscle tenderness. Extremities:  No pain or swelling. Skin:  No rashes or skin changes. Neuro:  Balance  issues.  No headache, numbness or weakness or coordination issues. Endocrine:  No diabetes, thyroid issues, hot flashes or night sweats.  Dr. Astrid Divine monitoring thyroid function. Psych:  Little anxiety.  No mood changes or depression. Pain:  No pain. Review of systems:  All other systems reviewed and found to be negative.  Physical Exam: Blood pressure 123/78, pulse 74, temperature (!) 96.6 F (35.9 C), temperature source Tympanic, resp. rate 18, weight 108 lb 14.4 oz (49.4 kg). GENERAL:  Thin elderly woman sitting comfortably in the exam room in no acute distress. MENTAL STATUS:  Alert and oriented to person, place and time. HEAD:  Curly gray hair.  Normocephalic, atraumatic, face symmetric, no Cushingoid features. EYES:  Glasses.  Blue eyes.  Pupils equal round and reactive to light and accomodation.  No conjunctivitis or scleral icterus. ENT:  Oropharynx clear without lesion.  Tongue normal. Mucous membranes moist.  RESPIRATORY:  Clear to auscultation without rales, wheezes or rhonchi. CARDIOVASCULAR:  Regular rate and rhythm without murmur, rub or gallop. ABDOMEN:  Soft, non-tender, with active bowel sounds, and no hepatosplenomegaly.  No masses. SKIN:  No rashes, ulcers or lesions. EXTREMITIES: No edema, no skin discoloration or tenderness.  No palpable cords. LYMPH NODES: No palpable cervical, supraclavicular, axillary or inguinal adenopathy  NEUROLOGICAL: Unremarkable. PSYCH:  Appropriate.   Imaging studies: 09/02/2013:  Chest, abdomen, and pelvic CT revealed no significant interval change. She has some mildly enlarged intra-aortic caval retroperitoneal nodes measuring 1.6 x 1.2 cm. There were stable subcentimeter retroperitoneal nodes. 09/07/2014:  Chest, abdomen, and pelvic CT revealed no evidence of lymphoma recurrence.   09/26/2015:  Chest, abdomen, and pelvic CT revealed stable portacaval (2.5 cm) and aortocaval (1.0 cm) lymph nodes and mild hepatomegaly.   10/29/2017:   Chest, abdomen, and pelvic CT revealed an enlarged mass in the porta hepatis at site of prior presumed lymph node. Differential would include isolated recurrent lymphadenopathy from non-Hodgkin's lymphoma versus gastrointestinal stromal tumor. Lesion was 5.8 x 4.9 cm (compared to 2.5 x 3.3 cm on 09/26/2015).  This lesion has been present but smaller at this site since 2015.  It was not present on PET-CT scan of 2009.  There was no additional metastatic disease evident in the chest, abdomen or pelvis.   No visits with results within 3 Day(s) from this visit.  Latest known visit with results is:  Appointment on 01/07/2018  Component Date Value Ref Range Status  . Uric Acid, Serum 01/07/2018 2.9  2.3 - 6.6 mg/dL Final   Performed at [facility], [address] [place], [place] [address]  . WBC 01/07/2018 5.9  3.6 - 11.0 K/uL Final  . RBC 01/07/2018 3.87  3.80 - 5.20  MIL/uL Final  . Hemoglobin 01/07/2018 12.6  12.0 - 16.0 g/dL Final  . HCT 01/07/2018 37.2  35.0 - 47.0 % Final  . MCV 01/07/2018 96.1  80.0 - 100.0 fL Final  . MCH 01/07/2018 32.5  26.0 - 34.0 pg Final  . MCHC 01/07/2018 33.9  32.0 - 36.0 g/dL Final  . RDW 01/07/2018 13.0  11.5 - 14.5 % Final  . Platelets 01/07/2018 293  150 - 440 K/uL Final  . Neutrophils Relative % 01/07/2018 74  % Final  . Neutro Abs 01/07/2018 4.4  1.4 - 6.5 K/uL Final  . Lymphocytes Relative 01/07/2018 11  % Final  . Lymphs Abs 01/07/2018 0.6* 1.0 - 3.6 K/uL Final  . Monocytes Relative 01/07/2018 11  % Final  . Monocytes Absolute 01/07/2018 0.6  0.2 - 0.9 K/uL Final  . Eosinophils Relative 01/07/2018 3  % Final  . Eosinophils Absolute 01/07/2018 0.2  0 - 0.7 K/uL Final  . Basophils Relative 01/07/2018 1  % Final  . Basophils Absolute 01/07/2018 0.0  0 - 0.1 K/uL Final   Performed at [facility], [address], [place], [place] [address]  . Sodium 01/07/2018 133* 135 - 145 mmol/L Final  . Potassium 01/07/2018 4.0  3.5 - 5.1 mmol/L Final   . Chloride 01/07/2018 99* 101 - 111 mmol/L Final  . CO2 01/07/2018 26  22 - 32 mmol/L Final  . Glucose, Bld 01/07/2018 79  65 - 99 mg/dL Final  . BUN 01/07/2018 18  6 - 20 mg/dL Final  . Creatinine, Ser 01/07/2018 0.66  0.44 - 1.00 mg/dL Final  . Calcium 01/07/2018 9.1  8.9 - 10.3 mg/dL Final  . Total Protein 01/07/2018 6.0* 6.5 - 8.1 g/dL Final  . Albumin 01/07/2018 3.9  3.5 - 5.0 g/dL Final  . AST 01/07/2018 23  15 - 41 U/L Final  . ALT 01/07/2018 28  14 - 54 U/L Final  . Alkaline Phosphatase 01/07/2018 98  38 - 126 U/L Final  . Total Bilirubin 01/07/2018 0.6  0.3 - 1.2 mg/dL Final  . GFR calc non Af Amer 01/07/2018 >60  >60 mL/min Final  . GFR calc Af Amer 01/07/2018 >60  >60 mL/min Final   Comment: (NOTE) The eGFR has been calculated using the CKD EPI equation. This calculation has not been validated in all clinical situations. eGFR's persistently <60 mL/min signify possible Chronic Kidney Disease.   Georgiann Hahn gap 01/07/2018 8  5 - 15 Final   Performed at [facility], [address] [place], [place] [address]    Assessment:  [name] is a 84 y.o. female with a history of stage IIIA follicular non-Hodgkin's lymphoma.  She presented in the summer of 2003 with an epigastric and left upper quadrant mass and left arm weakness with decreased mobility. Abdominal ultrasound revealed a large mass in the head of the pancreas with  multiple enlarged lymph nodes and a large lobulated mass in the right retroperitoneal area.  MRI of the left brachial plexus revealed a 2.5 cm soft tissue mass and abnormal lymph node along the left brachial plexus and in the supraclavicular region.  Cervical node biopsy on 08/06/2002 revealed a grade I follicular non-Hodgkin's lymphoma.  Bone marrow on 08/09/2002 was negative.  Lumbar puncture was negative.  She received Leukeran and Rituxan. As her arm pain and mobility was not relieved, she received local radiation to the left brachial plexus area.  Leukeran continued through 02/28/2013 (discontinued secondary side effects).  She developed progressive disease in 10/2003. Patient received CVP chemotherapy beginning 01/02/2004. In 06/2004 she was switched to CNOP.  She received 8 cycles completing on 01/15/2005.  She began maintenance Rituxan on 04/23/2004 (4 weekly doses every 3 months for 2 years). Therapy completed on 09/13/2008.    Excision of a 1 cm right preauricular mass on 10/04/2017 revealed a low grade follicular lymphoma.  CT guided biopsy of the retroperitoneal nodal mass on 11/20/2017 revealed grade I follicular lymphoma.  Immunohistochemistry revealed positive for CD10, BCL-2, BCL-6, Ki67 (15-20%) and negative for cyclin D1 and CD5.  Flow cytometry revealed a CD10+ clonal B-cell population.   Chest, abdomen, and pelvic CT on 10/29/2017 revealed an enlarged mass in the porta hepatis at site of prior presumed lymph node. Differential would include isolated recurrent lymphadenopathy from non-Hodgkin's lymphoma versus gastrointestinal stromal tumor. Lesion was 5.8 x 4.9 cm (compared to 2.5 x 3.3 cm on 09/26/2015).    She is s/p 3 weeks of Rituxan (12/24/2017 - 01/07/2018).  The following studies were negative on 10/23/2017:  hepatitis B core antibody, hepatitis B surface antigen, hepatitis C antibody.  G6PD assay was normal.  She was admitted to [facility] from 08/15/2015 - 08/25/2015 with bloody diarrhea then hemolytic uremic syndrome related to E. coli 0157.  She had a complicated course with acute renal failure and anemia.  She was then in rehab for 3 weeks.  She continues to work on her balance.  Bone density on 10/27/2017 revealed osteoporosis with a T-score of -3.8 in the left forearm radius.  Symptomatically, she is doing well.  She denies any abdominal symptoms. Patient has some anxiety. Her weight remains stable.  Exam reveals no adenopathy or hepatosplenomegaly. Labs not collected prior to clinic.   Plan: 1.  Labs today:  CBC with  diff, BMP, uric acid. 2.  Week #4 Rituxan. 3.  Continue allopurinol.  4.  Discuss plan for reassessment in 3 months and initiation of maintenance Rituxan (every 3 months x 2 years if disease stable/improving). 5.  Discuss osteoporosis treatment options (Reclast or Prolia). Discuss need for dental clearance. Discuss need for calcium 1200 mg and vitamin D 800 IU daily. Will discuss with PCP.   6.  Chest, abdomen, and pelvic CT with contrast on 04/01/2018. 7.  RTC on 04/08/2018 for MD assessment, labs (CBC with diff, CMP, LDH, uric acid), review of scans, and maintenance cycle #1 Rituxan.   Honor Loh, NP  01/14/2018, 8:57 AM   I saw and evaluated the patient, participating in the key portions of the service and reviewing pertinent diagnostic studies and records.  I reviewed the nurse practitioner's note and agree with the findings and the plan.  The assessment and plan were discussed with the patient.  Multiple questions were asked by the patient and answered.   Nolon Stalls, MD 01/14/2018,8:57 AM

## 2018-01-14 NOTE — Progress Notes (Signed)
Pt here for follow up. Per pt overall " doing well" stated gets fatigues after tx but several days does well.

## 2018-01-15 ENCOUNTER — Encounter: Payer: Self-pay | Admitting: Hematology and Oncology

## 2018-01-25 ENCOUNTER — Encounter: Payer: Self-pay | Admitting: Hematology and Oncology

## 2018-01-26 ENCOUNTER — Other Ambulatory Visit: Payer: Self-pay | Admitting: Urgent Care

## 2018-01-26 MED ORDER — ALLOPURINOL 300 MG PO TABS: 300.0000 mg | ORAL_TABLET | Freq: Every day | ORAL | 1 refills | Status: DC

## 2018-03-25 ENCOUNTER — Encounter: Payer: Self-pay | Admitting: Interventional Radiology

## 2018-04-01 ENCOUNTER — Other Ambulatory Visit: Payer: Self-pay | Admitting: Urgent Care

## 2018-04-01 ENCOUNTER — Inpatient Hospital Stay: Payer: Medicare Other | Attending: Hematology and Oncology

## 2018-04-01 ENCOUNTER — Other Ambulatory Visit: Payer: Self-pay

## 2018-04-01 ENCOUNTER — Ambulatory Visit
Admission: RE | Admit: 2018-04-01 | Discharge: 2018-04-01 | Disposition: A | Discharge status: Home / Self Care | Payer: Medicare Other | Source: Ambulatory Visit | Attending: Urgent Care | Admitting: Urgent Care

## 2018-04-01 DIAGNOSIS — M810 Age-related osteoporosis without current pathological fracture: Secondary | ICD-10-CM | POA: Insufficient documentation

## 2018-04-01 DIAGNOSIS — Z5112 Encounter for antineoplastic immunotherapy: Secondary | ICD-10-CM | POA: Insufficient documentation

## 2018-04-01 DIAGNOSIS — R590 Localized enlarged lymph nodes: Secondary | ICD-10-CM | POA: Insufficient documentation

## 2018-04-01 DIAGNOSIS — I700 Atherosclerosis of aorta: Secondary | ICD-10-CM | POA: Insufficient documentation

## 2018-04-01 DIAGNOSIS — R5383 Other fatigue: Secondary | ICD-10-CM | POA: Insufficient documentation

## 2018-04-01 DIAGNOSIS — K5730 Diverticulosis of large intestine without perforation or abscess without bleeding: Secondary | ICD-10-CM | POA: Insufficient documentation

## 2018-04-01 DIAGNOSIS — Z79899 Other long term (current) drug therapy: Secondary | ICD-10-CM | POA: Insufficient documentation

## 2018-04-01 DIAGNOSIS — C8200 Follicular lymphoma grade I, unspecified site: Secondary | ICD-10-CM

## 2018-04-01 DIAGNOSIS — C8203 Follicular lymphoma grade I, intra-abdominal lymph nodes: Secondary | ICD-10-CM | POA: Insufficient documentation

## 2018-04-01 DIAGNOSIS — H8109 Meniere's disease, unspecified ear: Secondary | ICD-10-CM | POA: Insufficient documentation

## 2018-04-01 DIAGNOSIS — I10 Essential (primary) hypertension: Secondary | ICD-10-CM | POA: Insufficient documentation

## 2018-04-01 DIAGNOSIS — G250 Essential tremor: Secondary | ICD-10-CM | POA: Insufficient documentation

## 2018-04-01 DIAGNOSIS — Z7982 Long term (current) use of aspirin: Secondary | ICD-10-CM | POA: Insufficient documentation

## 2018-04-01 DIAGNOSIS — R61 Generalized hyperhidrosis: Secondary | ICD-10-CM | POA: Insufficient documentation

## 2018-04-01 DIAGNOSIS — Z8673 Personal history of transient ischemic attack (TIA), and cerebral infarction without residual deficits: Secondary | ICD-10-CM | POA: Insufficient documentation

## 2018-04-01 DIAGNOSIS — K219 Gastro-esophageal reflux disease without esophagitis: Secondary | ICD-10-CM | POA: Insufficient documentation

## 2018-04-01 LAB — BASIC METABOLIC PANEL
ANION GAP: 9 (ref 5–15)
BUN: 16 mg/dL (ref 6–20)
CALCIUM: 9.7 mg/dL (ref 8.9–10.3)
CO2: 27 mmol/L (ref 22–32)
CREATININE: 0.53 mg/dL (ref 0.44–1.00)
Chloride: 98 mmol/L — ABNORMAL LOW (ref 101–111)
Glucose, Bld: 92 mg/dL (ref 65–99)
Potassium: 4.2 mmol/L (ref 3.5–5.1)
SODIUM: 134 mmol/L — AB (ref 135–145)

## 2018-04-01 MED ORDER — IOHEXOL 300 MG/ML  SOLN
75.0000 mL | Freq: Once | INTRAMUSCULAR | Status: AC | PRN
  Administered 2018-04-01: 75 mL via INTRAVENOUS

## 2018-04-05 ENCOUNTER — Encounter: Payer: Self-pay | Admitting: Hematology and Oncology

## 2018-04-07 NOTE — Progress Notes (Addendum)
[facility] day:  04/08/18  Chief Complaint: [name] is a 84 y.o. female with a history of stage IIIA follicular non-Hodgkin's lymphoma who is seen for 3 month assessment prior to week #1 maintenance Rituxan.   HPI: The patient was last seen in the medical oncology clinic on 01/14/2018.  At that time, patient complained of fatigue and nonspecific headaches.  She noted that she felt "zapped" for 3 to 4 days following her infusions. Exam revealed no adenopathy or hepatosplenomegaly.  Sodium low at 132.  She received week #4 Rituxan.  Pretreatment diphenhydramine was decreased to 25 mg.  Patient was scheduled for repeat CT imaging on 04/01/2018.  CT of the chest, abdomen, and pelvis with contrast on 04/01/2018 revealed roughly stable size and appearance of portacaval lymphadenopathy, which was compatible with residual disease.  Nodal mass measured 5.8 x 4.8 cm (previously 5.9 x 5.0 cm).  Note made of mild colonic diverticulosis without evidence of acute diverticulitis, numerous small densely calcified areas in the uterus that likely represent tiny calcified fibroids, and severe calcifications of the mitral annulus.  In the interim, patient is feeling "ok". She occasionally has night sweats about every 2-3 weeks. Patient states that she becomes "damp", however the sweats are not drenching. Patient notes that her energy has improved overall while off of the infusions. She advises that she does become tired in the afternoons, however she attributes this to her age.   Patient denies fevers and significant weight loss. She is eating well. Her weight has increased by 2 pounds. Patient has worsening acid reflux. She has been seen by Dr. Pryor Ochoa (ENT). She advises that her "vocal cords are not closing". She was prescribed omeprazole. She expresses concern because Dr. Pryor Ochoa advised her that the prescribed PPI therapy could lead to further bone thinning.    Patient has an essential tremor that she notes is "a little worse". Patient denies pain in the clinic today.    Past Medical History:  Diagnosis Date  . Cancer [facility]) 2013   Non-Hodgkin Lymphoma with chemo and rad tx  . Follicular lymphoma grade I ([place]) 08/06/2002  . 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 Hypertension   . Meniere's disease    with bilateral hearing loss  . Mini stroke [facility])    summer 2014  . Osteoporosis     Past Surgical History:  Procedure Laterality Date  . BONE MARROW BIOPSY  08/09/2002  . BREAST LUMPECTOMY  1950s   benign  . CATARACT EXTRACTION  03/2009  . LYMPH NODE BIOPSY  08/07/2002   cervical  . TUBAL LIGATION      Family History  Problem Relation Age of Onset  . Cancer Father        Throat  . Cancer Sister 68       Colon    Social History:  reports that she has never smoked. She has never used smokeless tobacco. She reports that she does not drink alcohol or use drugs.  She lives in [place].  The patient is accompanied by her husband today.  Allergies:  Allergies  Allergen Reactions  . Lisinopril Cough    Current Medications: Current Outpatient Medications  Medication Sig Dispense Refill  . Acetaminophen (TYLENOL ARTHRITIS EXT RELIEF PO) Take 1 tablet by mouth daily as needed (pain).    [name] allopurinol (ZYLOPRIM) 300 MG tablet Take 1 tablet (300 mg total) by mouth daily. 90 tablet 1  .  aspirin 81 MG chewable tablet Chew 81 mg by mouth daily.     . AZELASTINE & FLUTICASONE NA 1 spray each nase twice daily.    . calcium-vitamin D (CALCIUM 500/D) 500-200 MG-UNIT tablet Take 1 tablet by mouth 2 (two) times daily.     . Flax Oil-Fish Oil-Borage Oil (FISH OIL-FLAX OIL-BORAGE OIL) CAPS Take 1,000-1,400 mg by mouth daily.    . Multiple Vitamin (MULTI-VITAMINS) TABS Take 1 tablet by mouth daily.     . Multiple Vitamins-Minerals (PRESERVISION AREDS 2 PO) Take 1 tablet by mouth daily.     [name] omeprazole (PRILOSEC) 40 MG  capsule Take 40 mg by mouth daily.    . Probiotic Product (PROBIOTIC DAILY PO) Take 2 tablets by mouth daily.     . traZODone (DESYREL) 50 MG tablet TAKE 1 TO 2 TABLETS BY MOUTH DAILY AT BEDTIME AS NEEDED    . triamcinolone cream (KENALOG) 0.1 % Apply 1 application topically 2 (two) times daily.     . Turmeric Curcumin 500 MG CAPS Take 1 capsule by mouth daily.     . famotidine (PEPCID) 20 MG tablet Take 20 mg by mouth daily.      No current facility-administered medications for this visit.    Facility-Administered Medications Ordered in Other Visits  Medication Dose Route Frequency Provider Last Rate Last Dose  . heparin lock flush 100 unit/mL  500 Units Intravenous Once Corcoran, Melissa C, MD      . sodium chloride flush (NS) 0.9 % injection 10 mL  10 mL Intravenous PRN Lequita Asal, MD        Review of Systems:  GENERAL:  Feels "like I am doing fine".  Fatigue in the afternoon.  No fevers.  Intermittent non-drenching sweats (see HPI).  Weight up 2 pounds. PERFORMANCE STATUS (ECOG):  1 HEENT:  No visual changes, runny nose, sore throat, mouth sores or tenderness. Lungs: No shortness of breath or cough.  No hemoptysis. Cardiac:  No chest pain, palpitations, orthopnea, or PND. GI:  Reflux.  No nausea, vomiting, diarrhea, constipation, melena or hematochezia. GU:  No urgency, frequency, dysuria, or hematuria. Musculoskeletal:  No back pain.  No joint pain.  No muscle tenderness. Extremities:  No pain or swelling. Skin:  No rashes or skin changes. Neuro:  Essential tremor.  Balance issues.  No headache, numbness or weakness, balance or coordination issues. Endocrine:  No diabetes.  Thyroid follow-up planned with Dr. Gabriel Carina.  No hot flashes or night sweats. Psych:  No mood changes, depression or anxiety. Pain:  No focal pain. Review of systems:  All other systems reviewed and found to be negative.   Physical Exam: Blood pressure (!) 144/72, pulse 67, temperature (!) 97 F (36.1  C), temperature source Tympanic, resp. rate 18, weight 110 lb 12.8 oz (50.3 kg). GENERAL:  Thin elderly woman sitting comfortably in the exam room in no acute distress. MENTAL STATUS:  Alert and oriented to person, place and time. HEAD:  Curly gray hair.  Normocephalic, atraumatic, face symmetric, no Cushingoid features. EYES:  Blue eyes.  Pupils equal round and reactive to light and accomodation.  No conjunctivitis or scleral icterus. ENT:  Oropharynx clear without lesion.  Tongue normal. Mucous membranes moist.  RESPIRATORY:  Clear to auscultation without rales, wheezes or rhonchi. CARDIOVASCULAR:  Regular rate and rhythm without murmur, rub or gallop. ABDOMEN:  Soft, non-tender, with active bowel sounds, and no hepatosplenomegaly.  No masses. SKIN:  No rashes, ulcers or lesions. EXTREMITIES: No edema,  no skin discoloration or tenderness.  No palpable cords. LYMPH NODES: No palpable cervical, supraclavicular, axillary or inguinal adenopathy  NEUROLOGICAL: Unremarkable. PSYCH:  Appropriate.    Imaging studies: 09/02/2013:  Chest, abdomen, and pelvic CT revealed no significant interval change. She has some mildly enlarged intra-aortic caval retroperitoneal nodes measuring 1.6 x 1.2 cm. There were stable subcentimeter retroperitoneal nodes. 09/07/2014:  Chest, abdomen, and pelvic CT revealed no evidence of lymphoma recurrence.   09/26/2015:  Chest, abdomen, and pelvic CT revealed stable portacaval (2.5 cm) and aortocaval (1.0 cm) lymph nodes and mild hepatomegaly.   10/29/2017:  Chest, abdomen, and pelvic CT revealed an enlarged mass in the porta hepatis at site of prior presumed lymph node. Differential would include isolated recurrent lymphadenopathy from non-Hodgkin's lymphoma versus gastrointestinal stromal tumor. Lesion was 5.8 x 4.9 cm (compared to 2.5 x 3.3 cm on 09/26/2015).  This lesion has been present but smaller at this site since 2015.  It was not present on PET-CT scan of 2009.   There was no additional metastatic disease evident in the chest, abdomen or pelvis. 04/01/2018:  Chest, abdomen, and pelvic CT  revealed roughly stable size and appearance of portacaval lymphadenopathy, which was compatible with residual disease.  Nodal mass measured 5.8 x 4.8 cm (previously 5.9 x 5.0 cm).  Note made of mild colonic diverticulosis without evidence of acute diverticulitis, numerous small densely calcified areas in the uterus that likely represent tiny calcified fibroids, and severe calcifications of the mitral annulus.   Infusion on 04/08/2018  Component Date Value Ref Range Status  . LDH 04/08/2018 129  98 - 192 U/L Final   Performed at [facility], [address] [place], [place] [address]  . Sodium 04/08/2018 131* 135 - 145 mmol/L Final  . Potassium 04/08/2018 3.9  3.5 - 5.1 mmol/L Final  . Chloride 04/08/2018 98* 101 - 111 mmol/L Final  . CO2 04/08/2018 24  22 - 32 mmol/L Final  . Glucose, Bld 04/08/2018 102* 65 - 99 mg/dL Final  . BUN 04/08/2018 18  6 - 20 mg/dL Final  . Creatinine, Ser 04/08/2018 0.55  0.44 - 1.00 mg/dL Final  . Calcium 04/08/2018 8.8* 8.9 - 10.3 mg/dL Final  . Total Protein 04/08/2018 5.9* 6.5 - 8.1 g/dL Final  . Albumin 04/08/2018 3.9  3.5 - 5.0 g/dL Final  . AST 04/08/2018 30  15 - 41 U/L Final  . ALT 04/08/2018 27  14 - 54 U/L Final  . Alkaline Phosphatase 04/08/2018 89  38 - 126 U/L Final  . Total Bilirubin 04/08/2018 0.5  0.3 - 1.2 mg/dL Final  . GFR calc non Af Amer 04/08/2018 >60  >60 mL/min Final  . GFR calc Af Amer 04/08/2018 >60  >60 mL/min Final   Comment: (NOTE) The eGFR has been calculated using the CKD EPI equation. This calculation has not been validated in all clinical situations. eGFR's persistently <60 mL/min signify possible Chronic Kidney Disease.   Georgiann Hahn gap 04/08/2018 9  5 - 15 Final   Performed at [facility], [address] [place], [place] [address]  . WBC 04/08/2018 6.4  3.6 - 11.0 K/uL Final  . RBC  04/08/2018 3.61* 3.80 - 5.20 MIL/uL Final  . Hemoglobin 04/08/2018 12.4  12.0 - 16.0 g/dL Final  . HCT 04/08/2018 36.0  35.0 - 47.0 % Final  . MCV 04/08/2018 99.8  80.0 - 100.0 fL Final  . MCH 04/08/2018 34.4* 26.0 - 34.0 pg Final  . MCHC 04/08/2018  34.5  32.0 - 36.0 g/dL Final  . RDW 04/08/2018 14.3  11.5 - 14.5 % Final  . Platelets 04/08/2018 226  150 - 440 K/uL Final  . Neutrophils Relative % 04/08/2018 78  % Final  . Neutro Abs 04/08/2018 5.0  1.4 - 6.5 K/uL Final  . Lymphocytes Relative 04/08/2018 8  % Final  . Lymphs Abs 04/08/2018 0.5* 1.0 - 3.6 K/uL Final  . Monocytes Relative 04/08/2018 11  % Final  . Monocytes Absolute 04/08/2018 0.7  0.2 - 0.9 K/uL Final  . Eosinophils Relative 04/08/2018 2  % Final  . Eosinophils Absolute 04/08/2018 0.1  0 - 0.7 K/uL Final  . Basophils Relative 04/08/2018 1  % Final  . Basophils Absolute 04/08/2018 0.0  0 - 0.1 K/uL Final   Performed at [facility], [address] [place], [place] [address]    Assessment:  AKAISHA TRUMAN is a 84 y.o. female with a history of stage IIIA follicular non-Hodgkin's lymphoma.  She presented in the summer of 2003 with an epigastric and left upper quadrant mass and left arm weakness with decreased mobility. Abdominal ultrasound revealed a large mass in the head of the pancreas with  multiple enlarged lymph nodes and a large lobulated mass in the right retroperitoneal area.  MRI of the left brachial plexus revealed a 2.5 cm soft tissue mass and abnormal lymph node along the left brachial plexus and in the supraclavicular region.  Cervical node biopsy on 08/06/2002 revealed a grade I follicular non-Hodgkin's lymphoma.  Bone marrow on 08/09/2002 was negative.  Lumbar puncture was negative.  She received Leukeran and Rituxan. As her arm pain and mobility was not relieved, she received local radiation to the left brachial plexus area. Leukeran continued through 02/28/2013 (discontinued secondary side  effects).  She developed progressive disease in 10/2003. Patient received CVP chemotherapy beginning 01/02/2004. In 06/2004 she was switched to CNOP.  She received 8 cycles completing on 01/15/2005.  She began maintenance Rituxan on 04/23/2004 (4 weekly doses every 3 months for 2 years). Therapy completed on 09/13/2008.    Excision of a 1 cm right preauricular mass on 10/04/2017 revealed a low grade follicular lymphoma.  CT guided biopsy of the retroperitoneal nodal mass on 11/20/2017 revealed grade I follicular lymphoma.  Immunohistochemistry revealed positive for CD10, BCL-2, BCL-6, Ki67 (15-20%) and negative for cyclin D1 and CD5.  Flow cytometry revealed a CD10+ clonal B-cell population.   Chest, abdomen, and pelvic CT on 10/29/2017 revealed an enlarged mass in the porta hepatis at site of prior presumed lymph node. Differential would include isolated recurrent lymphadenopathy from non-Hodgkin's lymphoma versus gastrointestinal stromal tumor. Lesion was 5.8 x 4.9 cm (compared to 2.5 x 3.3 cm on 09/26/2015).   Chest, abdomen, and pelvic CT on 04/01/2018 revealed roughly stable size and appearance of portacaval lymphadenopathy, which was compatible with residual disease.  Nodal mass measured 5.8 x 4.8 cm (previously 5.9 x 5.0 cm on 10/29/2017).  She is s/p weekly cycles of Rituxan (12/24/2017 - 01/14/2018).  The following studies were negative on 10/23/2017:  hepatitis B core antibody, hepatitis B surface antigen, hepatitis C antibody.  G6PD assay was normal.  She was admitted to [facility] from 08/15/2015 - 08/25/2015 with bloody diarrhea then hemolytic uremic syndrome related to E. coli 0157.  She had a complicated course with acute renal failure and anemia.  She was then in rehab for 3 weeks.  She continues to work on her balance.  Bone density on 10/27/2017 revealed  osteoporosis with a T-score of -3.8 in the left forearm radius.  Symptomatically, she has fatigue in the afternoon. She experiences  intermittent dampening night sweats.  She denies any abdominal symptoms. She denies any recent infections. Her weight is up 2 pounds.  Exam reveals no adenopathy or hepatosplenomegaly. She has an essential tremor that is "a little worse". Sodium low at 131.     Plan: 1.  Labs today:  CBC with diff, CMP, LDH, uric acid 2.  Discuss stable interval CT imaging.  Portacaval lymphadenopathy stable. 3.  Review plans maintenance Rituxan (every 3 months x 2 years if disease stable/improving).  No plan for chemotherapy unless disease is growing.  4.  Labs reviewed. Blood counts stable and adequate enough for treatment. Will proceed with cycle #1 maintenance Rituxan today as planned.  5.  Continue allopurinol as previously prescribed. 6.  Discuss osteoporosis treatment options (Reclast or Prolia). Patient has decided to proceed with Prolia. She has been cleared from a dental perspective. Calcium is a little low at 8.8 today. Ensure patient taking adequate calcium.  Continue calcium 1200 mg and vitamin D 800 IU daily.  Will schedule for Prolia injections.  7.  Discuss thyroid function. Patient is scheduled to see Dr. Gabriel Carina in consult.     8.  Patient inquired about shingles vaccine. Vaccine ok.  Discuss no live virus vaccines. 9.  RTC on 07/08/2018 for MD assessment, labs (CBC with diff, CMP, LDH, uric acid), and maintenance cycle #2 Rituxan.   Honor Loh, NP  04/08/2018, 9:33 AM   I saw and evaluated the patient, participating in the key portions of the service and reviewing pertinent diagnostic studies and records.  I reviewed the nurse practitioner's note and agree with the findings and the plan.  The assessment and plan were discussed with the patient.  Several questions were asked by the patient and answered.   Nolon Stalls, MD 04/08/2018,9:33 AM

## 2018-04-08 ENCOUNTER — Encounter: Payer: Self-pay | Admitting: Hematology and Oncology

## 2018-04-08 ENCOUNTER — Other Ambulatory Visit: Payer: Self-pay | Admitting: Hematology and Oncology

## 2018-04-08 ENCOUNTER — Inpatient Hospital Stay: Payer: Medicare Other

## 2018-04-08 ENCOUNTER — Other Ambulatory Visit: Payer: Self-pay

## 2018-04-08 ENCOUNTER — Encounter: Payer: Self-pay | Admitting: Urgent Care

## 2018-04-08 ENCOUNTER — Inpatient Hospital Stay (HOSPITAL_BASED_OUTPATIENT_CLINIC_OR_DEPARTMENT_OTHER): Payer: Medicare Other | Admitting: Hematology and Oncology

## 2018-04-08 VITALS — BP 144/72 | HR 67 | Temp 97.0°F | Resp 18 | Wt 110.8 lb

## 2018-04-08 VITALS — BP 146/73 | HR 73 | Temp 97.1°F | Resp 18

## 2018-04-08 DIAGNOSIS — C8203 Follicular lymphoma grade I, intra-abdominal lymph nodes: Secondary | ICD-10-CM

## 2018-04-08 DIAGNOSIS — Z7982 Long term (current) use of aspirin: Secondary | ICD-10-CM

## 2018-04-08 DIAGNOSIS — Z5112 Encounter for antineoplastic immunotherapy: Secondary | ICD-10-CM | POA: Diagnosis present

## 2018-04-08 DIAGNOSIS — R5383 Other fatigue: Secondary | ICD-10-CM | POA: Diagnosis not present

## 2018-04-08 DIAGNOSIS — Z8673 Personal history of transient ischemic attack (TIA), and cerebral infarction without residual deficits: Secondary | ICD-10-CM | POA: Diagnosis not present

## 2018-04-08 DIAGNOSIS — Z79899 Other long term (current) drug therapy: Secondary | ICD-10-CM | POA: Diagnosis not present

## 2018-04-08 DIAGNOSIS — H8109 Meniere's disease, unspecified ear: Secondary | ICD-10-CM

## 2018-04-08 DIAGNOSIS — M810 Age-related osteoporosis without current pathological fracture: Secondary | ICD-10-CM | POA: Diagnosis not present

## 2018-04-08 DIAGNOSIS — C8200 Follicular lymphoma grade I, unspecified site: Secondary | ICD-10-CM

## 2018-04-08 DIAGNOSIS — G250 Essential tremor: Secondary | ICD-10-CM

## 2018-04-08 DIAGNOSIS — R61 Generalized hyperhidrosis: Secondary | ICD-10-CM

## 2018-04-08 DIAGNOSIS — Z7189 Other specified counseling: Secondary | ICD-10-CM

## 2018-04-08 DIAGNOSIS — I10 Essential (primary) hypertension: Secondary | ICD-10-CM

## 2018-04-08 DIAGNOSIS — K219 Gastro-esophageal reflux disease without esophagitis: Secondary | ICD-10-CM | POA: Diagnosis not present

## 2018-04-08 LAB — CBC WITH DIFFERENTIAL/PLATELET
Basophils Absolute: 0 10*3/uL (ref 0–0.1)
Basophils Relative: 1 %
Eosinophils Absolute: 0.1 10*3/uL (ref 0–0.7)
Eosinophils Relative: 2 %
HCT: 36 % (ref 35.0–47.0)
Hemoglobin: 12.4 g/dL (ref 12.0–16.0)
Lymphocytes Relative: 8 %
Lymphs Abs: 0.5 10*3/uL — ABNORMAL LOW (ref 1.0–3.6)
MCH: 34.4 pg — ABNORMAL HIGH (ref 26.0–34.0)
MCHC: 34.5 g/dL (ref 32.0–36.0)
MCV: 99.8 fL (ref 80.0–100.0)
Monocytes Absolute: 0.7 10*3/uL (ref 0.2–0.9)
Monocytes Relative: 11 %
Neutro Abs: 5 10*3/uL (ref 1.4–6.5)
Neutrophils Relative %: 78 %
Platelets: 226 10*3/uL (ref 150–440)
RBC: 3.61 MIL/uL — ABNORMAL LOW (ref 3.80–5.20)
RDW: 14.3 % (ref 11.5–14.5)
WBC: 6.4 10*3/uL (ref 3.6–11.0)

## 2018-04-08 LAB — COMPREHENSIVE METABOLIC PANEL
ALT: 27 U/L (ref 14–54)
AST: 30 U/L (ref 15–41)
Albumin: 3.9 g/dL (ref 3.5–5.0)
Alkaline Phosphatase: 89 U/L (ref 38–126)
Anion gap: 9 (ref 5–15)
BUN: 18 mg/dL (ref 6–20)
CO2: 24 mmol/L (ref 22–32)
Calcium: 8.8 mg/dL — ABNORMAL LOW (ref 8.9–10.3)
Chloride: 98 mmol/L — ABNORMAL LOW (ref 101–111)
Creatinine, Ser: 0.55 mg/dL (ref 0.44–1.00)
GFR calc Af Amer: >60 mL/min (ref >60)
GFR calc non Af Amer: >60 mL/min (ref >60)
Glucose, Bld: 102 mg/dL — ABNORMAL HIGH (ref 65–99)
Potassium: 3.9 mmol/L (ref 3.5–5.1)
Sodium: 131 mmol/L — ABNORMAL LOW (ref 135–145)
Total Bilirubin: 0.5 mg/dL (ref 0.3–1.2)
Total Protein: 5.9 g/dL — ABNORMAL LOW (ref 6.5–8.1)

## 2018-04-08 LAB — URIC ACID: Uric Acid, Serum: 2.7 mg/dL (ref 2.3–6.6)

## 2018-04-08 LAB — LACTATE DEHYDROGENASE: LDH: 129 U/L (ref 98–192)

## 2018-04-08 MED ORDER — ACETAMINOPHEN 325 MG PO TABS
650.0000 mg | ORAL_TABLET | Freq: Once | ORAL | Status: AC
  Administered 2018-04-08: 650 mg via ORAL
  Filled 2018-04-08: qty 2

## 2018-04-08 MED ORDER — DIPHENHYDRAMINE HCL 25 MG PO CAPS
25.0000 mg | ORAL_CAPSULE | Freq: Once | ORAL | Status: AC
  Administered 2018-04-08: 25 mg via ORAL
  Filled 2018-04-08: qty 1

## 2018-04-08 MED ORDER — HEPARIN SOD (PORK) LOCK FLUSH 100 UNIT/ML IV SOLN: 500.0000 [IU] | Freq: Once | INTRAVENOUS | Status: DC

## 2018-04-08 MED ORDER — SODIUM CHLORIDE 0.9 % IV SOLN
375.0000 mg/m2 | Freq: Once | INTRAVENOUS | Status: DC
  Filled 2018-04-08: qty 60

## 2018-04-08 MED ORDER — SODIUM CHLORIDE 0.9% FLUSH
10.0000 mL | INTRAVENOUS | Status: DC | PRN
  Filled 2018-04-08: qty 10

## 2018-04-08 MED ORDER — SODIUM CHLORIDE 0.9 % IV SOLN
375.0000 mg/m2 | Freq: Once | INTRAVENOUS | Status: AC
  Administered 2018-04-08: 600 mg via INTRAVENOUS
  Filled 2018-04-08: qty 50

## 2018-04-08 MED ORDER — SODIUM CHLORIDE 0.9 % IV SOLN
Freq: Once | INTRAVENOUS | Status: AC
  Administered 2018-04-08: 10:00:00 via INTRAVENOUS
  Filled 2018-04-08: qty 1000

## 2018-04-08 NOTE — Progress Notes (Signed)
DISCONTINUE ON PATHWAY REGIMEN - Lymphoma and CLL     Administer weekly:     Rituximab   **Always confirm dose/schedule in your pharmacy ordering system**    REASON: Continuation Of Treatment PRIOR TREATMENT: LYOS201: Rituximab 375 mg/m2 IV Weekly x 4 Weeks TREATMENT RESPONSE: Stable Disease (SD)  START ON PATHWAY REGIMEN - Lymphoma and CLL     Administer weekly:     Rituximab   **Always confirm dose/schedule in your pharmacy ordering system**    Patient Characteristics: Follicular Lymphoma, Grades 1, 2, and 3A, First Line, Stage III / IV, Symptomatic or Bulky Disease Disease Type: Follicular Lymphoma Disease Type: Not Applicable Disease Type: Not Applicable [place] Stage: III Tumor Grade: 3A Line of therapy: First Line Disease Characteristics: Symptomatic or Bulky Disease Intent of Therapy: Non-Curative / Palliative Intent, Discussed with Patient

## 2018-04-08 NOTE — Progress Notes (Signed)
Patient here for follow up. Now taking omeprazole 40 mg daily for reflux per Dr. Pryor Ochoa @ [facility].. New Primary care provider as of 03/08/18 : Sylvia Pile Mclaughlin PA @ [facility]([phone])

## 2018-04-12 ENCOUNTER — Other Ambulatory Visit: Payer: Self-pay | Admitting: Urgent Care

## 2018-04-12 DIAGNOSIS — Z79899 Other long term (current) drug therapy: Secondary | ICD-10-CM

## 2018-04-12 DIAGNOSIS — M810 Age-related osteoporosis without current pathological fracture: Secondary | ICD-10-CM

## 2018-04-20 ENCOUNTER — Inpatient Hospital Stay: Payer: Medicare Other

## 2018-04-20 ENCOUNTER — Inpatient Hospital Stay: Payer: Medicare Other | Attending: Hematology and Oncology

## 2018-04-20 DIAGNOSIS — M810 Age-related osteoporosis without current pathological fracture: Secondary | ICD-10-CM

## 2018-04-20 DIAGNOSIS — Z79899 Other long term (current) drug therapy: Secondary | ICD-10-CM

## 2018-04-20 DIAGNOSIS — C8203 Follicular lymphoma grade I, intra-abdominal lymph nodes: Secondary | ICD-10-CM | POA: Insufficient documentation

## 2018-04-20 LAB — BASIC METABOLIC PANEL
ANION GAP: 10 (ref 5–15)
BUN: 23 mg/dL — AB (ref 6–20)
CALCIUM: 8.9 mg/dL (ref 8.9–10.3)
CO2: 24 mmol/L (ref 22–32)
Chloride: 98 mmol/L — ABNORMAL LOW (ref 101–111)
Creatinine, Ser: 0.79 mg/dL (ref 0.44–1.00)
GFR calc Af Amer: >60 mL/min (ref >60)
GLUCOSE: 109 mg/dL — AB (ref 65–99)
POTASSIUM: 4.1 mmol/L (ref 3.5–5.1)
Sodium: 132 mmol/L — ABNORMAL LOW (ref 135–145)

## 2018-04-20 MED ORDER — DENOSUMAB 60 MG/ML ~~LOC~~ SOSY
60.0000 mg | PREFILLED_SYRINGE | Freq: Once | SUBCUTANEOUS | Status: AC
  Administered 2018-04-20: 60 mg via SUBCUTANEOUS
  Filled 2018-04-20: qty 1

## 2018-05-28 ENCOUNTER — Other Ambulatory Visit: Payer: Self-pay | Admitting: Urgent Care

## 2018-06-03 MED ORDER — ACETAMINOPHEN 500 MG PO TABS
ORAL_TABLET | ORAL | Status: AC
  Filled 2018-06-03: qty 2

## 2018-07-08 ENCOUNTER — Inpatient Hospital Stay: Payer: Medicare Other | Attending: Hematology and Oncology

## 2018-07-08 ENCOUNTER — Inpatient Hospital Stay (HOSPITAL_BASED_OUTPATIENT_CLINIC_OR_DEPARTMENT_OTHER): Payer: Medicare Other | Admitting: Hematology and Oncology

## 2018-07-08 ENCOUNTER — Inpatient Hospital Stay: Payer: Medicare Other

## 2018-07-08 VITALS — BP 162/75 | HR 63 | Temp 95.5°F | Resp 18 | Wt 109.0 lb

## 2018-07-08 DIAGNOSIS — M810 Age-related osteoporosis without current pathological fracture: Secondary | ICD-10-CM | POA: Diagnosis not present

## 2018-07-08 DIAGNOSIS — Z5112 Encounter for antineoplastic immunotherapy: Secondary | ICD-10-CM

## 2018-07-08 DIAGNOSIS — C8288 Other types of follicular lymphoma, lymph nodes of multiple sites: Secondary | ICD-10-CM

## 2018-07-08 DIAGNOSIS — C8203 Follicular lymphoma grade I, intra-abdominal lymph nodes: Secondary | ICD-10-CM | POA: Insufficient documentation

## 2018-07-08 DIAGNOSIS — C8200 Follicular lymphoma grade I, unspecified site: Secondary | ICD-10-CM

## 2018-07-08 LAB — LACTATE DEHYDROGENASE: LDH: 142 U/L (ref 98–192)

## 2018-07-08 LAB — COMPREHENSIVE METABOLIC PANEL
ALT: 23 U/L (ref 0–44)
AST: 26 U/L (ref 15–41)
Albumin: 4.1 g/dL (ref 3.5–5.0)
Alkaline Phosphatase: 68 U/L (ref 38–126)
Anion gap: 10 (ref 5–15)
BUN: 25 mg/dL — ABNORMAL HIGH (ref 8–23)
CO2: 25 mmol/L (ref 22–32)
Calcium: 9 mg/dL (ref 8.9–10.3)
Chloride: 103 mmol/L (ref 98–111)
Creatinine, Ser: 0.66 mg/dL (ref 0.44–1.00)
GFR calc Af Amer: >60 mL/min (ref >60)
GFR calc non Af Amer: >60 mL/min (ref >60)
Glucose, Bld: 96 mg/dL (ref 70–99)
Potassium: 3.9 mmol/L (ref 3.5–5.1)
Sodium: 138 mmol/L (ref 135–145)
Total Bilirubin: 0.9 mg/dL (ref 0.3–1.2)
Total Protein: 6.2 g/dL — ABNORMAL LOW (ref 6.5–8.1)

## 2018-07-08 LAB — CBC WITH DIFFERENTIAL/PLATELET
Basophils Absolute: 0.1 10*3/uL (ref 0–0.1)
Basophils Relative: 1 %
Eosinophils Absolute: 0.1 10*3/uL (ref 0–0.7)
Eosinophils Relative: 2 %
HCT: 40.4 % (ref 35.0–47.0)
Hemoglobin: 13.7 g/dL (ref 12.0–16.0)
Lymphocytes Relative: 7 %
Lymphs Abs: 0.4 10*3/uL — ABNORMAL LOW (ref 1.0–3.6)
MCH: 33.3 pg (ref 26.0–34.0)
MCHC: 33.9 g/dL (ref 32.0–36.0)
MCV: 98.2 fL (ref 80.0–100.0)
Monocytes Absolute: 0.7 10*3/uL (ref 0.2–0.9)
Monocytes Relative: 11 %
Neutro Abs: 4.6 10*3/uL (ref 1.4–6.5)
Neutrophils Relative %: 79 %
Platelets: 256 10*3/uL (ref 150–440)
RBC: 4.11 MIL/uL (ref 3.80–5.20)
RDW: 14.1 % (ref 11.5–14.5)
WBC: 5.9 10*3/uL (ref 3.6–11.0)

## 2018-07-08 LAB — URIC ACID: Uric Acid, Serum: 2.9 mg/dL (ref 2.5–7.1)

## 2018-07-08 MED ORDER — SODIUM CHLORIDE 0.9 % IV SOLN
Freq: Once | INTRAVENOUS | Status: AC
  Administered 2018-07-08: 10:00:00 via INTRAVENOUS
  Filled 2018-07-08: qty 1000

## 2018-07-08 MED ORDER — ACETAMINOPHEN 325 MG PO TABS
650.0000 mg | ORAL_TABLET | Freq: Once | ORAL | Status: AC
  Administered 2018-07-08: 650 mg via ORAL
  Filled 2018-07-08: qty 2

## 2018-07-08 MED ORDER — SODIUM CHLORIDE 0.9 % IV SOLN: 375.0000 mg/m2 | Freq: Once | INTRAVENOUS | Status: DC

## 2018-07-08 MED ORDER — DIPHENHYDRAMINE HCL 25 MG PO CAPS
25.0000 mg | ORAL_CAPSULE | Freq: Once | ORAL | Status: AC
  Administered 2018-07-08: 25 mg via ORAL
  Filled 2018-07-08: qty 1

## 2018-07-08 MED ORDER — SODIUM CHLORIDE 0.9 % IV SOLN
600.0000 mg | Freq: Once | INTRAVENOUS | Status: AC
  Administered 2018-07-08: 600 mg via INTRAVENOUS
  Filled 2018-07-08: qty 50

## 2018-07-08 NOTE — Progress Notes (Signed)
[facility] day:  07/08/18  Chief Complaint: [name] is a 84 y.o. female with a history of stage IIIA follicular non-Hodgkin's lymphoma who is seen for 3 month assessment prior to cycle  #2 maintenance Rituxan.   HPI: The patient was last seen in the medical oncology clinic on 04/08/2018.  At that time, she described fatigue in the afternoon. She experienced intermittent dampening night sweats.  She denied any abdominal symptoms. She denied any recent infections. Her weight was up 2 pounds.  Exam revealed no adenopathy or hepatosplenomegaly. She had an essential tremor that was "a little worse". Sodium was low at 131.    Interval scans were stable.  She began maintenance Rituxan.  We discussed Prolia for osteoporosis.  She was cleared by dentistry, but her calcium was low.  She received Prolia on 04/20/2018.  During the interim, patient has been doing well. She notes that she tolerated her last treatment well with no significant side effects. Patient feels generally well. She denies nausea, vomiting, and changes to her bowel habits. She has not appreciated any areas of palpable adenopathy.   Patient denies that she has experienced any B symptoms. She denies any interval infections. Patient advises that she maintains an adequate appetite. She is eating well. Weight today is 109 lb (49.4 kg), which compared to her last visit to the clinic, represents a  1 pound decrease.   Patient denies pain in the clinic today.   Past Medical History:  Diagnosis Date  . Cancer [facility]) 2013   Non-Hodgkin Lymphoma with chemo and rad tx  . Follicular lymphoma grade I ([place]) 08/06/2002  . 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 Hypertension   . Meniere's disease    with bilateral hearing loss  . Mini stroke [facility])    summer 2014  . Osteoporosis     Past Surgical History:  Procedure Laterality Date  . BONE MARROW BIOPSY   08/09/2002  . BREAST LUMPECTOMY  1950s   benign  . CATARACT EXTRACTION  03/2009  . LYMPH NODE BIOPSY  08/07/2002   cervical  . TUBAL LIGATION      Family History  Problem Relation Age of Onset  . Cancer Father        Throat  . Cancer Sister 45       Colon    Social History:  reports that she has never smoked. She has never used smokeless tobacco. She reports that she does not drink alcohol or use drugs.  She lives in [place].  The patient is accompanied by her husband today.  Allergies:  Allergies  Allergen Reactions  . Lisinopril Cough    Current Medications: Current Outpatient Medications  Medication Sig Dispense Refill  . Acetaminophen (TYLENOL ARTHRITIS EXT RELIEF PO) Take 1 tablet by mouth daily as needed (pain).    [name] allopurinol (ZYLOPRIM) 300 MG tablet TAKE ONE TABLET BY MOUTH ONE TIME DAILY  90 tablet 0  . aspirin 81 MG chewable tablet Chew 81 mg by mouth daily.     . AZELASTINE & FLUTICASONE NA 1 spray each nase twice daily.    . calcium-vitamin D (CALCIUM 500/D) 500-200 MG-UNIT tablet Take 1 tablet by mouth 2 (two) times daily.     . famotidine (PEPCID) 20 MG tablet Take 20 mg by mouth daily.     . Flax Oil-Fish Oil-Borage Oil (FISH OIL-FLAX OIL-BORAGE OIL) CAPS Take 1,000-1,400 mg by mouth daily.    [name]  Multiple Vitamin (MULTI-VITAMINS) TABS Take 1 tablet by mouth daily.     . Multiple Vitamins-Minerals (PRESERVISION AREDS 2 PO) Take 1 tablet by mouth daily.     [name] omeprazole (PRILOSEC) 40 MG capsule Take 40 mg by mouth daily.    . Probiotic Product (PROBIOTIC DAILY PO) Take 2 tablets by mouth daily.     . traZODone (DESYREL) 50 MG tablet TAKE 1 TO 2 TABLETS BY MOUTH DAILY AT BEDTIME AS NEEDED    . triamcinolone cream (KENALOG) 0.1 % Apply 1 application topically 2 (two) times daily.     . Turmeric Curcumin 500 MG CAPS Take 1 capsule by mouth daily.      No current facility-administered medications for this visit.     Review of Systems:  GENERAL:   Feels"fine".  No fevers, sweats.  Weight down 1 pound. PERFORMANCE STATUS (ECOG):  1 HEENT:  No visual changes, runny nose, sore throat, mouth sores or tenderness. Lungs: No shortness of breath or cough.  No hemoptysis. Cardiac:  No chest pain, palpitations, orthopnea, or PND. GI:  Reflux.  No nausea, vomiting, diarrhea, constipation, melena or hematochezia. GU:  No urgency, frequency, dysuria, or hematuria. Musculoskeletal:  No back pain.  Right leg tender.  No muscle tenderness. Extremities:  No pain or swelling. Skin:  No rashes or skin changes. Neuro:  Essential tremor.  No headache, numbness or weakness, balance or coordination issues. Endocrine:  No diabetes, thyroid issues, hot flashes or night sweats. Psych:  No mood changes, depression or anxiety. Pain:  No focal pain. Review of systems:  All other systems reviewed and found to be negative.   Physical Exam: Blood pressure (!) 162/75, pulse 63, temperature (!) 95.5 F (35.3 C), temperature source Tympanic, resp. rate 18, weight 109 lb (49.4 kg). GENERAL:  Thin elderly woman sitting comfortably in the exam room in no acute distress. MENTAL STATUS:  Alert and oriented to person, place and time. HEAD:  Curly gray hair.  Normocephalic, atraumatic, face symmetric, no Cushingoid features. EYES:  Glasses.  Blue eyes.  Pupils equal round and reactive to light and accomodation.  No conjunctivitis or scleral icterus. ENT:  Oropharynx clear without lesion.  Tongue normal. Mucous membranes moist.  RESPIRATORY:  Clear to auscultation without rales, wheezes or rhonchi. CARDIOVASCULAR:  Regular rate and rhythm without murmur, rub or gallop. ABDOMEN:  Soft, non-tender, with active bowel sounds, and no hepatosplenomegaly.  No masses. SKIN:  No rashes, ulcers or lesions. EXTREMITIES: No edema, no skin discoloration or tenderness.  No palpable cords. LYMPH NODES: No palpable cervical, supraclavicular, axillary or inguinal adenopathy  NEUROLOGICAL:  Unremarkable. PSYCH:  Appropriate.    Imaging studies: 09/02/2013:  Chest, abdomen, and pelvic CT revealed no significant interval change. She has some mildly enlarged intra-aortic caval retroperitoneal nodes measuring 1.6 x 1.2 cm. There were stable subcentimeter retroperitoneal nodes. 09/07/2014:  Chest, abdomen, and pelvic CT revealed no evidence of lymphoma recurrence.   09/26/2015:  Chest, abdomen, and pelvic CT revealed stable portacaval (2.5 cm) and aortocaval (1.0 cm) lymph nodes and mild hepatomegaly.   10/29/2017:  Chest, abdomen, and pelvic CT revealed an enlarged mass in the porta hepatis at site of prior presumed lymph node. Differential would include isolated recurrent lymphadenopathy from non-Hodgkin's lymphoma versus gastrointestinal stromal tumor. Lesion was 5.8 x 4.9 cm (compared to 2.5 x 3.3 cm on 09/26/2015).  This lesion has been present but smaller at this site since 2015.  It was not present on PET-CT scan of 2009.  There was no additional metastatic disease evident in the chest, abdomen or pelvis. 04/01/2018:  Chest, abdomen, and pelvic CT  revealed roughly stable size and appearance of portacaval lymphadenopathy, which was compatible with residual disease.  Nodal mass measured 5.8 x 4.8 cm (previously 5.9 x 5.0 cm).  Note made of mild colonic diverticulosis without evidence of acute diverticulitis, numerous small densely calcified areas in the uterus that likely represent tiny calcified fibroids, and severe calcifications of the mitral annulus.   Appointment on 07/08/2018  Component Date Value Ref Range Status  . LDH 07/08/2018 142  98 - 192 U/L Final   Performed at [facility], [address] [place], [place] [address]  . Sodium 07/08/2018 138  135 - 145 mmol/L Final  . Potassium 07/08/2018 3.9  3.5 - 5.1 mmol/L Final  . Chloride 07/08/2018 103  98 - 111 mmol/L Final  . CO2 07/08/2018 25  22 - 32 mmol/L Final  . Glucose, Bld 07/08/2018 96  70 - 99 mg/dL Final   . BUN 07/08/2018 25* 8 - 23 mg/dL Final  . Creatinine, Ser 07/08/2018 0.66  0.44 - 1.00 mg/dL Final  . Calcium 07/08/2018 9.0  8.9 - 10.3 mg/dL Final  . Total Protein 07/08/2018 6.2* 6.5 - 8.1 g/dL Final  . Albumin 07/08/2018 4.1  3.5 - 5.0 g/dL Final  . AST 07/08/2018 26  15 - 41 U/L Final  . ALT 07/08/2018 23  0 - 44 U/L Final  . Alkaline Phosphatase 07/08/2018 68  38 - 126 U/L Final  . Total Bilirubin 07/08/2018 0.9  0.3 - 1.2 mg/dL Final  . GFR calc non Af Amer 07/08/2018 >60  >60 mL/min Final  . GFR calc Af Amer 07/08/2018 >60  >60 mL/min Final   Comment: (NOTE) The eGFR has been calculated using the CKD EPI equation. This calculation has not been validated in all clinical situations. eGFR's persistently <60 mL/min signify possible Chronic Kidney Disease.   Georgiann Hahn gap 07/08/2018 10  5 - 15 Final   Performed at [facility], [address] [place], [place] [address]  . WBC 07/08/2018 5.9  3.6 - 11.0 K/uL Final  . RBC 07/08/2018 4.11  3.80 - 5.20 MIL/uL Final  . Hemoglobin 07/08/2018 13.7  12.0 - 16.0 g/dL Final  . HCT 07/08/2018 40.4  35.0 - 47.0 % Final  . MCV 07/08/2018 98.2  80.0 - 100.0 fL Final  . MCH 07/08/2018 33.3  26.0 - 34.0 pg Final  . MCHC 07/08/2018 33.9  32.0 - 36.0 g/dL Final  . RDW 07/08/2018 14.1  11.5 - 14.5 % Final  . Platelets 07/08/2018 256  150 - 440 K/uL Final  . Neutrophils Relative % 07/08/2018 79  % Final  . Neutro Abs 07/08/2018 4.6  1.4 - 6.5 K/uL Final  . Lymphocytes Relative 07/08/2018 7  % Final  . Lymphs Abs 07/08/2018 0.4* 1.0 - 3.6 K/uL Final  . Monocytes Relative 07/08/2018 11  % Final  . Monocytes Absolute 07/08/2018 0.7  0.2 - 0.9 K/uL Final  . Eosinophils Relative 07/08/2018 2  % Final  . Eosinophils Absolute 07/08/2018 0.1  0 - 0.7 K/uL Final  . Basophils Relative 07/08/2018 1  % Final  . Basophils Absolute 07/08/2018 0.1  0 - 0.1 K/uL Final   Performed at [facility], [address], [place], [place] [address]     Assessment:  [name] is a 84 y.o. female with a history of stage IIIA follicular  non-Hodgkin's lymphoma.  She presented in the summer of 2003 with an epigastric and left upper quadrant mass and left arm weakness with decreased mobility. Abdominal ultrasound revealed a large mass in the head of the pancreas with  multiple enlarged lymph nodes and a large lobulated mass in the right retroperitoneal area.  MRI of the left brachial plexus revealed a 2.5 cm soft tissue mass and abnormal lymph node along the left brachial plexus and in the supraclavicular region.  Cervical node biopsy on 08/06/2002 revealed a grade I follicular non-Hodgkin's lymphoma.  Bone marrow on 08/09/2002 was negative.  Lumbar puncture was negative.  She received Leukeran and Rituxan. As her arm pain and mobility was not relieved, she received local radiation to the left brachial plexus area. Leukeran continued through 02/28/2013 (discontinued secondary side effects).  She developed progressive disease in 10/2003. Patient received CVP chemotherapy beginning 01/02/2004. In 06/2004 she was switched to CNOP.  She received 8 cycles completing on 01/15/2005.  She began maintenance Rituxan on 04/23/2004 (4 weekly doses every 3 months for 2 years). Therapy completed on 09/13/2008.    Excision of a 1 cm right preauricular mass on 10/04/2017 revealed a low grade follicular lymphoma.  CT guided biopsy of the retroperitoneal nodal mass on 11/20/2017 revealed grade I follicular lymphoma.  Immunohistochemistry revealed positive for CD10, BCL-2, BCL-6, Ki67 (15-20%) and negative for cyclin D1 and CD5.  Flow cytometry revealed a CD10+ clonal B-cell population.   Chest, abdomen, and pelvic CT on 10/29/2017 revealed an enlarged mass in the porta hepatis at site of prior presumed lymph node. Differential would include isolated recurrent lymphadenopathy from non-Hodgkin's lymphoma versus gastrointestinal stromal tumor. Lesion was 5.8 x 4.9 cm  (compared to 2.5 x 3.3 cm on 09/26/2015).   Chest, abdomen, and pelvic CT on 04/01/2018 revealed roughly stable size and appearance of portacaval lymphadenopathy, which was compatible with residual disease.  Nodal mass measured 5.8 x 4.8 cm (previously 5.9 x 5.0 cm on 10/29/2017).   She received 4 weekly cycles of Rituxan (12/24/2017 - 01/14/2018).  She began maintenance Rituxan on 03/24/2018.  The following studies were negative on 10/23/2017:  hepatitis B core antibody, hepatitis B surface antigen, hepatitis C antibody.  G6PD assay was normal.  She was admitted to [facility] from 08/15/2015 - 08/25/2015 with bloody diarrhea then hemolytic uremic syndrome related to E. coli 0157.  She had a complicated course with acute renal failure and anemia.  She was then in rehab for 3 weeks.  She continues to work on her balance.  Bone density on 10/27/2017 revealed osteoporosis with a T-score of -3.8 in the left forearm radius.  She began every 6 month Prolia on 04/20/2018.  Symptomatically, patient is doing well. She has no acute complaints. No B symptoms or interval infections.  Her weight is down 1 pound.  Exam reveals no adenopathy or hepatosplenomegaly. She has an essential tremor that is stable.   Plan: 1. Labs today:  CBC with diff, CMP, LDH, uric acid. 2. Review plans maintenance Rituxan (every 3 months x 2 years if disease stable/improving).  No plan for chemotherapy unless disease is growing.  3. Labs reviewed. Blood counts stable and adequate enough for treatment. Will proceed with cycle #2 maintenance Rituxan today as planned. Discuss symptom management.  Patient has antiemetics and pain medications at home to use on a PRN basis. Patient  advising that the  prescribed interventions are adequate at this point. Continue all medications as previously prescribed.  Continue allopurinol as previously  prescribed. Continue calcium 1200 mg and vitamin D 800 IU daily.  RTC on 09/20/2018 for labs (BMP) +  Prolia. RTC in 3 months for MD assessment, labs (CBC with diff, CMP, LDH, uric acid), and maintenance cycle #3 Rituxan.   Honor Loh, NP  07/08/2018, 9:25 AM   I saw and evaluated the patient, participating in the key portions of the service and reviewing pertinent diagnostic studies and records.  I reviewed the nurse practitioner's note and agree with the findings and the plan.  The assessment and plan were discussed with the patient.  Several questions were asked by the patient and answered.   Nolon Stalls, MD 07/08/2018,9:25 AM

## 2018-07-08 NOTE — Progress Notes (Signed)
Patient offers no complaints today.  States she has started omeprazole since last visit.

## 2018-07-21 ENCOUNTER — Ambulatory Visit: Payer: Medicare Other

## 2018-08-15 ENCOUNTER — Encounter: Payer: Self-pay | Admitting: Hematology and Oncology

## 2018-09-13 ENCOUNTER — Other Ambulatory Visit: Payer: Self-pay | Admitting: Urgent Care

## 2018-09-16 ENCOUNTER — Telehealth: Payer: Self-pay | Admitting: *Deleted

## 2018-09-16 NOTE — Telephone Encounter (Signed)
Prolia and lab --- push out to 10/21/18 per [facility] 09/16/18 staff message. Left a message on patient's vmail. To make her aware of her new appts  date and time.

## 2018-09-20 ENCOUNTER — Inpatient Hospital Stay: Payer: Medicare Other

## 2018-10-01 ENCOUNTER — Other Ambulatory Visit: Payer: Medicare Other

## 2018-10-01 ENCOUNTER — Ambulatory Visit: Payer: Medicare Other | Admitting: Hematology and Oncology

## 2018-10-08 ENCOUNTER — Other Ambulatory Visit: Payer: Self-pay

## 2018-10-08 ENCOUNTER — Other Ambulatory Visit: Payer: Self-pay | Admitting: Hematology and Oncology

## 2018-10-08 ENCOUNTER — Inpatient Hospital Stay: Payer: Medicare Other | Attending: Hematology and Oncology

## 2018-10-08 ENCOUNTER — Inpatient Hospital Stay (HOSPITAL_BASED_OUTPATIENT_CLINIC_OR_DEPARTMENT_OTHER): Payer: Medicare Other | Admitting: Urgent Care

## 2018-10-08 ENCOUNTER — Inpatient Hospital Stay: Payer: Medicare Other

## 2018-10-08 VITALS — BP 148/84 | HR 65 | Temp 96.2°F | Resp 18 | Wt 110.5 lb

## 2018-10-08 DIAGNOSIS — M1990 Unspecified osteoarthritis, unspecified site: Secondary | ICD-10-CM

## 2018-10-08 DIAGNOSIS — C8288 Other types of follicular lymphoma, lymph nodes of multiple sites: Secondary | ICD-10-CM

## 2018-10-08 DIAGNOSIS — R5381 Other malaise: Secondary | ICD-10-CM

## 2018-10-08 DIAGNOSIS — Z9189 Other specified personal risk factors, not elsewhere classified: Secondary | ICD-10-CM

## 2018-10-08 DIAGNOSIS — K148 Other diseases of tongue: Secondary | ICD-10-CM

## 2018-10-08 DIAGNOSIS — I10 Essential (primary) hypertension: Secondary | ICD-10-CM | POA: Diagnosis not present

## 2018-10-08 DIAGNOSIS — C8200 Follicular lymphoma grade I, unspecified site: Secondary | ICD-10-CM

## 2018-10-08 DIAGNOSIS — Z79899 Other long term (current) drug therapy: Secondary | ICD-10-CM | POA: Insufficient documentation

## 2018-10-08 DIAGNOSIS — K1370 Unspecified lesions of oral mucosa: Secondary | ICD-10-CM | POA: Insufficient documentation

## 2018-10-08 DIAGNOSIS — R5383 Other fatigue: Secondary | ICD-10-CM | POA: Diagnosis not present

## 2018-10-08 DIAGNOSIS — Z5112 Encounter for antineoplastic immunotherapy: Secondary | ICD-10-CM | POA: Diagnosis not present

## 2018-10-08 DIAGNOSIS — G250 Essential tremor: Secondary | ICD-10-CM | POA: Insufficient documentation

## 2018-10-08 DIAGNOSIS — M810 Age-related osteoporosis without current pathological fracture: Secondary | ICD-10-CM

## 2018-10-08 DIAGNOSIS — Z8673 Personal history of transient ischemic attack (TIA), and cerebral infarction without residual deficits: Secondary | ICD-10-CM | POA: Diagnosis not present

## 2018-10-08 DIAGNOSIS — E883 Tumor lysis syndrome: Secondary | ICD-10-CM

## 2018-10-08 LAB — COMPREHENSIVE METABOLIC PANEL
ALT: 25 U/L (ref 0–44)
AST: 25 U/L (ref 15–41)
Albumin: 4.1 g/dL (ref 3.5–5.0)
Alkaline Phosphatase: 70 U/L (ref 38–126)
Anion gap: 7 (ref 5–15)
BUN: 21 mg/dL (ref 8–23)
CO2: 25 mmol/L (ref 22–32)
Calcium: 9.3 mg/dL (ref 8.9–10.3)
Chloride: 105 mmol/L (ref 98–111)
Creatinine, Ser: 0.48 mg/dL (ref 0.44–1.00)
GFR calc Af Amer: >60 mL/min (ref >60)
GFR calc non Af Amer: >60 mL/min (ref >60)
Glucose, Bld: 99 mg/dL (ref 70–99)
Potassium: 3.9 mmol/L (ref 3.5–5.1)
Sodium: 137 mmol/L (ref 135–145)
Total Bilirubin: 1 mg/dL (ref 0.3–1.2)
Total Protein: 6.2 g/dL — ABNORMAL LOW (ref 6.5–8.1)

## 2018-10-08 LAB — CBC WITH DIFFERENTIAL/PLATELET
Abs Immature Granulocytes: 0.01 10*3/uL (ref 0.00–0.07)
Basophils Absolute: 0.1 10*3/uL (ref 0.0–0.1)
Basophils Relative: 1 %
Eosinophils Absolute: 0.1 10*3/uL (ref 0.0–0.5)
Eosinophils Relative: 2 %
HCT: 36.3 % (ref 36.0–46.0)
Hemoglobin: 12.4 g/dL (ref 12.0–15.0)
Immature Granulocytes: 0 %
Lymphocytes Relative: 8 %
Lymphs Abs: 0.5 10*3/uL — ABNORMAL LOW (ref 0.7–4.0)
MCH: 32.5 pg (ref 26.0–34.0)
MCHC: 34.2 g/dL (ref 30.0–36.0)
MCV: 95 fL (ref 80.0–100.0)
Monocytes Absolute: 0.7 10*3/uL (ref 0.1–1.0)
Monocytes Relative: 11 %
Neutro Abs: 4.7 10*3/uL (ref 1.7–7.7)
Neutrophils Relative %: 78 %
Platelets: 256 10*3/uL (ref 150–400)
RBC: 3.82 MIL/uL — ABNORMAL LOW (ref 3.87–5.11)
RDW: 13.4 % (ref 11.5–15.5)
WBC: 6.1 10*3/uL (ref 4.0–10.5)
nRBC: 0 % (ref 0.0–0.2)

## 2018-10-08 LAB — URIC ACID: Uric Acid, Serum: 3.6 mg/dL (ref 2.5–7.1)

## 2018-10-08 LAB — T4, FREE: Free T4: 1.09 ng/dL (ref 0.82–1.77)

## 2018-10-08 LAB — TSH: TSH: 2.608 u[IU]/mL (ref 0.350–4.500)

## 2018-10-08 LAB — LACTATE DEHYDROGENASE: LDH: 127 U/L (ref 98–192)

## 2018-10-08 MED ORDER — SODIUM CHLORIDE 0.9 % IV SOLN: 375.0000 mg/m2 | Freq: Once | INTRAVENOUS | Status: DC

## 2018-10-08 MED ORDER — SODIUM CHLORIDE 0.9 % IV SOLN
Freq: Once | INTRAVENOUS | Status: AC
  Administered 2018-10-08: 10:00:00 via INTRAVENOUS
  Filled 2018-10-08: qty 250

## 2018-10-08 MED ORDER — ACETAMINOPHEN 325 MG PO TABS
650.0000 mg | ORAL_TABLET | Freq: Once | ORAL | Status: AC
  Administered 2018-10-08: 650 mg via ORAL
  Filled 2018-10-08: qty 2

## 2018-10-08 MED ORDER — SODIUM CHLORIDE 0.9 % IV SOLN
600.0000 mg | Freq: Once | INTRAVENOUS | Status: AC
  Administered 2018-10-08: 600 mg via INTRAVENOUS
  Filled 2018-10-08: qty 50

## 2018-10-08 MED ORDER — DIPHENHYDRAMINE HCL 25 MG PO CAPS
25.0000 mg | ORAL_CAPSULE | Freq: Once | ORAL | Status: AC
  Administered 2018-10-08: 25 mg via ORAL
  Filled 2018-10-08: qty 1

## 2018-10-08 NOTE — Progress Notes (Signed)
[facility] day:  10/08/18  Chief Complaint: [name] is a 84 y.o. female with a history of stage IIIA follicular non-Hodgkin's lymphoma who is seen for 3 month assessment prior to cycle #3 maintenance Rituxan.  HPI: The patient was last seen in the medical oncology clinic on 07/08/2018.  At that time, patient was doing well.  She had tolerated her last treatment with no significant side effects.  Patient felt generally well.  She denied any B symptoms or recent infections.  Eating well; weight down 1 pound.  Exam revealed no adenopathy or hepatosplenomegaly.  Essential tremor stable.  Patient received cycle #2 maintenance Rituxan.  In the interim, patient has been doing well overall.  Her energy remains low.  She has not appreciated any new areas of palpable adenopathy or hepatosplenomegaly.  Patient notes that she is generally fatigued following her treatment.  She notes that her treatments make her feel "out of balance" for about a week, which is improved by reducing her daily trazodone dose, and only taking it when "desperate".  Patient with recent oral concerns.  She notes that at her routine dental appointment, her dentist appreciated an area of concern on her tongue.  This prompted a visit to ENT.  Patient notes that area of concern was biopsied and found to be negative for malignancy.  Patient advises that ENT told her that area was secondary to trauma induced by her teeth.  She was advised to follow-up with dentistry for further evaluation and treatment.  Since her biopsy, patient has developed a indurated growth at the biopsy site.  Area is noted to be off-white in color, and nonpainful.  She notes that the growth commonly gets caught on her teeth when eating, which causes her discomfort.  Patient denies that she has experienced any B symptoms. She denies any interval infections. Patient advises that she maintains an adequate appetite.  She is eating well. Weight today is 110 lb 8 oz (50.1 kg), which compared to her last visit to the clinic, represents a 1 pound increase.  Patient complains of arthritic pain that she self rates 7/10 in the clinic today.   Past Medical History:  Diagnosis Date  . Cancer [facility]) 2013   Non-Hodgkin Lymphoma with chemo and rad tx  . Follicular lymphoma grade I ([place]) 08/06/2002  . 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 Hypertension   . Meniere's disease    with bilateral hearing loss  . Mini stroke [facility])    summer 2014  . Osteoporosis     Past Surgical History:  Procedure Laterality Date  . BONE MARROW BIOPSY  08/09/2002  . BREAST LUMPECTOMY  1950s   benign  . CATARACT EXTRACTION  03/2009  . LYMPH NODE BIOPSY  08/07/2002   cervical  . TUBAL LIGATION      Family History  Problem Relation Age of Onset  . Cancer Father        Throat  . Cancer Sister 23       Colon    Social History:  reports that she has never smoked. She has never used smokeless tobacco. She reports that she does not drink alcohol or use drugs.  She lives in [place].  The patient is accompanied by her husband today.  Allergies:  Allergies  Allergen Reactions  . Lisinopril Cough    Current Medications: Current Outpatient Medications  Medication Sig Dispense Refill  . Acetaminophen (TYLENOL  ARTHRITIS EXT RELIEF PO) Take 1 tablet by mouth daily as needed (pain).    [name] allopurinol (ZYLOPRIM) 300 MG tablet TAKE ONE TABLET BY MOUTH ONE TIME DAILY  90 tablet 0  . AZELASTINE & FLUTICASONE NA 1 spray each nase twice daily.    . calcium-vitamin D (CALCIUM 500/D) 500-200 MG-UNIT tablet Take 1 tablet by mouth 2 (two) times daily.     [name] denosumab (PROLIA) 60 MG/ML SOSY injection Inject 60 mg into the skin every 30 (thirty) days.    . Flax Oil-Fish Oil-Borage Oil (FISH OIL-FLAX OIL-BORAGE OIL) CAPS Take 1,000-1,400 mg by mouth daily.    . Multiple Vitamin (MULTI-VITAMINS) TABS Take 1 tablet  by mouth daily.     . Multiple Vitamins-Minerals (PRESERVISION AREDS 2 PO) Take 1 tablet by mouth daily.     [name] omeprazole (PRILOSEC) 40 MG capsule Take 40 mg by mouth daily.    . Probiotic Product (PROBIOTIC DAILY PO) Take 2 tablets by mouth daily.     . traZODone (DESYREL) 50 MG tablet TAKE 1 TO 2 TABLETS BY MOUTH DAILY AT BEDTIME AS NEEDED    . triamcinolone cream (KENALOG) 0.1 % Apply 1 application topically 2 (two) times daily.     . Turmeric Curcumin 500 MG CAPS Take 1 capsule by mouth daily.      No current facility-administered medications for this visit.      Review of Systems  Constitutional: Positive for malaise/fatigue (x 1 week following treatment). Negative for diaphoresis, fever and weight loss (up 1 pound).  HENT: Negative for congestion, nosebleeds and sore throat.        Sublingual growth  Eyes: Negative.   Respiratory: Negative for cough, hemoptysis, sputum production and shortness of breath.   Cardiovascular: Negative for chest pain, palpitations, orthopnea, leg swelling and PND.  Gastrointestinal: Positive for heartburn. Negative for abdominal pain, blood in stool, constipation, diarrhea, melena, nausea and vomiting.  Genitourinary: Negative for dysuria, frequency, hematuria and urgency.  Musculoskeletal: Positive for joint pain. Negative for back pain, falls and myalgias.  Skin: Negative for itching and rash.  Neurological: Positive for tremors (essential). Negative for dizziness, weakness and headaches.  Endo/Heme/Allergies: Does not bruise/bleed easily.  Psychiatric/Behavioral: Negative for depression, memory loss and suicidal ideas. The patient is not nervous/anxious and does not have insomnia.   All other systems reviewed and are negative.  Performance status (ECOG): 1 - Symptomatic but completely ambulatory  Vital Signs BP (!) 148/84 (BP Location: Left Arm, Patient Position: Sitting)   Pulse 65   Temp (!) 96.2 F (35.7 C) (Tympanic)   Resp 18   Wt 110 lb  8 oz (50.1 kg)   BMI 18.97 kg/m   Physical Exam  Constitutional: She is oriented to person, place, and time and well-developed, well-nourished, and in no distress.  HENT:  Head: Normocephalic and atraumatic.  Mouth/Throat: Oropharynx is clear and moist and mucous membranes are normal. Oral lesions (indurated circular growth to base of tongue. Off white in color. Not painful. ) present.  Eyes: Pupils are equal, round, and reactive to light. EOM are normal. No scleral icterus.  Neck: Normal range of motion. Neck supple. No tracheal deviation present. No thyromegaly present.  Cardiovascular: Normal rate, regular rhythm, normal heart sounds and intact distal pulses. Exam reveals no gallop and no friction rub.  No murmur heard. Pulmonary/Chest: Effort normal and breath sounds normal. No respiratory distress. She has no wheezes. She has no rales.  Abdominal: Soft. Bowel sounds are normal. She exhibits  no distension. There is no tenderness.  Musculoskeletal: Normal range of motion. She exhibits no edema or tenderness.  Lymphadenopathy:    She has no cervical adenopathy.    She has no axillary adenopathy.       Right: No inguinal and no supraclavicular adenopathy present.       Left: No inguinal and no supraclavicular adenopathy present.  Neurological: She is alert and oriented to person, place, and time.  Skin: Skin is warm and dry. No rash noted. No erythema.  Psychiatric: Mood, affect and judgment normal.  Nursing note and vitals reviewed.   Imaging studies: 09/02/2013:  Chest, abdomen, and pelvic CT revealed no significant interval change. She has some mildly enlarged intra-aortic caval retroperitoneal nodes measuring 1.6 x 1.2 cm. There were stable subcentimeter retroperitoneal nodes. 09/07/2014:  Chest, abdomen, and pelvic CT revealed no evidence of lymphoma recurrence.   09/26/2015:  Chest, abdomen, and pelvic CT revealed stable portacaval (2.5 cm) and aortocaval (1.0 cm) lymph nodes and  mild hepatomegaly.   10/29/2017:  Chest, abdomen, and pelvic CT revealed an enlarged mass in the porta hepatis at site of prior presumed lymph node. Differential would include isolated recurrent lymphadenopathy from non-Hodgkin's lymphoma versus gastrointestinal stromal tumor. Lesion was 5.8 x 4.9 cm (compared to 2.5 x 3.3 cm on 09/26/2015).  This lesion has been present but smaller at this site since 2015.  It was not present on PET-CT scan of 2009.  There was no additional metastatic disease evident in the chest, abdomen or pelvis. 04/01/2018:  Chest, abdomen, and pelvic CT  revealed roughly stable size and appearance of portacaval lymphadenopathy, which was compatible with residual disease.  Nodal mass measured 5.8 x 4.8 cm (previously 5.9 x 5.0 cm).  Note made of mild colonic diverticulosis without evidence of acute diverticulitis, numerous small densely calcified areas in the uterus that likely represent tiny calcified fibroids, and severe calcifications of the mitral annulus.   Orders Only on 10/08/2018  Component Date Value Ref Range Status  . TSH 10/08/2018 2.608  0.350 - 4.500 uIU/mL Final   Comment: Performed by a 3rd Generation assay with a functional sensitivity of <=0.01 uIU/mL. Performed at [facility], [address] [place], [place] [address]   . Free T4 10/08/2018 1.09  0.82 - 1.77 ng/dL Final   Comment: (NOTE) Biotin ingestion may interfere with free T4 tests. If the results are inconsistent with the TSH level, previous test results, or the clinical presentation, then consider biotin interference. If needed, order repeat testing after stopping biotin. Performed at [facility], [address] [place], [place] [address]   Appointment on 10/08/2018  Component Date Value Ref Range Status  . Uric Acid, Serum 10/08/2018 3.6  2.5 - 7.1 mg/dL Final   Performed at [facility], [address] [place], [place] [address]  . LDH 10/08/2018 127  98 -  192 U/L Final   Performed at [facility], [address] [place], [place] [address]  . Sodium 10/08/2018 137  135 - 145 mmol/L Final  . Potassium 10/08/2018 3.9  3.5 - 5.1 mmol/L Final  . Chloride 10/08/2018 105  98 - 111 mmol/L Final  . CO2 10/08/2018 25  22 - 32 mmol/L Final  . Glucose, Bld 10/08/2018 99  70 - 99 mg/dL Final  . BUN 10/08/2018 21  8 - 23 mg/dL Final  . Creatinine, Ser 10/08/2018 0.48  0.44 - 1.00 mg/dL Final  . Calcium 10/08/2018 9.3  8.9 -  10.3 mg/dL Final  . Total Protein 10/08/2018 6.2* 6.5 - 8.1 g/dL Final  . Albumin 10/08/2018 4.1  3.5 - 5.0 g/dL Final  . AST 10/08/2018 25  15 - 41 U/L Final  . ALT 10/08/2018 25  0 - 44 U/L Final  . Alkaline Phosphatase 10/08/2018 70  38 - 126 U/L Final  . Total Bilirubin 10/08/2018 1.0  0.3 - 1.2 mg/dL Final  . GFR calc non Af Amer 10/08/2018 >60  >60 mL/min Final  . GFR calc Af Amer 10/08/2018 >60  >60 mL/min Final   Comment: (NOTE) The eGFR has been calculated using the CKD EPI equation. This calculation has not been validated in all clinical situations. eGFR's persistently <60 mL/min signify possible Chronic Kidney Disease.   Georgiann Hahn gap 10/08/2018 7  5 - 15 Final   Performed at [facility], [address] [place], [place] [address]  . WBC 10/08/2018 6.1  4.0 - 10.5 K/uL Final  . RBC 10/08/2018 3.82* 3.87 - 5.11 MIL/uL Final  . Hemoglobin 10/08/2018 12.4  12.0 - 15.0 g/dL Final  . HCT 10/08/2018 36.3  36.0 - 46.0 % Final  . MCV 10/08/2018 95.0  80.0 - 100.0 fL Final  . MCH 10/08/2018 32.5  26.0 - 34.0 pg Final  . MCHC 10/08/2018 34.2  30.0 - 36.0 g/dL Final  . RDW 10/08/2018 13.4  11.5 - 15.5 % Final  . Platelets 10/08/2018 256  150 - 400 K/uL Final  . nRBC 10/08/2018 0.0  0.0 - 0.2 % Final  . Neutrophils Relative % 10/08/2018 78  % Final  . Neutro Abs 10/08/2018 4.7  1.7 - 7.7 K/uL Final  . Lymphocytes Relative 10/08/2018 8  % Final  . Lymphs Abs 10/08/2018 0.5* 0.7 - 4.0 K/uL Final  . Monocytes  Relative 10/08/2018 11  % Final  . Monocytes Absolute 10/08/2018 0.7  0.1 - 1.0 K/uL Final  . Eosinophils Relative 10/08/2018 2  % Final  . Eosinophils Absolute 10/08/2018 0.1  0.0 - 0.5 K/uL Final  . Basophils Relative 10/08/2018 1  % Final  . Basophils Absolute 10/08/2018 0.1  0.0 - 0.1 K/uL Final  . Immature Granulocytes 10/08/2018 0  % Final  . Abs Immature Granulocytes 10/08/2018 0.01  0.00 - 0.07 K/uL Final   Performed at [facility], [address] [place], [place] [address]    Assessment:  DAISEY CALOCA is a 84 y.o. female with a history of stage IIIA follicular non-Hodgkin's lymphoma.  She presented in the summer of 2003 with an epigastric and left upper quadrant mass and left arm weakness with decreased mobility. Abdominal ultrasound revealed a large mass in the head of the pancreas with  multiple enlarged lymph nodes and a large lobulated mass in the right retroperitoneal area.  MRI of the left brachial plexus revealed a 2.5 cm soft tissue mass and abnormal lymph node along the left brachial plexus and in the supraclavicular region.  Cervical node biopsy on 08/06/2002 revealed a grade I follicular non-Hodgkin's lymphoma.  Bone marrow on 08/09/2002 was negative.  Lumbar puncture was negative.  She received Leukeran and Rituxan. As her arm pain and mobility was not relieved, she received local radiation to the left brachial plexus area. Leukeran continued through 02/28/2013 (discontinued secondary side effects).  She developed progressive disease in 10/2003. Patient received CVP chemotherapy beginning 01/02/2004. In 06/2004 she was switched to CNOP.  She received 8 cycles completing on 01/15/2005.  She began maintenance Rituxan on 04/23/2004 (  4 weekly doses every 3 months for 2 years). Therapy completed on 09/13/2008.    Excision of a 1 cm right preauricular mass on 10/04/2017 revealed a low grade follicular lymphoma.  CT guided biopsy of the retroperitoneal nodal mass on  11/20/2017 revealed grade I follicular lymphoma.  Immunohistochemistry revealed positive for CD10, BCL-2, BCL-6, Ki67 (15-20%) and negative for cyclin D1 and CD5.  Flow cytometry revealed a CD10+ clonal B-cell population.   Chest, abdomen, and pelvic CT on 10/29/2017 revealed an enlarged mass in the porta hepatis at site of prior presumed lymph node. Differential would include isolated recurrent lymphadenopathy from non-Hodgkin's lymphoma versus gastrointestinal stromal tumor. Lesion was 5.8 x 4.9 cm (compared to 2.5 x 3.3 cm on 09/26/2015).   Chest, abdomen, and pelvic CT on 04/01/2018 revealed roughly stable size and appearance of portacaval lymphadenopathy, which was compatible with residual disease.  Nodal mass measured 5.8 x 4.8 cm (previously 5.9 x 5.0 cm on 10/29/2017).   She received 4 weekly cycles of Rituxan (12/24/2017 - 01/14/2018).  She began maintenance Rituxan on 03/24/2018.  The following studies were negative on 10/23/2017:  hepatitis B core antibody, hepatitis B surface antigen, hepatitis C antibody.  G6PD assay was normal.  She was admitted to [facility] from 08/15/2015 - 08/25/2015 with bloody diarrhea then hemolytic uremic syndrome related to E. coli 0157.  She had a complicated course with acute renal failure and anemia.  She was then in rehab for 3 weeks.  She continues to work on her balance.  Bone density on 10/27/2017 revealed osteoporosis with a T-score of -3.8 in the left forearm radius.  She began every 6 month Prolia on 04/20/2018.  Symptomatically, patient is doing well overall.  She notes that her energy is low.  She has not appreciated any new areas of palpable adenopathy.  No B symptoms or recent infections.  Exam reveals a circular growth to the base of her tongue on the left side.  Patient notes that she had a recent biopsy for further evaluation of a concern raised by her dentist.  Biopsy was negative.  Essential tremor is stable.  WBC 6100 ([place] [phone]).   Plan: 1. Labs  today: CBC with differential, CMP, LDH, uric acid 2. Stage IIIa follicular non-Hodgkin's lymphoma  Doing well overall.  No symptoms.  No recent infections.  Labs reviewed. Blood counts stable and adequate enough for treatment. Will proceed with cycle #3 maintenance Rituxan.  Reviewed plans for maintenance Rituxan every 3 months x 2 years if disease remains stable.  No plans for chemotherapy unless disease grown. 3. Oral lesion  Discuss oral lesion and need for further evaluation and possible excision.   Patient notes that ENT is not aware of the growth.  Patient states, "I am supposed to be going back to the dentist.  The ENT doctor thought it was trauma from my tooth".  Spoke with the ENT to make them aware.  Agreed to see patient for reevaluation. 4. TLS monitoring  Labs reviewed.  Electrolytes, LDH, and uric acid all normal  No evidence of TLS.  Continue routine monitoring. 5. Osteoporosis  Continues on supplemental calcium 1200 mg and vitamin D 800 IU daily.  Scheduled for labs (BMP) and Prolia on 10/21/2018. 6. RTC in 3 months for MD assessment, labs (CBC with differential, CMP, LDH, uric acid), and maintenance cycle #4 Rituxan   Honor Loh, NP  10/08/2018, 5:22 PM

## 2018-10-08 NOTE — Progress Notes (Signed)
Patient offers no complaints today. 

## 2018-10-20 ENCOUNTER — Ambulatory Visit (INDEPENDENT_AMBULATORY_CARE_PROVIDER_SITE_OTHER): Payer: Medicare Other

## 2018-10-20 DIAGNOSIS — I722 Aneurysm of renal artery: Secondary | ICD-10-CM | POA: Diagnosis not present

## 2018-10-21 ENCOUNTER — Inpatient Hospital Stay: Payer: Medicare Other | Attending: Hematology and Oncology

## 2018-10-21 ENCOUNTER — Ambulatory Visit (INDEPENDENT_AMBULATORY_CARE_PROVIDER_SITE_OTHER): Payer: Medicare Other | Admitting: Vascular Surgery

## 2018-10-21 ENCOUNTER — Inpatient Hospital Stay: Payer: Medicare Other

## 2018-10-21 ENCOUNTER — Other Ambulatory Visit: Payer: Self-pay

## 2018-10-21 ENCOUNTER — Encounter (INDEPENDENT_AMBULATORY_CARE_PROVIDER_SITE_OTHER): Payer: Self-pay | Admitting: Vascular Surgery

## 2018-10-21 VITALS — BP 149/70 | HR 71 | Resp 16 | Ht 65.0 in | Wt 111.0 lb

## 2018-10-21 DIAGNOSIS — R5383 Other fatigue: Secondary | ICD-10-CM | POA: Insufficient documentation

## 2018-10-21 DIAGNOSIS — E7800 Pure hypercholesterolemia, unspecified: Secondary | ICD-10-CM

## 2018-10-21 DIAGNOSIS — Z79899 Other long term (current) drug therapy: Secondary | ICD-10-CM | POA: Insufficient documentation

## 2018-10-21 DIAGNOSIS — C8288 Other types of follicular lymphoma, lymph nodes of multiple sites: Secondary | ICD-10-CM | POA: Insufficient documentation

## 2018-10-21 DIAGNOSIS — R5381 Other malaise: Secondary | ICD-10-CM | POA: Diagnosis not present

## 2018-10-21 DIAGNOSIS — K219 Gastro-esophageal reflux disease without esophagitis: Secondary | ICD-10-CM | POA: Diagnosis not present

## 2018-10-21 DIAGNOSIS — K1370 Unspecified lesions of oral mucosa: Secondary | ICD-10-CM | POA: Diagnosis not present

## 2018-10-21 DIAGNOSIS — R12 Heartburn: Secondary | ICD-10-CM | POA: Insufficient documentation

## 2018-10-21 DIAGNOSIS — I722 Aneurysm of renal artery: Secondary | ICD-10-CM

## 2018-10-21 DIAGNOSIS — M810 Age-related osteoporosis without current pathological fracture: Secondary | ICD-10-CM | POA: Diagnosis not present

## 2018-10-21 DIAGNOSIS — I890 Lymphedema, not elsewhere classified: Secondary | ICD-10-CM | POA: Diagnosis not present

## 2018-10-21 LAB — BASIC METABOLIC PANEL
ANION GAP: 8 (ref 5–15)
BUN: 20 mg/dL (ref 8–23)
CALCIUM: 9.4 mg/dL (ref 8.9–10.3)
CHLORIDE: 103 mmol/L (ref 98–111)
CO2: 26 mmol/L (ref 22–32)
Creatinine, Ser: 0.5 mg/dL (ref 0.44–1.00)
GFR calc non Af Amer: >60 mL/min (ref >60)
Glucose, Bld: 94 mg/dL (ref 70–99)
Potassium: 4.1 mmol/L (ref 3.5–5.1)
SODIUM: 137 mmol/L (ref 135–145)

## 2018-10-21 MED ORDER — DENOSUMAB 60 MG/ML ~~LOC~~ SOSY
60.0000 mg | PREFILLED_SYRINGE | Freq: Once | SUBCUTANEOUS | Status: AC
  Administered 2018-10-21: 60 mg via SUBCUTANEOUS
  Filled 2018-10-21: qty 1

## 2018-10-21 NOTE — Progress Notes (Signed)
MRN : [id number]  [name] is a 84 y.o. (1932-11-14) female who presents with chief complaint of  Chief Complaint  Patient presents with  . Follow-up    1 year Renal f/u  .  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 Patient denies abdominal pain or back pain, no other abdominal complaints. No changes suggesting embolic episodes.  BP has been stable.  The patient is also seen for followup evaluation regarding leg swelling.  The swelling has improved quite a bit and the pain associated with swelling has decreased substantially. There have not been any interval development of a ulcerations or wounds.  Since the previous visit the patient has been wearing graduated compression stockings and has noted little significant improvement in the lymphedema. The patient has been using compression routinely morning until night.  The patient also states elevation during the day and exercise is being done too.  There have been no interval changes in the patient's overall health care since his last visit.  Patient denies amaurosis fugax or TIA symptoms. There is no history of claudication or rest pain symptoms of the lower extremities. The patient denies angina or shortness of breath.   Duplex US of the aorta and renal arteries shows a normal right kidney small aneurysm previously noted is not identified.  No outpatient medications have been marked as taking for the 10/21/18 encounter (Office Visit) with Delana Meyer, Dolores Lory, MD.    Past Medical History:  Diagnosis Date  . Cancer [facility]) 2013   Non-Hodgkin Lymphoma with chemo and rad tx  . Follicular lymphoma grade I ([place]) 08/06/2002  . 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 Hypertension   . Meniere's disease    with bilateral hearing loss  . Mini stroke [facility])    summer 2014  . Osteoporosis     Past Surgical History:  Procedure  Laterality Date  . BONE MARROW BIOPSY  08/09/2002  . BREAST LUMPECTOMY  1950s   benign  . CATARACT EXTRACTION  03/2009  . LYMPH NODE BIOPSY  08/07/2002   cervical  . TUBAL LIGATION      Social History Social History   Tobacco Use  . Smoking status: Never Smoker  . Smokeless tobacco: Never Used  Substance Use Topics  . Alcohol use: No    Alcohol/week: 0.0 standard drinks  . Drug use: No    Family History Family History  Problem Relation Age of Onset  . Cancer Father        Throat  . Cancer Sister 68       Colon    Allergies  Allergen Reactions  . Lisinopril Cough     REVIEW OF SYSTEMS (Negative unless checked)  Constitutional: _0 Weight loss  _1 Fever  _2 Chills Cardiac: _3 Chest pain   _4 Chest pressure   _5 Palpitations   _6 Shortness of breath when laying flat   _7 Shortness of breath with exertion. Vascular:  _8 Pain in legs with walking   _9 Pain in legs at rest  _10 History of DVT   _11 Phlebitis   _12 Swelling in legs   _13 Varicose veins   _14 Non-healing ulcers Pulmonary:   _15 Uses home oxygen   _16 Productive cough   _17 Hemoptysis   _18 Wheeze  _19 COPD   _20 Asthma Neurologic:  _21 Dizziness   _22 Seizures   _23 History of stroke   _24 History of TIA  _25 Aphasia   _26 Vissual changes   _27 Weakness or numbness in arm   _28   Weakness or numbness in leg Musculoskeletal:   _0 Joint swelling   _1 Joint pain   _2 Low back pain Hematologic:  _3 Easy bruising  _4 Easy bleeding   _5 Hypercoagulable state   _6 Anemic Gastrointestinal:  _7 Diarrhea   _8 Vomiting  _9 Gastroesophageal reflux/heartburn   _10 Difficulty swallowing. Genitourinary:  _11 Chronic kidney disease   _12 Difficult urination  _13 Frequent urination   _14 Blood in urine Skin:  _15 Rashes   _16 Ulcers  Psychological:  _17 History of anxiety   _18  History of major depression.  Physical Examination  There were no vitals filed for this visit. There is no height or weight on file to calculate BMI. Gen: WD/WN, NAD Head: [place]/AT, No temporalis wasting.  Ear/Nose/Throat:  Hearing grossly intact, nares w/o erythema or drainage Eyes: PER, EOMI, sclera nonicteric.  Neck: Supple, no large masses.   Pulmonary:  Good air movement, no audible wheezing bilaterally, no use of accessory muscles.  Cardiac: RRR, no JVD Vascular: scattered varicosities present bilaterally.  Mild venous stasis changes to the legs bilaterally.  Trace edema soft pitting edema Vessel Right Left  Radial Palpable Palpable  PT Palpable Palpable  DP Palpable Palpable  Gastrointestinal: Non-distended. No guarding/no peritoneal signs.  Musculoskeletal: M/S 5/5 throughout.  No deformity or atrophy.  Neurologic: CN 2-12 intact. Symmetrical.  Speech is fluent. Motor exam as listed above. Psychiatric: Judgment intact, Mood & affect appropriate for pt's clinical situation. Dermatologic: mild venous rashes no ulcers noted.  No changes consistent with cellulitis. Lymph : No lichenification or skin changes of chronic lymphedema.  CBC Lab Results  Component Value Date   WBC 6.1 10/08/2018   HGB 12.4 10/08/2018   HCT 36.3 10/08/2018   MCV 95.0 10/08/2018   PLT 256 10/08/2018    BMET    Component Value Date/Time   NA 137 10/08/2018 0858   NA 125 (L) 11/26/2013 0954   K 3.9 10/08/2018 0858   K 3.2 (L) 11/26/2013 0954   CL 105 10/08/2018 0858   CL 91 (L) 11/26/2013 0954   CO2 25 10/08/2018 0858   CO2 29 11/26/2013 0954   GLUCOSE 99 10/08/2018 0858   GLUCOSE 96 11/26/2013 0954   BUN 21 10/08/2018 0858   BUN 10 11/26/2013 0954   CREATININE 0.48 10/08/2018 0858   CREATININE 0.70 11/26/2013 0954   CALCIUM 9.3 10/08/2018 0858   CALCIUM 8.5 11/26/2013 0954   GFRNONAA >60 10/08/2018 0858   GFRNONAA >60 11/26/2013 0954   GFRAA >60 10/08/2018 0858   GFRAA >60 11/26/2013 0954   CrCl cannot be calculated (Unknown ideal weight.).  COAG Lab Results  Component Value Date   INR 0.91 11/20/2017    Radiology No results found.   Assessment/Plan 1. Aneurysm of renal artery (HCC) Given  patient's renal artery aneurysm, optimal control of the patient's hypertension is important.  It remains small and difficult to detect and therefore no intervention is needed.  BP is acceptable today  The patient's vital signs and noninvasive studies support the renal artery stenosis is not significantly increased when compared to the previous study.  No invasive studies or intervention is indicated at this time.  The patient will continue the current antihypertensive medications, no changes at this time.  The primary medical service will continue aggressive antihypertensive therapy as per the [facility] guidelines    A total of 25 minutes was spent with this patient and greater than 50% was spent in counseling and coordination of care with the patient.  Discussion included the treatment options for vascular disease including indications for surgery and intervention.  Also discussed is the appropriate timing of treatment.  In addition medical therapy was discussed.  2. Lymphedema  No surgery or intervention at this point in time.    I have reviewed my discussion with the patient regarding lymphedema and why it  causes symptoms.  Patient will continue wearing graduated compression stockings class 1 (20-30 mmHg) on a daily basis a prescription was given. The patient is reminded to put the stockings on first thing in the morning and removing them in the evening. The patient is instructed specifically not to sleep in the stockings.   In addition, behavioral modification throughout the day will be continued.  This will include frequent elevation (such as in a recliner), use of over the counter pain medications as needed and exercise such as walking.  I have reviewed systemic causes for chronic edema such as liver, kidney and cardiac etiologies and there does not appear to be any significant changes in these organ systems over the past year.  The patient is under the impression that these organ systems are  all stable and unchanged.    The patient will continue aggressive use of the  lymph pump.  This will continue to improve the edema control and prevent sequela such as ulcers and infections.   The patient will follow-up with me on an annual basis.    3. Gastroesophageal reflux disease without esophagitis Continue PPI as already ordered, this medication has been reviewed and there are no changes at this time.  Avoidence of caffeine and alcohol  Moderate elevation of the head of the bed   4. Hypercholesterolemia Continue statin as ordered and reviewed, no changes at this time     Hortencia Pilar, MD  10/21/2018 8:31 AM

## 2018-10-22 ENCOUNTER — Inpatient Hospital Stay (HOSPITAL_BASED_OUTPATIENT_CLINIC_OR_DEPARTMENT_OTHER): Payer: Medicare Other | Admitting: Hematology and Oncology

## 2018-10-22 ENCOUNTER — Encounter: Payer: Self-pay | Admitting: Hematology and Oncology

## 2018-10-22 VITALS — BP 154/80 | HR 63 | Temp 96.1°F | Resp 18 | Wt 111.1 lb

## 2018-10-22 DIAGNOSIS — R5383 Other fatigue: Secondary | ICD-10-CM | POA: Diagnosis not present

## 2018-10-22 DIAGNOSIS — C8200 Follicular lymphoma grade I, unspecified site: Secondary | ICD-10-CM

## 2018-10-22 DIAGNOSIS — C8288 Other types of follicular lymphoma, lymph nodes of multiple sites: Secondary | ICD-10-CM | POA: Diagnosis not present

## 2018-10-22 DIAGNOSIS — K148 Other diseases of tongue: Secondary | ICD-10-CM

## 2018-10-22 DIAGNOSIS — M810 Age-related osteoporosis without current pathological fracture: Secondary | ICD-10-CM | POA: Diagnosis not present

## 2018-10-22 DIAGNOSIS — K1370 Unspecified lesions of oral mucosa: Secondary | ICD-10-CM

## 2018-10-22 DIAGNOSIS — R12 Heartburn: Secondary | ICD-10-CM

## 2018-10-22 DIAGNOSIS — R5381 Other malaise: Secondary | ICD-10-CM

## 2018-10-22 DIAGNOSIS — I722 Aneurysm of renal artery: Secondary | ICD-10-CM

## 2018-10-22 DIAGNOSIS — Z79899 Other long term (current) drug therapy: Secondary | ICD-10-CM

## 2018-10-22 NOTE — Progress Notes (Addendum)
[facility] day:  10/22/18  Chief Complaint: [name] is a 84 y.o. female with a history of stage IIIA follicular non-Hodgkin's lymphoma who is seen for reassessment.  HPI: The patient was last seen in the medical oncology clinic on 10/08/2018 by Honor Loh, NP.  At that time, she was doing well.  She had not appreciated any new areas of palpable adenopathy.  She denied any B symptoms or recent infections.  Exam revealed a circular growth to the base of her tongue on the left side.  Patient notes that she had a recent biopsy for further evaluation of a concern raised by her dentist.  Biopsy was negative.  Essential tremor was stable.  WBC was 6100 ([place] 4700).  She received maintenance cycle #3 Rituxan.  She was seen by Dr. Hortencia Pilar on 10/21/2018 for follow-up of her renal artery aneurysm.  There was no increase from the prior study.  Her lymphoma mass appeared a little bigger and compressed the renal vein on deep inspiration.  During the interim, patient has been having "aches" in her lower extremities, mainly the RIGHT. Patient is fatigued. She notes that she will be tired and ready to go back to bed by 11 am.    Past Medical History:  Diagnosis Date  . Cancer [facility]) 2013   Non-Hodgkin Lymphoma with chemo and rad tx  . Follicular lymphoma grade I ([place]) 08/06/2002  . 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 Hypertension   . Meniere's disease    with bilateral hearing loss  . Mini stroke [facility])    summer 2014  . Osteoporosis     Past Surgical History:  Procedure Laterality Date  . BONE MARROW BIOPSY  08/09/2002  . BREAST LUMPECTOMY  1950s   benign  . CATARACT EXTRACTION  03/2009  . LYMPH NODE BIOPSY  08/07/2002   cervical  . TUBAL LIGATION      Family History  Problem Relation Age of Onset  . Cancer Father        Throat  . Cancer Sister 28       Colon    Social History:  reports that she  has never smoked. She has never used smokeless tobacco. She reports that she does not drink alcohol or use drugs.  She lives in [place].  The patient is accompanied by her husband today.  Allergies:  Allergies  Allergen Reactions  . Lisinopril Cough    Current Medications: Current Outpatient Medications  Medication Sig Dispense Refill  . Acetaminophen (TYLENOL ARTHRITIS EXT RELIEF PO) Take 1 tablet by mouth daily as needed (pain).    [name] allopurinol (ZYLOPRIM) 300 MG tablet TAKE ONE TABLET BY MOUTH ONE TIME DAILY  90 tablet 0  . AZELASTINE & FLUTICASONE NA 1 spray each nase twice daily.    . calcium-vitamin D (CALCIUM 500/D) 500-200 MG-UNIT tablet Take 1 tablet by mouth 2 (two) times daily.     [name] denosumab (PROLIA) 60 MG/ML SOSY injection Inject 60 mg into the skin every 30 (thirty) days.    . Flax Oil-Fish Oil-Borage Oil (FISH OIL-FLAX OIL-BORAGE OIL) CAPS Take 1,000-1,400 mg by mouth daily.    . Multiple Vitamin (MULTI-VITAMINS) TABS Take 1 tablet by mouth daily.     . Multiple Vitamins-Minerals (PRESERVISION AREDS 2 PO) Take 1 tablet by mouth daily.     [name] omeprazole (PRILOSEC) 40 MG capsule Take 40 mg by mouth daily.    [name]  Probiotic Product (PROBIOTIC DAILY PO) Take 2 tablets by mouth daily.     . traZODone (DESYREL) 50 MG tablet TAKE 1 TO 2 TABLETS BY MOUTH DAILY AT BEDTIME AS NEEDED    . triamcinolone cream (KENALOG) 0.1 % Apply 1 application topically 2 (two) times daily.     . Turmeric Curcumin 500 MG CAPS Take 1 capsule by mouth daily.      No current facility-administered medications for this visit.     Review of Systems  Constitutional: Positive for malaise/fatigue (feels like she can go back to bed at 11 am). Negative for chills, diaphoresis, fever and weight loss (up 1 pound).       Energy level is poor.  HENT: Negative for congestion, ear discharge, ear pain, nosebleeds, sinus pain and sore throat.        Left sided sublingual growth.  Eyes: Negative.  Negative for  double vision, photophobia, pain, discharge and redness.  Respiratory: Negative.  Negative for cough, hemoptysis, sputum production and shortness of breath.   Cardiovascular: Negative.  Negative for chest pain, palpitations, orthopnea, leg swelling and PND.  Gastrointestinal: Positive for heartburn. Negative for abdominal pain, blood in stool, constipation, diarrhea, melena, nausea and vomiting.  Genitourinary: Negative.  Negative for dysuria, frequency, hematuria and urgency.  Musculoskeletal: Positive for joint pain. Negative for back pain, falls and myalgias.  Skin: Negative.  Negative for itching and rash.  Neurological: Positive for tremors (essential). Negative for dizziness, speech change, focal weakness, weakness and headaches.  Endo/Heme/Allergies: Does not bruise/bleed easily.  Psychiatric/Behavioral: Negative for depression and memory loss. The patient is not nervous/anxious and does not have insomnia.   All other systems reviewed and are negative.  Performance status (ECOG): 1  Vital Signs BP (!) 154/80   Pulse 63   Temp (!) 96.1 F (35.6 C) (Tympanic)   Resp 18   Wt 111 lb 1 oz (50.4 kg)   BMI 18.48 kg/m   Physical Exam  Constitutional: She is oriented to person, place, and time and well-developed, well-nourished, and in no distress.  HENT:  Head: Normocephalic and atraumatic.  Mouth/Throat: Oropharynx is clear and moist and mucous membranes are normal. Oral lesions present.  Short gray hair. 8 mm left sided sublingual oval lesion.  Eyes: Pupils are equal, round, and reactive to light. Conjunctivae and EOM are normal. No scleral icterus.  Glasses.  Blue eyes.  Neck: Normal range of motion. Neck supple. No JVD present.  Cardiovascular: Normal rate, regular rhythm, normal heart sounds and intact distal pulses. Exam reveals no gallop and no friction rub.  No murmur heard. Pulmonary/Chest: Effort normal and breath sounds normal. No respiratory distress. She has no wheezes.  She has no rales.  Abdominal: Soft. Bowel sounds are normal. She exhibits no distension and no mass. There is no tenderness. There is no rebound and no guarding.  Musculoskeletal: Normal range of motion. She exhibits tenderness (lower extremities). She exhibits no edema.  Lymphadenopathy:    She has no cervical adenopathy.    She has no axillary adenopathy.       Right: No inguinal and no supraclavicular adenopathy present.       Left: No inguinal and no supraclavicular adenopathy present.  Neurological: She is alert and oriented to person, place, and time. Gait normal.  Skin: Skin is warm and dry. No rash noted. No erythema.  Psychiatric: Mood, affect and judgment normal.  Nursing note and vitals reviewed.       Imaging studies: 09/02/2013:  Chest, abdomen, and pelvic CT revealed no significant interval change. She has some mildly enlarged intra-aortic caval retroperitoneal nodes measuring 1.6 x 1.2 cm. There were stable subcentimeter retroperitoneal nodes. 09/07/2014:  Chest, abdomen, and pelvic CT revealed no evidence of lymphoma recurrence.   09/26/2015:  Chest, abdomen, and pelvic CT revealed stable portacaval (2.5 cm) and aortocaval (1.0 cm) lymph nodes and mild hepatomegaly.   10/29/2017:  Chest, abdomen, and pelvic CT revealed an enlarged mass in the porta hepatis at site of prior presumed lymph node. Differential would include isolated recurrent lymphadenopathy from non-Hodgkin's lymphoma versus gastrointestinal stromal tumor. Lesion was 5.8 x 4.9 cm (compared to 2.5 x 3.3 cm on 09/26/2015).  This lesion has been present but smaller at this site since 2015.  It was not present on PET-CT scan of 2009.  There was no additional metastatic disease evident in the chest, abdomen or pelvis. 04/01/2018:  Chest, abdomen, and pelvic CT  revealed roughly stable size and appearance of portacaval lymphadenopathy, which was compatible with residual disease.  Nodal mass measured 5.8 x 4.8 cm  (previously 5.9 x 5.0 cm).  Note made of mild colonic diverticulosis without evidence of acute diverticulitis, numerous small densely calcified areas in the uterus that likely represent tiny calcified fibroids, and severe calcifications of the mitral annulus.   Orders Only on 10/21/2018  Component Date Value Ref Range Status  . Sodium 10/21/2018 137  135 - 145 mmol/L Final  . Potassium 10/21/2018 4.1  3.5 - 5.1 mmol/L Final  . Chloride 10/21/2018 103  98 - 111 mmol/L Final  . CO2 10/21/2018 26  22 - 32 mmol/L Final  . Glucose, Bld 10/21/2018 94  70 - 99 mg/dL Final  . BUN 10/21/2018 20  8 - 23 mg/dL Final  . Creatinine, Ser 10/21/2018 0.50  0.44 - 1.00 mg/dL Final  . Calcium 10/21/2018 9.4  8.9 - 10.3 mg/dL Final  . GFR calc non Af Amer 10/21/2018 >60  >60 mL/min Final  . GFR calc Af Amer 10/21/2018 >60  >60 mL/min Final   Comment: (NOTE) The eGFR has been calculated using the CKD EPI equation. This calculation has not been validated in all clinical situations. eGFR's persistently <60 mL/min signify possible Chronic Kidney Disease.   [name] gap 10/21/2018 8  5 - 15 Final   Performed at [facility], [address] [place], [place] [address]    Assessment:  BRANDACE CARGLE is a 84 y.o. female with a history of stage IIIA follicular non-Hodgkin's lymphoma.  She presented in the summer of 2003 with an epigastric and left upper quadrant mass and left arm weakness with decreased mobility. Abdominal ultrasound revealed a large mass in the head of the pancreas with  multiple enlarged lymph nodes and a large lobulated mass in the right retroperitoneal area.  MRI of the left brachial plexus revealed a 2.5 cm soft tissue mass and abnormal lymph node along the left brachial plexus and in the supraclavicular region.  Cervical node biopsy on 08/06/2002 revealed a grade I follicular non-Hodgkin's lymphoma.  Bone marrow on 08/09/2002 was negative.  Lumbar puncture was negative.  She received  Leukeran and Rituxan. As her arm pain and mobility was not relieved, she received local radiation to the left brachial plexus area. Leukeran continued through 02/28/2013 (discontinued secondary side effects).  She developed progressive disease in 10/2003. Patient received CVP chemotherapy beginning 01/02/2004. In 06/2004 she was switched to CNOP.  She received 8 cycles completing on 01/15/2005.  She began maintenance Rituxan on 04/23/2004 (4 weekly doses every 3 months for 2 years). Therapy completed on 09/13/2008.    Excision of a 1 cm right preauricular mass on 10/04/2017 revealed a low grade follicular lymphoma.  CT guided biopsy of the retroperitoneal nodal mass on 11/20/2017 revealed grade I follicular lymphoma.  Immunohistochemistry revealed positive for CD10, BCL-2, BCL-6, Ki67 (15-20%) and negative for cyclin D1 and CD5.  Flow cytometry revealed a CD10+ clonal B-cell population.   Chest, abdomen, and pelvic CT on 10/29/2017 revealed an enlarged mass in the porta hepatis at site of prior presumed lymph node. Differential would include isolated recurrent lymphadenopathy from non-Hodgkin's lymphoma versus gastrointestinal stromal tumor. Lesion was 5.8 x 4.9 cm (compared to 2.5 x 3.3 cm on 09/26/2015).   Chest, abdomen, and pelvic CT on 04/01/2018 revealed roughly stable size and appearance of portacaval lymphadenopathy, which was compatible with residual disease.  Nodal mass measured 5.8 x 4.8 cm (previously 5.9 x 5.0 cm on 10/29/2017).   She received 4 weekly cycles of Rituxan (12/24/2017 - 01/14/2018).  She is s/p 3 cycles of maintenance Rituxan (03/24/2018 - 10/08/2018).  The following studies were negative on 10/23/2017:  hepatitis B core antibody, hepatitis B surface antigen, hepatitis C antibody.  G6PD assay was normal.  She was admitted to [facility] from 08/15/2015 - 08/25/2015 with bloody diarrhea then hemolytic uremic syndrome related to E. coli 0157.  She had a complicated course with acute  renal failure and anemia.  She was then in rehab for 3 weeks.  She continues to work on her balance.  Bone density on 10/27/2017 revealed osteoporosis with a T-score of -3.8 in the left forearm radius.  She began every 6 month Prolia on 04/20/2018 (last 10/21/2018).  Symptomatically, she is fatigued.  Exam reveals an 8 mm left sided sublingual lesion.  She has no palpable adenopathy or hepatosplenomegaly.  Plan: 1.   Labs today:  CBC with diff, BMP, LDH, uric acid. 2.   Stage IIIa follicular non-Hodgkin's lymphoma: Discuss interval finding by Dr. Delana Meyer- lymphoma mass appeared a little bigger and compressed the renal vein on deep inspiration. Discuss plans for restaging studies. 3.   Oral lesion: Discuss dental and ENT evaluation. Lesion s/p biopsy (benign per patient report). 4.   Osteoporosis:  Continue oral calcium and vitamin D.  Patient received Prolia on 10/21/2018. 5.   Schedule chest, abdomen, and pelvic CT 6.   RTC for MD assessment after CT scans for discussion regarding direction of therapy.     Honor Loh, NP  10/22/2018, 9:58 AM   I saw and evaluated the patient, participating in the key portions of the service and reviewing pertinent diagnostic studies and records.  I reviewed the nurse practitioner's note and agree with the findings and the plan.  The assessment and plan were discussed with the patient.  Multiple questions were asked by the patient and answered.   Nolon Stalls, MD 10/22/2018,9:58 AM

## 2018-10-22 NOTE — Progress Notes (Signed)
Patient offers no complaints today. 

## 2018-10-29 ENCOUNTER — Ambulatory Visit
Admission: RE | Admit: 2018-10-29 | Discharge: 2018-10-29 | Disposition: A | Discharge status: Home / Self Care | Payer: Medicare Other | Source: Ambulatory Visit | Attending: Urgent Care | Admitting: Urgent Care

## 2018-10-29 DIAGNOSIS — C8200 Follicular lymphoma grade I, unspecified site: Secondary | ICD-10-CM | POA: Diagnosis present

## 2018-10-29 DIAGNOSIS — I700 Atherosclerosis of aorta: Secondary | ICD-10-CM | POA: Diagnosis not present

## 2018-10-29 MED ORDER — IOPAMIDOL (ISOVUE-300) INJECTION 61%
75.0000 mL | Freq: Once | INTRAVENOUS | Status: AC | PRN
  Administered 2018-10-29: 75 mL via INTRAVENOUS

## 2018-11-02 ENCOUNTER — Other Ambulatory Visit: Payer: Self-pay | Admitting: Hematology and Oncology

## 2018-11-02 ENCOUNTER — Inpatient Hospital Stay (HOSPITAL_BASED_OUTPATIENT_CLINIC_OR_DEPARTMENT_OTHER): Payer: Medicare Other | Admitting: Hematology and Oncology

## 2018-11-02 VITALS — BP 143/82 | HR 65 | Temp 97.8°F | Resp 18 | Wt 112.2 lb

## 2018-11-02 DIAGNOSIS — R5383 Other fatigue: Secondary | ICD-10-CM | POA: Diagnosis not present

## 2018-11-02 DIAGNOSIS — I722 Aneurysm of renal artery: Secondary | ICD-10-CM | POA: Diagnosis not present

## 2018-11-02 DIAGNOSIS — R5381 Other malaise: Secondary | ICD-10-CM

## 2018-11-02 DIAGNOSIS — R12 Heartburn: Secondary | ICD-10-CM | POA: Diagnosis not present

## 2018-11-02 DIAGNOSIS — K1370 Unspecified lesions of oral mucosa: Secondary | ICD-10-CM | POA: Diagnosis not present

## 2018-11-02 DIAGNOSIS — K148 Other diseases of tongue: Secondary | ICD-10-CM

## 2018-11-02 DIAGNOSIS — M180 Bilateral primary osteoarthritis of first carpometacarpal joints: Secondary | ICD-10-CM

## 2018-11-02 DIAGNOSIS — Z79899 Other long term (current) drug therapy: Secondary | ICD-10-CM

## 2018-11-02 DIAGNOSIS — C8200 Follicular lymphoma grade I, unspecified site: Secondary | ICD-10-CM

## 2018-11-02 DIAGNOSIS — M810 Age-related osteoporosis without current pathological fracture: Secondary | ICD-10-CM | POA: Diagnosis not present

## 2018-11-02 DIAGNOSIS — C8288 Other types of follicular lymphoma, lymph nodes of multiple sites: Secondary | ICD-10-CM

## 2018-11-02 NOTE — Progress Notes (Signed)
DISCONTINUE ON PATHWAY REGIMEN - Lymphoma and CLL     Administer weekly:     Rituximab   **Always confirm dose/schedule in your pharmacy ordering system**  REASON: Disease Progression PRIOR TREATMENT: LYOS201: Rituximab 375 mg/m2 IV Weekly x 4 Weeks TREATMENT RESPONSE: Progressive Disease (PD)    Patient Characteristics: Disease Type: Follicular Lymphoma Disease Type: Not Applicable Disease Type: Not Applicable

## 2018-11-02 NOTE — Progress Notes (Signed)
Patient here today for imaging results.  Patient offers no complaints today.  She does have questions regarding Biotin that she has been taking.  She also wants to know about taking B-12 for energy.

## 2018-11-02 NOTE — Progress Notes (Signed)
[facility] day:  11/02/18  Chief Complaint: [name] is a 84 y.o. female with a history of stage IIIA follicular non-Hodgkin's lymphoma who is seen for review of interval imaging studies and discussion regarding direction of therapy.  HPI: The patient was last seen in the medical oncology clinic on 10/22/2018.  At that time, she noted fatigue and aches in her lower extremities.  She had a lesion under her tongue.  Recent evaluation by Dr. Delana Meyer suggested her lymphoma mass was a little bigger and compressed the renal vein on deep inspiration.  Decision was made to pursue formal CT imaging.  Chest, abdomen, and pelvic CT on 10/29/2018 revealed slight interval increase in size of the large right retroperitoneal mass (7.1 x 5.8 cm; previously 5.7 x 5.1 cm) generating mass-effect on the IVC and right renal vein. There was possibly a small adjacent aortocaval lymph node that was stable.  There was no other lymphadenopathy in the chest, abdomen, or pelvis.  During the interim, she has continued to feel fatigued.  She is wondering whether she should take oral B12.  She went to the dentist and buffed her tooth.  This has helped the tongue lesion on the left.  She denies any fevers or sweats.   Past Medical History:  Diagnosis Date  . Cancer [facility]) 2013   Non-Hodgkin Lymphoma with chemo and rad tx  . Follicular lymphoma grade I ([place]) 08/06/2002  . 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 Hypertension   . Meniere's disease    with bilateral hearing loss  . Mini stroke [facility])    summer 2014  . Osteoporosis     Past Surgical History:  Procedure Laterality Date  . BONE MARROW BIOPSY  08/09/2002  . BREAST LUMPECTOMY  1950s   benign  . CATARACT EXTRACTION  03/2009  . LYMPH NODE BIOPSY  08/07/2002   cervical  . TUBAL LIGATION      Family History  Problem Relation Age of Onset  . Cancer Father        Throat  .  Cancer Sister 70       Colon    Social History:  reports that she has never smoked. She has never used smokeless tobacco. She reports that she does not drink alcohol or use drugs.  She lives in [place].  The patient is accompanied by her husband today.  Allergies:  Allergies  Allergen Reactions  . Lisinopril Cough    Current Medications: Current Outpatient Medications  Medication Sig Dispense Refill  . Acetaminophen (TYLENOL ARTHRITIS EXT RELIEF PO) Take 1 tablet by mouth daily as needed (pain).    [name] allopurinol (ZYLOPRIM) 300 MG tablet TAKE ONE TABLET BY MOUTH ONE TIME DAILY  90 tablet 0  . AZELASTINE & FLUTICASONE NA 1 spray each nase twice daily.    . calcium-vitamin D (CALCIUM 500/D) 500-200 MG-UNIT tablet Take 1 tablet by mouth 2 (two) times daily.     [name] denosumab (PROLIA) 60 MG/ML SOSY injection Inject 60 mg into the skin every 30 (thirty) days.    . Flax Oil-Fish Oil-Borage Oil (FISH OIL-FLAX OIL-BORAGE OIL) CAPS Take 1,000-1,400 mg by mouth daily.    . Multiple Vitamin (MULTI-VITAMINS) TABS Take 1 tablet by mouth daily.     . Multiple Vitamins-Minerals (PRESERVISION AREDS 2 PO) Take 1 tablet by mouth daily.     [name] omeprazole (PRILOSEC) 40 MG capsule Take 40 mg by mouth  daily.    . Probiotic Product (PROBIOTIC DAILY PO) Take 2 tablets by mouth daily.     . traZODone (DESYREL) 50 MG tablet TAKE 1 TO 2 TABLETS BY MOUTH DAILY AT BEDTIME AS NEEDED    . triamcinolone cream (KENALOG) 0.1 % Apply 1 application topically 2 (two) times daily.     . Turmeric Curcumin 500 MG CAPS Take 1 capsule by mouth daily.      No current facility-administered medications for this visit.     Review of Systems  Constitutional: Negative for diaphoresis, fever and weight loss (up 1 pound).       Fatigue.  HENT: Negative for congestion, ear discharge, ear pain, nosebleeds, sinus pain and sore throat.        Sublingual growth.  Dentist buffed tooth- helped.  Eyes: Negative.  Negative for blurred  vision, double vision, photophobia, pain, discharge and redness.  Respiratory: Negative.  Negative for cough, hemoptysis, sputum production and shortness of breath.   Cardiovascular: Negative.  Negative for chest pain, palpitations, orthopnea, leg swelling and PND.  Gastrointestinal: Negative.  Negative for abdominal pain, blood in stool, constipation, diarrhea, heartburn, melena, nausea and vomiting.  Genitourinary: Negative.  Negative for dysuria, frequency, hematuria and urgency.  Musculoskeletal: Positive for joint pain. Negative for back pain, falls and myalgias.  Skin: Negative.  Negative for itching and rash.  Neurological: Positive for tremors (essential). Negative for dizziness, sensory change, speech change, focal weakness, weakness and headaches.  Endo/Heme/Allergies: Negative.  Does not bruise/bleed easily.  Psychiatric/Behavioral: Negative for depression and memory loss. The patient is not nervous/anxious and does not have insomnia.   All other systems reviewed and are negative.  Performance status (ECOG): 1  Vital Signs BP (!) 143/82 (BP Location: Left Arm, Patient Position: Sitting)   Pulse 65   Temp 97.8 F (36.6 C) (Tympanic)   Resp 18   Wt 112 lb 4 oz (50.9 kg)   BMI 18.68 kg/m   Physical Exam  Constitutional: She is oriented to person, place, and time and well-developed, well-nourished, and in no distress. No distress.  HENT:  Head: Normocephalic and atraumatic.  Mouth/Throat: Oropharynx is clear and moist and mucous membranes are normal. Oral lesions present. No oropharyngeal exudate.  Left sided oval sublingual lesion with white edges.  Eyes: Pupils are equal, round, and reactive to light. Conjunctivae and EOM are normal. Right eye exhibits no discharge. Left eye exhibits no discharge. No scleral icterus.  Neck: Normal range of motion. Neck supple.  Lymphadenopathy:    She has no cervical adenopathy.  Neurological: She is alert and oriented to person, place, and  time. Gait normal.  Skin: She is not diaphoretic.  Psychiatric: Mood, affect and judgment normal.  Nursing note and vitals reviewed.       Imaging studies: 09/02/2013:  Chest, abdomen, and pelvic CT revealed no significant interval change. She has some mildly enlarged intra-aortic caval retroperitoneal nodes measuring 1.6 x 1.2 cm. There were stable subcentimeter retroperitoneal nodes. 09/07/2014:  Chest, abdomen, and pelvic CT revealed no evidence of lymphoma recurrence.   09/26/2015:  Chest, abdomen, and pelvic CT revealed stable portacaval (2.5 cm) and aortocaval (1.0 cm) lymph nodes and mild hepatomegaly.   10/29/2017:  Chest, abdomen, and pelvic CT revealed an enlarged mass in the porta hepatis at site of prior presumed lymph node. Differential would include isolated recurrent lymphadenopathy from non-Hodgkin's lymphoma versus gastrointestinal stromal tumor. Lesion was 5.8 x 4.9 cm (compared to 2.5 x 3.3 cm on 09/26/2015).  This lesion has been present but smaller at this site since 2015.  It was not present on PET-CT scan of 2009.  There was no additional metastatic disease evident in the chest, abdomen or pelvis. 04/01/2018:  Chest, abdomen, and pelvic CT  revealed roughly stable size and appearance of portacaval lymphadenopathy, which was compatible with residual disease.  Nodal mass measured 5.8 x 4.8 cm (previously 5.9 x 5.0 cm).  Note made of mild colonic diverticulosis without evidence of acute diverticulitis, numerous small densely calcified areas in the uterus that likely represent tiny calcified fibroids, and severe calcifications of the mitral annulus. 10/29/2018:  Chest, abdomen, and pelvic CT  revealed slight interval increase in size of the large right retroperitoneal mass (7.1 x 5.8 cm; previously 5.7 x 5.1 cm) generating mass-effect on the IVC and right renal vein. There was possibly a small adjacent aortocaval lymph node that was stable.  There was no other lymphadenopathy in  the chest, abdomen, or pelvis.   No visits with results within 3 Day(s) from this visit.  Latest known visit with results is:  Orders Only on 10/21/2018  Component Date Value Ref Range Status  . Sodium 10/21/2018 137  135 - 145 mmol/L Final  . Potassium 10/21/2018 4.1  3.5 - 5.1 mmol/L Final  . Chloride 10/21/2018 103  98 - 111 mmol/L Final  . CO2 10/21/2018 26  22 - 32 mmol/L Final  . Glucose, Bld 10/21/2018 94  70 - 99 mg/dL Final  . BUN 10/21/2018 20  8 - 23 mg/dL Final  . Creatinine, Ser 10/21/2018 0.50  0.44 - 1.00 mg/dL Final  . Calcium 10/21/2018 9.4  8.9 - 10.3 mg/dL Final  . GFR calc non Af Amer 10/21/2018 >60  >60 mL/min Final  . GFR calc Af Amer 10/21/2018 >60  >60 mL/min Final   Comment: (NOTE) The eGFR has been calculated using the CKD EPI equation. This calculation has not been validated in all clinical situations. eGFR's persistently <60 mL/min signify possible Chronic Kidney Disease.   Georgiann Hahn gap 10/21/2018 8  5 - 15 Final   Performed at [facility], [address] [place], [place] [address]    Assessment:  [name] is a 84 y.o. female with a history of stage IIIA follicular non-Hodgkin's lymphoma.  She presented in the summer of 2003 with an epigastric and left upper quadrant mass and left arm weakness with decreased mobility. Abdominal ultrasound revealed a large mass in the head of the pancreas with  multiple enlarged lymph nodes and a large lobulated mass in the right retroperitoneal area.  MRI of the left brachial plexus revealed a 2.5 cm soft tissue mass and abnormal lymph node along the left brachial plexus and in the supraclavicular region.  Cervical node biopsy on 08/06/2002 revealed a grade I follicular non-Hodgkin's lymphoma.  Bone marrow on 08/09/2002 was negative.  Lumbar puncture was negative.  She received Leukeran and Rituxan. As her arm pain and mobility was not relieved, she received local radiation to the left brachial plexus area.  Leukeran continued through 02/28/2013 (discontinued secondary side effects).  She developed progressive disease in 10/2003. Patient received CVP chemotherapy beginning 01/02/2004. In 06/2004 she was switched to CNOP.  She received 8 cycles completing on 01/15/2005.  She began maintenance Rituxan on 04/23/2004 (4 weekly doses every 3 months for 2 years). Therapy completed on 09/13/2008.    Excision of a 1 cm right preauricular mass on 10/04/2017 revealed a low grade follicular lymphoma.  CT guided biopsy of the retroperitoneal nodal mass on 11/20/2017 revealed grade I follicular lymphoma.  Immunohistochemistry revealed positive for CD10, BCL-2, BCL-6, Ki67 (15-20%) and negative for cyclin D1 and CD5.  Flow cytometry revealed a CD10+ clonal B-cell population.   Chest, abdomen, and pelvic CT on 10/29/2017 revealed an enlarged mass in the porta hepatis at site of prior presumed lymph node. Differential would include isolated recurrent lymphadenopathy from non-Hodgkin's lymphoma versus gastrointestinal stromal tumor. Lesion was 5.8 x 4.9 cm (compared to 2.5 x 3.3 cm on 09/26/2015).   Chest, abdomen, and pelvic CT on 04/01/2018 revealed roughly stable size and appearance of portacaval lymphadenopathy, which was compatible with residual disease.  Nodal mass measured 5.8 x 4.8 cm (previously 5.9 x 5.0 cm on 10/29/2017).   Chest, abdomen, and pelvic CT on 10/29/2018 revealed slight interval increase in size of the large right retroperitoneal mass (7.1 x 5.8 cm; previously 5.7 x 5.1 cm) generating mass-effect on the IVC and right renal vein. There was possibly a small adjacent aortocaval lymph node that was stable.  There was no other lymphadenopathy in the chest, abdomen, or pelvis.  She received 4 weekly cycles of Rituxan (12/24/2017 - 01/14/2018).  She is s/p 3 cycles of maintenance Rituxan (03/24/2018 - 10/08/2018).  The following studies were negative on 10/23/2017:  hepatitis B core antibody, hepatitis B  surface antigen, hepatitis C antibody.  G6PD assay was normal.  She was admitted to [facility] from 08/15/2015 - 08/25/2015 with bloody diarrhea then hemolytic uremic syndrome related to E. coli 0157.  She had a complicated course with acute renal failure and anemia.  She was then in rehab for 3 weeks.  She continues to work on her balance.  Bone density on 10/27/2017 revealed osteoporosis with a T-score of -3.8 in the left forearm radius.  She began every 6 month Prolia on 04/20/2018 (last 10/21/2018).  Symptomatically, she remains fatigued.  The mass on the left side of her tongue is better s/p buffing of her tooth by her dentist.  She denies any B symptoms.   Plan: 1. Stage IIIa follicular non-Hodgkin's lymphoma:  Review interval CT scans.  Images reviewed personally with the patient and her husband.  Discuss no other adenopathy besides the enlarging retroperitoneal mass.  Prior biopsy revealed follicular lymphoma ([id number]).  Discuss consideration of PET scan to determine if still low grade lymphoma or potential Richter's (trnsformation to a higher grade lymphoma).  Discuss treatment options: 2. Oral lesion: Persistent left sided sublingual lesion. Dental buffing of tooth as helped. Continue to monitor. 3. Fatigue:  Etiology possibly related to active lymphoma.  TSH was normal on 10/08/2018.  Patient inquiring about B12 supplementation.  Consider checking level.  4.  Schedule PET scan. 5.  RTC after PET scan for MD assessment and discussion regarding direction of therapy.   Lequita Asal, MD  11/02/2018, 4:35 PM

## 2018-11-08 ENCOUNTER — Other Ambulatory Visit: Payer: Self-pay

## 2018-11-08 ENCOUNTER — Encounter: Payer: Self-pay | Admitting: Hematology and Oncology

## 2018-11-08 ENCOUNTER — Encounter
Admission: RE | Admit: 2018-11-08 | Discharge: 2018-11-08 | Disposition: A | Discharge status: Home / Self Care | Payer: Medicare Other | Source: Ambulatory Visit | Attending: Hematology and Oncology | Admitting: Hematology and Oncology

## 2018-11-08 ENCOUNTER — Other Ambulatory Visit: Payer: Self-pay | Admitting: Hematology and Oncology

## 2018-11-08 ENCOUNTER — Inpatient Hospital Stay (HOSPITAL_BASED_OUTPATIENT_CLINIC_OR_DEPARTMENT_OTHER): Payer: Medicare Other | Admitting: Hematology and Oncology

## 2018-11-08 VITALS — BP 145/77 | HR 74 | Temp 98.4°F | Resp 12 | Ht 65.0 in | Wt 111.7 lb

## 2018-11-08 DIAGNOSIS — R5381 Other malaise: Secondary | ICD-10-CM

## 2018-11-08 DIAGNOSIS — K1370 Unspecified lesions of oral mucosa: Secondary | ICD-10-CM

## 2018-11-08 DIAGNOSIS — C8288 Other types of follicular lymphoma, lymph nodes of multiple sites: Secondary | ICD-10-CM | POA: Diagnosis not present

## 2018-11-08 DIAGNOSIS — M810 Age-related osteoporosis without current pathological fracture: Secondary | ICD-10-CM

## 2018-11-08 DIAGNOSIS — K148 Other diseases of tongue: Secondary | ICD-10-CM | POA: Insufficient documentation

## 2018-11-08 DIAGNOSIS — Z79899 Other long term (current) drug therapy: Secondary | ICD-10-CM

## 2018-11-08 DIAGNOSIS — C8200 Follicular lymphoma grade I, unspecified site: Secondary | ICD-10-CM | POA: Insufficient documentation

## 2018-11-08 DIAGNOSIS — R12 Heartburn: Secondary | ICD-10-CM

## 2018-11-08 DIAGNOSIS — I722 Aneurysm of renal artery: Secondary | ICD-10-CM

## 2018-11-08 DIAGNOSIS — C8290 Follicular lymphoma, unspecified, unspecified site: Secondary | ICD-10-CM

## 2018-11-08 DIAGNOSIS — R5383 Other fatigue: Secondary | ICD-10-CM

## 2018-11-08 LAB — GLUCOSE, CAPILLARY: Glucose-Capillary: 84 mg/dL (ref 70–99)

## 2018-11-08 MED ORDER — FLUDEOXYGLUCOSE F - 18 (FDG) INJECTION
5.8000 | Freq: Once | INTRAVENOUS | Status: AC | PRN
  Administered 2018-11-08: 6.12 via INTRAVENOUS

## 2018-11-08 NOTE — Progress Notes (Signed)
[facility] day:  11/08/2018   Chief Complaint: [name] is a 84 y.o. female with a history of stage IIIA follicular non-Hodgkin's lymphoma who is seen for review of interval PET scan and discussion regarding direction of therapy.  HPI: The patient was last seen in the medical oncology clinic on 10/22/2018.  At that time, she remains fatigued.  The mass on the left side of her tongue was better s/p buffing of her tooth by her dentist.  She denied any B symptoms.  CT scans revealed s 7.1 x 5.8 cm retroperitoneal mass.  There was no other adenopathy.  Decision was made to pursue a PET scan to assess for transformation.  PET scan on 11/08/2018 revealed a 5.5 x 7.3 cm hypermetabolic (SUV 41.6) right retroperitoneal nodal mass and aortocaval lymph node.  The liver appeared enlarged.  Symptomatically, patient is doing well overall. She notes that the LEFT sublingual lesion continues to improve after her tooth was "filed down". Patient denies that shehas experienced any B symptoms. She denies any interval infections. She denies any new areas of palpable adenopathy.   Patient advises that she maintains an adequate appetite. She is eating well. Weight today is 111 lb 11.2 oz (50.7 kg), which compared to her last visit to the clinic, represents a stable weight.    Patient denies pain in the clinic today.   Past Medical History:  Diagnosis Date  . [facility]) 2013   Non-Hodgkin Lymphoma with chemo and rad tx  . Follicular lymphoma grade I ([place]) 08/06/2002  . 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 Hypertension   . Meniere's disease    with bilateral hearing loss  . Mini stroke [facility])    summer 2014  . Osteoporosis     Past Surgical History:  Procedure Laterality Date  . BONE MARROW BIOPSY  08/09/2002  . BREAST LUMPECTOMY  1950s   benign  . CATARACT EXTRACTION  03/2009  . LYMPH NODE BIOPSY  08/07/2002   cervical    . TUBAL LIGATION      Family History  Problem Relation Age of Onset  . Cancer Father        Throat  . Cancer Sister 36       Colon    Social History:  reports that she has never smoked. She has never used smokeless tobacco. She reports that she does not drink alcohol or use drugs.  She lives in [place].  The patient is accompanied by her husband today.  Allergies:  Allergies  Allergen Reactions  . Lisinopril Cough    Current Medications: Current Outpatient Medications  Medication Sig Dispense Refill  . Acetaminophen (TYLENOL ARTHRITIS EXT RELIEF PO) Take 1 tablet by mouth daily as needed (pain).    [name] allopurinol (ZYLOPRIM) 300 MG tablet TAKE ONE TABLET BY MOUTH ONE TIME DAILY  90 tablet 0  . AZELASTINE & FLUTICASONE NA 1 spray each nase twice daily.    . calcium-vitamin D (CALCIUM 500/D) 500-200 MG-UNIT tablet Take 1 tablet by mouth 2 (two) times daily.     [name] denosumab (PROLIA) 60 MG/ML SOSY injection Inject 60 mg into the skin every 30 (thirty) days.    . Flax Oil-Fish Oil-Borage Oil (FISH OIL-FLAX OIL-BORAGE OIL) CAPS Take 1,000-1,400 mg by mouth daily.    . Multiple Vitamin (MULTI-VITAMINS) TABS Take 1 tablet by mouth daily.     . Multiple Vitamins-Minerals (PRESERVISION AREDS 2 PO)  Take 1 tablet by mouth daily.     [name] omeprazole (PRILOSEC) 40 MG capsule Take 40 mg by mouth daily.    . Probiotic Product (PROBIOTIC DAILY PO) Take 2 tablets by mouth daily.     . traZODone (DESYREL) 50 MG tablet TAKE 1 TO 2 TABLETS BY MOUTH DAILY AT BEDTIME AS NEEDED    . triamcinolone cream (KENALOG) 0.1 % Apply 1 application topically 2 (two) times daily.     . Turmeric Curcumin 500 MG CAPS Take 1 capsule by mouth daily.      No current facility-administered medications for this visit.     Review of Systems  Constitutional: Positive for malaise/fatigue (mild). Negative for chills, diaphoresis, fever and weight loss (stable).       No new concerns.  HENT: Negative for congestion, ear  discharge, ear pain, nosebleeds, sinus pain, sore throat and tinnitus.        Sublingual growth- better.  Eyes: Negative.  Negative for double vision, photophobia, pain, discharge and redness.  Respiratory: Negative for cough, hemoptysis, sputum production, shortness of breath and wheezing.   Cardiovascular: Negative.  Negative for chest pain, palpitations, orthopnea, leg swelling and PND.  Gastrointestinal: Negative.  Negative for abdominal pain, blood in stool, constipation, diarrhea, heartburn, melena, nausea and vomiting.       No abdominal discomfort.  Genitourinary: Negative.  Negative for dysuria, frequency, hematuria and urgency.  Musculoskeletal: Positive for joint pain. Negative for back pain, falls, myalgias and neck pain.  Skin: Negative.  Negative for itching and rash.  Neurological: Positive for tremors (essential). Negative for dizziness, sensory change, speech change, focal weakness, weakness and headaches.  Psychiatric/Behavioral: Negative.  Negative for depression and memory loss. The patient is not nervous/anxious and does not have insomnia.   All other systems reviewed and are negative.  Performance status (ECOG): 1  Vital Signs BP (!) 145/77 (BP Location: Left Arm, Patient Position: Sitting)   Pulse 74   Temp 98.4 F (36.9 C) (Tympanic)   Resp 12   Ht _0  (1.651 m)   Wt 111 lb 11.2 oz (50.7 kg)   SpO2 95%   BMI 18.59 kg/m   Physical Exam  Constitutional: She is oriented to person, place, and time and well-developed, well-nourished, and in no distress. No distress.  HENT:  Head: Normocephalic and atraumatic.  Curly gray hair.  Eyes: Conjunctivae and EOM are normal. Right eye exhibits no discharge. Left eye exhibits no discharge. No scleral icterus.  Glasses.  Blue.  Neurological: She is alert and oriented to person, place, and time. Gait normal.  Skin: She is not diaphoretic.  Psychiatric: Mood, affect and judgment normal.  Nursing note and vitals  reviewed.       Imaging studies: 09/02/2013:  Chest, abdomen, and pelvic CT revealed no significant interval change. She has some mildly enlarged intra-aortic caval retroperitoneal nodes measuring 1.6 x 1.2 cm. There were stable subcentimeter retroperitoneal nodes. 09/07/2014:  Chest, abdomen, and pelvic CT revealed no evidence of lymphoma recurrence.   09/26/2015:  Chest, abdomen, and pelvic CT revealed stable portacaval (2.5 cm) and aortocaval (1.0 cm) lymph nodes and mild hepatomegaly.   10/29/2017:  Chest, abdomen, and pelvic CT revealed an enlarged mass in the porta hepatis at site of prior presumed lymph node. Differential would include isolated recurrent lymphadenopathy from non-Hodgkin's lymphoma versus gastrointestinal stromal tumor. Lesion was 5.8 x 4.9 cm (compared to 2.5 x 3.3 cm on 09/26/2015).  This lesion has been present but smaller at this  site since 2015.  It was not present on PET-CT scan of 2009.  There was no additional metastatic disease evident in the chest, abdomen or pelvis. 04/01/2018:  Chest, abdomen, and pelvic CT  revealed roughly stable size and appearance of portacaval lymphadenopathy, which was compatible with residual disease.  Nodal mass measured 5.8 x 4.8 cm (previously 5.9 x 5.0 cm).  Note made of mild colonic diverticulosis without evidence of acute diverticulitis, numerous small densely calcified areas in the uterus that likely represent tiny calcified fibroids, and severe calcifications of the mitral annulus. 10/29/2018:  Chest, abdomen, and pelvic CT  revealed slight interval increase in size of the large right retroperitoneal mass (7.1 x 5.8 cm; previously 5.7 x 5.1 cm) generating mass-effect on the IVC and right renal vein. There was possibly a small adjacent aortocaval lymph node that was stable.  There was no other lymphadenopathy in the chest, abdomen, or pelvis. 11/08/2018:  PET scan revealed a 5.5 x 7.3 cm hypermetabolic (SUV 35.0) right retroperitoneal  nodal mass and aortocaval lymph node.  The liver appeared enlarged.   Hospital Outpatient Visit on 11/08/2018  Component Date Value Ref Range Status  . Glucose-Capillary 11/08/2018 84  70 - 99 mg/dL Final    Assessment:  [name] is a 84 y.o. female with a history of stage IIIA follicular non-Hodgkin's lymphoma.  She presented in the summer of 2003 with an epigastric and left upper quadrant mass and left arm weakness with decreased mobility. Abdominal ultrasound revealed a large mass in the head of the pancreas with  multiple enlarged lymph nodes and a large lobulated mass in the right retroperitoneal area.  MRI of the left brachial plexus revealed a 2.5 cm soft tissue mass and abnormal lymph node along the left brachial plexus and in the supraclavicular region.  Cervical node biopsy on 08/06/2002 revealed a grade I follicular non-Hodgkin's lymphoma.  Bone marrow on 08/09/2002 was negative.  Lumbar puncture was negative.  She received Leukeran and Rituxan. As her arm pain and mobility was not relieved, she received local radiation to the left brachial plexus area. Leukeran continued through 02/28/2013 (discontinued secondary side effects).  She developed progressive disease in 10/2003. Patient received CVP chemotherapy beginning 01/02/2004. In 06/2004 she was switched to CNOP.  She received 8 cycles completing on 01/15/2005.  She began maintenance Rituxan on 04/23/2004 (4 weekly doses every 3 months for 2 years). Therapy completed on 09/13/2008.    Excision of a 1 cm right preauricular mass on 10/04/2017 revealed a low grade follicular lymphoma.  CT guided biopsy of the retroperitoneal nodal mass on 11/20/2017 revealed grade I follicular lymphoma.  Immunohistochemistry revealed positive for CD10, BCL-2, BCL-6, Ki67 (15-20%) and negative for cyclin D1 and CD5.  Flow cytometry revealed a CD10+ clonal B-cell population.   Chest, abdomen, and pelvic CT on 10/29/2017 revealed an enlarged mass in  the porta hepatis at site of prior presumed lymph node. Differential would include isolated recurrent lymphadenopathy from non-Hodgkin's lymphoma versus gastrointestinal stromal tumor. Lesion was 5.8 x 4.9 cm (compared to 2.5 x 3.3 cm on 09/26/2015).   Chest, abdomen, and pelvic CT on 04/01/2018 revealed roughly stable size and appearance of portacaval lymphadenopathy, which was compatible with residual disease.  Nodal mass measured 5.8 x 4.8 cm (previously 5.9 x 5.0 cm on 10/29/2017).   Chest, abdomen, and pelvic CT on 10/29/2018 revealed slight interval increase in size of the large right retroperitoneal mass (7.1 x 5.8 cm; previously 5.7 x 5.1 cm) generating  mass-effect on the IVC and right renal vein. There was possibly a small adjacent aortocaval lymph node that was stable.  There was no other lymphadenopathy in the chest, abdomen, or pelvis.  PET scan on 11/08/2018 revealed a 5.5 x 7.3 cm hypermetabolic (SUV 92.4) right retroperitoneal nodal mass and aortocaval lymph node.  The liver appeared enlarged.  She received 4 weekly cycles of Rituxan (12/24/2017 - 01/14/2018).  She is s/p 3 cycles of maintenance Rituxan (03/24/2018 - 10/08/2018).  The following studies were negative on 10/23/2017:  hepatitis B core antibody, hepatitis B surface antigen, hepatitis C antibody.  G6PD assay was normal.  She was admitted to [facility] from 08/15/2015 - 08/25/2015 with bloody diarrhea then hemolytic uremic syndrome related to E. coli 0157.  She had a complicated course with acute renal failure and anemia.  She was then in rehab for 3 weeks.  She continues to work on her balance.  Bone density on 10/27/2017 revealed osteoporosis with a T-score of -3.8 in the left forearm radius.  She began every 6 month Prolia on 04/20/2018 (last 10/21/2018).  Symptomatically, she denies any new complaints.  She denies any B symptoms.  Her tongue lesion is better.  Plan: 1. Stage IIIa follicular non-Hodgkin's lymphoma:  Discuss  PET scan.  Images personally reviewed.  Agree with radiology.  PET scan SUV c/w either low grade lymphoma or transformed lymphoma.  Discuss consideration of biopsy.  Discuss treatment options based on biopsy.  If still low grade, consider Revlimid +/- Rituxan, Bendamustine + obinutuzimab, ibritumomab tiuxetan, or PI3K inhibitor.  Contact Dr. Jeanmarie Plant regarding possible clinical trial. 2. Oral lesion: Sublingual lesion improving per patient report. Continue to monitor. 3.  Refer to Jeanmarie Plant at [facility]. 4.  RTC based on [facility] discussion.  Addendum:  Dr Glean Salen agrees with repeat biopsy.  RTC after biopsy.   Honor Loh, NP  11/08/2018, 3:22 PM   I saw and evaluated the patient, participating in the key portions of the service and reviewing pertinent diagnostic studies and records.  I reviewed the nurse practitioner's note and agree with the findings and the plan.  The assessment and plan were discussed with the patient.  Several questions were asked by the patient and answered.   Nolon Stalls, MD 11/08/2018,3:22 PM

## 2018-11-08 NOTE — Progress Notes (Signed)
Patient here for follow up

## 2018-11-15 ENCOUNTER — Encounter: Payer: Self-pay | Admitting: Hematology and Oncology

## 2018-11-16 ENCOUNTER — Ambulatory Visit: Payer: Medicare Other | Admitting: Hematology and Oncology

## 2018-11-17 ENCOUNTER — Other Ambulatory Visit: Payer: Self-pay | Admitting: Urgent Care

## 2018-11-17 DIAGNOSIS — R948 Abnormal results of function studies of other organs and systems: Secondary | ICD-10-CM

## 2018-11-17 DIAGNOSIS — C8290 Follicular lymphoma, unspecified, unspecified site: Secondary | ICD-10-CM

## 2018-11-18 ENCOUNTER — Telehealth: Payer: Self-pay

## 2018-11-18 ENCOUNTER — Encounter: Payer: Self-pay | Admitting: Urgent Care

## 2018-11-18 NOTE — Telephone Encounter (Signed)
Spoke with the patient to confirm her appointment time and date for Bx on 12/10 need to arrive at 11:00 for a 12:00 pm appointment. The patient was agreeable and understanding to keep schedule appointment.

## 2018-11-23 ENCOUNTER — Other Ambulatory Visit: Payer: Self-pay

## 2018-11-23 ENCOUNTER — Ambulatory Visit
Admission: RE | Admit: 2018-11-23 | Discharge: 2018-11-23 | Disposition: A | Discharge status: Home / Self Care | Payer: Medicare Other | Source: Ambulatory Visit | Attending: Urgent Care | Admitting: Urgent Care

## 2018-11-23 DIAGNOSIS — R948 Abnormal results of function studies of other organs and systems: Secondary | ICD-10-CM | POA: Diagnosis present

## 2018-11-23 DIAGNOSIS — Z888 Allergy status to other drugs, medicaments and biological substances status: Secondary | ICD-10-CM | POA: Insufficient documentation

## 2018-11-23 DIAGNOSIS — Z8673 Personal history of transient ischemic attack (TIA), and cerebral infarction without residual deficits: Secondary | ICD-10-CM | POA: Diagnosis not present

## 2018-11-23 DIAGNOSIS — Z9889 Other specified postprocedural states: Secondary | ICD-10-CM | POA: Insufficient documentation

## 2018-11-23 DIAGNOSIS — I700 Atherosclerosis of aorta: Secondary | ICD-10-CM | POA: Insufficient documentation

## 2018-11-23 DIAGNOSIS — I10 Essential (primary) hypertension: Secondary | ICD-10-CM | POA: Insufficient documentation

## 2018-11-23 DIAGNOSIS — R590 Localized enlarged lymph nodes: Secondary | ICD-10-CM | POA: Insufficient documentation

## 2018-11-23 DIAGNOSIS — D593 Hemolytic-uremic syndrome: Secondary | ICD-10-CM | POA: Diagnosis not present

## 2018-11-23 DIAGNOSIS — C8290 Follicular lymphoma, unspecified, unspecified site: Secondary | ICD-10-CM | POA: Diagnosis not present

## 2018-11-23 DIAGNOSIS — R1909 Other intra-abdominal and pelvic swelling, mass and lump: Secondary | ICD-10-CM | POA: Insufficient documentation

## 2018-11-23 DIAGNOSIS — R002 Palpitations: Secondary | ICD-10-CM | POA: Insufficient documentation

## 2018-11-23 DIAGNOSIS — M810 Age-related osteoporosis without current pathological fracture: Secondary | ICD-10-CM | POA: Diagnosis not present

## 2018-11-23 DIAGNOSIS — Z79899 Other long term (current) drug therapy: Secondary | ICD-10-CM | POA: Insufficient documentation

## 2018-11-23 LAB — CBC
HCT: 35.1 % — ABNORMAL LOW (ref 36.0–46.0)
HEMOGLOBIN: 11.5 g/dL — AB (ref 12.0–15.0)
MCH: 32.1 pg (ref 26.0–34.0)
MCHC: 32.8 g/dL (ref 30.0–36.0)
MCV: 98 fL (ref 80.0–100.0)
NRBC: 0 % (ref 0.0–0.2)
Platelets: 276 10*3/uL (ref 150–400)
RBC: 3.58 MIL/uL — AB (ref 3.87–5.11)
RDW: 13.6 % (ref 11.5–15.5)
WBC: 8.1 10*3/uL (ref 4.0–10.5)

## 2018-11-23 LAB — PROTIME-INR
INR: 0.9
Prothrombin Time: 12.1 seconds (ref 11.4–15.2)

## 2018-11-23 LAB — APTT: aPTT: 28 seconds (ref 24–36)

## 2018-11-23 MED ORDER — MIDAZOLAM HCL 5 MG/5ML IJ SOLN
INTRAMUSCULAR | Status: AC
  Filled 2018-11-23: qty 5

## 2018-11-23 MED ORDER — MIDAZOLAM HCL 2 MG/2ML IJ SOLN
INTRAMUSCULAR | Status: AC | PRN
  Administered 2018-11-23: 1 mg via INTRAVENOUS

## 2018-11-23 MED ORDER — FENTANYL CITRATE (PF) 100 MCG/2ML IJ SOLN
INTRAMUSCULAR | Status: AC | PRN
  Administered 2018-11-23: 50 ug via INTRAVENOUS

## 2018-11-23 MED ORDER — SODIUM CHLORIDE 0.9 % IV SOLN
INTRAVENOUS | Status: DC
  Administered 2018-11-23: 12:00:00 via INTRAVENOUS

## 2018-11-23 MED ORDER — FENTANYL CITRATE (PF) 100 MCG/2ML IJ SOLN
INTRAMUSCULAR | Status: AC
  Filled 2018-11-23: qty 2

## 2018-11-23 NOTE — Consult Note (Signed)
Chief Complaint: Hypermetabolic retroperitoneal lymphadenopathy.  Referring Physician(s): Corcoran  Patient Status: [facility] - Out-pt  History of Present Illness: [name] is a 84 y.o. female with past medical history significant for Muniz disease, hemolytic uremic syndrome, hypertension and follicular lymphoma now with large hypermetabolic right-sided retroperitoneal mass.  Patient presents today for CT-guided retroperitoneal mass biopsy.  Patient is accompanied by her husband though serves as her own historian.  Patient is at her baseline state of health and is currently without complaint.  Specifically, no change in appetite or energy level.  No abdominal pain.  No chest pain, shortness of breath, fever or chills.   Past Medical History:  Diagnosis Date  . Cancer [facility]) 2013   Non-Hodgkin Lymphoma with chemo and rad tx  . Follicular lymphoma grade I ([place]) 08/06/2002  . Hemolytic uremic syndrome ([place]) 08/13/2015   (E coli 0157)  . Hypertension   . Meniere's disease    with bilateral hearing loss  . Mini stroke [facility])    summer 2014  . Osteoporosis     Past Surgical History:  Procedure Laterality Date  . BONE MARROW BIOPSY  08/09/2002  . BREAST LUMPECTOMY  1950s   benign  . CATARACT EXTRACTION  03/2009  . LYMPH NODE BIOPSY  08/07/2002   cervical  . TUBAL LIGATION      Allergies: Lisinopril  Medications: Prior to Admission medications   Medication Sig Start Date End Date Taking? Authorizing Provider  Acetaminophen (TYLENOL ARTHRITIS EXT RELIEF PO) Take 1 tablet by mouth daily as needed (pain).   Yes [provider]  allopurinol (ZYLOPRIM) 300 MG tablet TAKE ONE TABLET BY MOUTH ONE TIME DAILY  09/13/18  Yes Karen Kitchens, NP  AZELASTINE & FLUTICASONE NA 1 spray each nase twice daily.   Yes [provider]  calcium-vitamin D (CALCIUM 500/D) 500-200 MG-UNIT tablet Take 1 tablet by mouth 2 (two) times daily.    Yes [provider]    Flax Oil-Fish Oil-Borage Oil (FISH OIL-FLAX OIL-BORAGE OIL) CAPS Take 1,000-1,400 mg by mouth daily.   Yes [provider]  Multiple Vitamin (MULTI-VITAMINS) TABS Take 1 tablet by mouth daily.    Yes [provider]  Multiple Vitamins-Minerals (PRESERVISION AREDS 2 PO) Take 1 tablet by mouth daily.    Yes [provider]  omeprazole (PRILOSEC) 40 MG capsule Take 40 mg by mouth daily.   Yes [provider]  Probiotic Product (PROBIOTIC DAILY PO) Take 2 tablets by mouth daily.    Yes [provider]  traZODone (DESYREL) 50 MG tablet TAKE 1 TO 2 TABLETS BY MOUTH DAILY AT BEDTIME AS NEEDED 05/30/15 10/02/37 Yes [provider]  triamcinolone cream (KENALOG) 0.1 % Apply 1 application topically 2 (two) times daily.    Yes [provider]  Turmeric Curcumin 500 MG CAPS Take 1 capsule by mouth daily.    Yes [provider]  denosumab (PROLIA) 60 MG/ML SOSY injection Inject 60 mg into the skin every 6 (six) months.     [provider]     Family History  Problem Relation Age of Onset  . Cancer Father        Throat  . Cancer Sister 63       Colon    Social History   Socioeconomic History  . Marital status: Married    Spouse name: Not on file  . Number of children: Not on file  . Years of education: Not on file  . Highest  education level: Not on file  Occupational History  . Not on file  Social Needs  . Financial resource strain: Not on file  . Food insecurity:    Worry: Not on file    Inability: Not on file  . Transportation needs:    Medical: Not on file    Non-medical: Not on file  Tobacco Use  . Smoking status: Never Smoker  . Smokeless tobacco: Never Used  Substance and Sexual Activity  . Alcohol use: No    Alcohol/week: 0.0 standard drinks  . Drug use: No  . Sexual activity: Not on file  Lifestyle  . Physical activity:    Days per week: Not on file    Minutes per session: Not on file  .  Stress: Not on file  Relationships  . Social connections:    Talks on phone: Not on file    Gets together: Not on file    Attends religious service: Not on file    Active member of club or organization: Not on file    Attends meetings of clubs or organizations: Not on file    Relationship status: Not on file  Other Topics Concern  . Not on file  Social History Narrative  . Not on file    ECOG Status: 0 - Asymptomatic  Review of Systems: A 12 point ROS discussed and pertinent positives are indicated in the HPI above.  All other systems are negative.  Review of Systems  Constitutional: Negative for activity change and appetite change.  Respiratory: Negative.   Cardiovascular: Negative.   Gastrointestinal: Negative.  Negative for abdominal pain.    Vital Signs: BP (!) 165/74   Pulse 72   Temp 98.3 F (36.8 C) (Oral)   Resp 18   Ht 5' 5" (1.651 m)   Wt 49 kg   SpO2 99%   BMI 17.97 kg/m   Physical Exam  Constitutional: She appears well-developed and well-nourished.  HENT:  Head: Atraumatic.  Cardiovascular: Normal rate and regular rhythm.  Pulmonary/Chest: Effort normal and breath sounds normal.  Psychiatric: She has a normal mood and affect. Her behavior is normal.  Nursing note and vitals reviewed.   Imaging: Ct Chest W Contrast  Result Date: 10/29/2018 CLINICAL DATA:  Follicular lymphoma EXAM: CT CHEST, ABDOMEN, AND PELVIS WITH CONTRAST TECHNIQUE: Multidetector CT imaging of the chest, abdomen and pelvis was performed following the standard protocol during bolus administration of intravenous contrast. CONTRAST:  39m ISOVUE-300 IOPAMIDOL (ISOVUE-300) INJECTION 61% COMPARISON:  For [id number]: CT CHEST FINDINGS Cardiovascular: The heart size is normal. No substantial pericardial effusion. No thoracic aortic aneurysm. Pulmonary arterial enlargement suggests pulmonary arterial hypertension. Mediastinum/Nodes: No mediastinal lymphadenopathy. There is no hilar  lymphadenopathy. The esophagus has normal imaging features. There is no axillary lymphadenopathy. Lungs/Pleura: Stable biapical pleuroparenchymal scarring, left greater than right. No suspicious pulmonary nodule or mass. No pleural effusion. Musculoskeletal: No worrisome lytic or sclerotic osseous abnormality. CT ABDOMEN PELVIS FINDINGS Hepatobiliary: No focal abnormality within the liver parenchyma. There is no evidence for gallstones, gallbladder wall thickening, or pericholecystic fluid. No intrahepatic or extrahepatic biliary dilation. Pancreas: No focal mass lesion. No dilatation of the main duct. No intraparenchymal cyst. No peripancreatic edema. Spleen: Calcified granulomata noted within the spleen. Adrenals/Urinary Tract: No adrenal nodule or mass. There is probably some thickening of the left adrenal gland. Kidneys unremarkable. No evidence for hydroureter. The urinary bladder appears normal for the degree of distention. Stomach/Bowel: Stomach is nondistended. No gastric wall thickening. No evidence  of outlet obstruction. Duodenum is normally positioned as is the ligament of Treitz. No small bowel wall thickening. No small bowel dilatation. The terminal ileum is normal. The appendix is not visualized, but there is no edema or inflammation in the region of the cecum. No gross colonic mass. No colonic wall thickening. Vascular/Lymphatic: There is abdominal aortic atherosclerosis without aneurysm. Right retroperitoneal mass measures 7.1 x 5.8 cm on today's study which compares to 5.7 x 5.1 cm when I remeasure in a similar fashion on the previous exam. This lesion generates substantial mass-effect on the IVC and right renal vein. There appears to be a smaller adjacent lymph node in the para caval space. No pelvic sidewall lymphadenopathy. 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 Reproductive: The uterus has normal CT imaging appearance. There is no adnexal mass. Other: Trace free  fluid noted in the pelvis Musculoskeletal: No worrisome lytic or sclerotic osseous abnormality. IMPRESSION: 1. Slight interval increase in size of the large right retroperitoneal mass generating mass-effect on the IVC and right renal vein. There may be a small adjacent aortocaval lymph node and this is also stable. 2. No other lymphadenopathy in the chest, abdomen, or pelvis. 3. Pulmonary arterial enlargement suggests pulmonary arterial hypertension. 4.  Aortic Atherosclerois (ICD10-170.0) Electronically Signed   By: Misty Stanley M.D.   On: 10/29/2018 20:43   Ct Abdomen Pelvis W Contrast  Result Date: 10/29/2018 CLINICAL DATA:  Follicular lymphoma EXAM: CT CHEST, ABDOMEN, AND PELVIS WITH CONTRAST TECHNIQUE: Multidetector CT imaging of the chest, abdomen and pelvis was performed following the standard protocol during bolus administration of intravenous contrast. CONTRAST:  41m ISOVUE-300 IOPAMIDOL (ISOVUE-300) INJECTION 61% COMPARISON:  For [id number]: CT CHEST FINDINGS Cardiovascular: The heart size is normal. No substantial pericardial effusion. No thoracic aortic aneurysm. Pulmonary arterial enlargement suggests pulmonary arterial hypertension. Mediastinum/Nodes: No mediastinal lymphadenopathy. There is no hilar lymphadenopathy. The esophagus has normal imaging features. There is no axillary lymphadenopathy. Lungs/Pleura: Stable biapical pleuroparenchymal scarring, left greater than right. No suspicious pulmonary nodule or mass. No pleural effusion. Musculoskeletal: No worrisome lytic or sclerotic osseous abnormality. CT ABDOMEN PELVIS FINDINGS Hepatobiliary: No focal abnormality within the liver parenchyma. There is no evidence for gallstones, gallbladder wall thickening, or pericholecystic fluid. No intrahepatic or extrahepatic biliary dilation. Pancreas: No focal mass lesion. No dilatation of the main duct. No intraparenchymal cyst. No peripancreatic edema. Spleen: Calcified granulomata noted within  the spleen. Adrenals/Urinary Tract: No adrenal nodule or mass. There is probably some thickening of the left adrenal gland. Kidneys unremarkable. No evidence for hydroureter. The urinary bladder appears normal for the degree of distention. Stomach/Bowel: Stomach is nondistended. No gastric wall thickening. No evidence of outlet obstruction. Duodenum is normally positioned as is the ligament of Treitz. No small bowel wall thickening. No small bowel dilatation. The terminal ileum is normal. The appendix is not visualized, but there is no edema or inflammation in the region of the cecum. No gross colonic mass. No colonic wall thickening. Vascular/Lymphatic: There is abdominal aortic atherosclerosis without aneurysm. Right retroperitoneal mass measures 7.1 x 5.8 cm on today's study which compares to 5.7 x 5.1 cm when I remeasure in a similar fashion on the previous exam. This lesion generates substantial mass-effect on the IVC and right renal vein. There appears to be a smaller adjacent lymph node in the para caval space. No pelvic sidewall lymphadenopathy. 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 Reproductive: The uterus has  normal CT imaging appearance. There is no adnexal mass. Other: Trace free fluid noted in the pelvis Musculoskeletal: No worrisome lytic or sclerotic osseous abnormality. IMPRESSION: 1. Slight interval increase in size of the large right retroperitoneal mass generating mass-effect on the IVC and right renal vein. There may be a small adjacent aortocaval lymph node and this is also stable. 2. No other lymphadenopathy in the chest, abdomen, or pelvis. 3. Pulmonary arterial enlargement suggests pulmonary arterial hypertension. 4.  Aortic Atherosclerois (ICD10-170.0) Electronically Signed   By: Misty Stanley M.D.   On: 10/29/2018 20:43   Nm Pet Image Restag (ps) Skull Base To Thigh  Result Date: 11/08/2018 CLINICAL DATA:  Subsequent treatment strategy for follicular  lymphoma. EXAM: NUCLEAR MEDICINE PET SKULL BASE TO THIGH TECHNIQUE: 6.1 mCi F-18 FDG was injected intravenously. Full-ring PET imaging was performed from the skull base to thigh after the radiotracer. CT data was obtained and used for attenuation correction and anatomic localization. Fasting blood glucose: 84 mg/dl COMPARISON:  CT chest abdomen pelvis 10/29/2018. FINDINGS: Mediastinal blood pool activity: SUV max 2.0 NECK: No hypermetabolic lymph nodes. Incidental CT findings: Small amount of fluid in the right maxillary sinus. CHEST: No hypermetabolic mediastinal, hilar or axillary lymph nodes. No hypermetabolic pulmonary nodules. Incidental CT findings: Heart is at the upper limits of normal in size. No pericardial or pleural effusion. Scarring in the apical left upper lobe. ABDOMEN/PELVIS: Right-sided retroperitoneal mass measures 5.5 x 7.3 cm with an SUV max 10.0. Adjacent hypermetabolic aortocaval lymph node. No additional hypermetabolic lymph nodes in the abdomen or pelvis. No abnormal hypermetabolism in the liver, adrenal glands, spleen or pancreas. Incidental CT findings: Liver is enlarged. Gallbladder and adrenal glands are unremarkable. Slight mass effect on the right kidney by the above-described retroperitoneal mass. Kidneys, spleen, pancreas, stomach and bowel are otherwise grossly unremarkable. Atherosclerotic calcification of the arterial vasculature. SKELETON: No abnormal osseous hypermetabolism. Incidental CT findings: None. IMPRESSION: 1. Hypermetabolic right retroperitoneal nodal mass and aortocaval lymph node. 2. Liver appears enlarged. 3.  Aortic atherosclerosis (ICD10-170.0). Electronically Signed   By: Lorin Picket M.D.   On: 11/08/2018 13:33    Labs:  CBC: Recent Labs    04/08/18 0812 07/08/18 0841 10/08/18 0858 11/23/18 1115  WBC 6.4 5.9 6.1 8.1  HGB 12.4 13.7 12.4 11.5*  HCT 36.0 40.4 36.3 35.1*  PLT 226 256 256 276    COAGS: Recent Labs    11/23/18 1115  INR 0.90    APTT 28    BMP: Recent Labs    04/20/18 1441 07/08/18 0841 10/08/18 0858 10/21/18 1052  NA 132* 138 137 137  K 4.1 3.9 3.9 4.1  CL 98* 103 105 103  CO2 _0 GLUCOSE 109* 96 99 94  BUN 23* 25* 21 20  CALCIUM 8.9 9.0 9.3 9.4  CREATININE 0.79 0.66 0.48 0.50  GFRNONAA >60 >60 >60 >60  GFRAA >60 >60 >60 >60    LIVER FUNCTION TESTS: Recent Labs    01/07/18 0854 04/08/18 0812 07/08/18 0841 10/08/18 0858  BILITOT 0.6 0.5 0.9 1.0  AST _1 ALT _2 ALKPHOS 98 89 68 70  PROT 6.0* 5.9* 6.2* 6.2*  ALBUMIN 3.9 3.9 4.1 4.1    TUMOR MARKERS: No results for input(s): AFPTM, CEA, CA199, CHROMGRNA in the last 8760 hours.  Assessment and Plan:  HENSLEY AZIZ is a 84 y.o. female with past medical history significant for Muniz disease, hemolytic uremic syndrome, hypertension  and follicular lymphoma now with large hypermetabolic right-sided retroperitoneal mass.  Patient presents today for CT-guided retroperitoneal mass biopsy.    Risks and benefits CT-guided retroperitoneal lymph node biopsy discussed with the patient including, but not limited to bleeding, infection, damage to adjacent structures or low yield requiring additional tests.  All of the patient's questions were answered, patient is agreeable to proceed.  Consent signed and in chart.  Thank you for this interesting consult.  I greatly enjoyed meeting [name] and look forward to participating in their care.  A copy of this report was sent to the requesting provider on this date.  Electronically Signed: Sandi Mariscal, MD 11/23/2018, 11:43 AM   I spent a total of 15 Minutes in face to face in clinical consultation, greater than 50% of which was counseling/coordinating care for CT guided retroperitoneal lymph node biopsy

## 2018-11-23 NOTE — Procedures (Signed)
Pre procedural Dx: Retroperitoneal lymphadenopathy  Post procedural Dx: Same  Technically successful CT guided biopsy of dominant hypermetabolic right sided retroperitoneal lymph node.    EBL: None.   Complications: None immediate.   Ronny Bacon, MD Pager #: [phone]

## 2018-11-25 ENCOUNTER — Encounter: Payer: Self-pay | Admitting: Hematology and Oncology

## 2018-11-25 LAB — SURGICAL PATHOLOGY

## 2018-11-29 ENCOUNTER — Encounter: Payer: Self-pay | Admitting: Urgent Care

## 2018-12-01 ENCOUNTER — Other Ambulatory Visit: Payer: Self-pay | Admitting: Hematology and Oncology

## 2018-12-01 DIAGNOSIS — C8200 Follicular lymphoma grade I, unspecified site: Secondary | ICD-10-CM

## 2018-12-07 ENCOUNTER — Inpatient Hospital Stay: Payer: Medicare Other | Attending: Hematology and Oncology | Admitting: Hematology and Oncology

## 2018-12-07 ENCOUNTER — Encounter: Payer: Self-pay | Admitting: Hematology and Oncology

## 2018-12-07 VITALS — BP 143/78 | HR 76 | Temp 97.7°F | Resp 18 | Wt 112.6 lb

## 2018-12-07 DIAGNOSIS — I10 Essential (primary) hypertension: Secondary | ICD-10-CM | POA: Insufficient documentation

## 2018-12-07 DIAGNOSIS — C8220 Follicular lymphoma grade III, unspecified, unspecified site: Secondary | ICD-10-CM | POA: Diagnosis present

## 2018-12-07 DIAGNOSIS — C8200 Follicular lymphoma grade I, unspecified site: Secondary | ICD-10-CM

## 2018-12-07 DIAGNOSIS — Z79899 Other long term (current) drug therapy: Secondary | ICD-10-CM | POA: Diagnosis not present

## 2018-12-07 NOTE — Progress Notes (Signed)
Patient offers no complaints today. 

## 2018-12-07 NOTE — Progress Notes (Signed)
[facility] day:  12/07/2018   Chief Complaint: [name] is a 84 y.o. female with a history of stage IIIA follicular non-Hodgkin's lymphoma who is seen for review of interval retroperitoneal biopsy and discussion regarding direction of therapy.  HPI: The patient was last seen in the medical oncology clinic on 11/08/2018.  At that time, she was doing well.  PET scan on 11/08/2018 had revealed a 5.5 x 7.3 cm hypermetabolic (SUV 38.1) right retroperitoneal nodal mass and aortocaval lymph node.  As it was unsure if the mass represented a persistent low grade follicular lymphoma or had transformed, we discussed biopsy.  She underwent CT guided biopsy on 11/23/2018.  Pathology confirmed follicular lymphoma, grade I.  During the interim, patient is starting to feel better following her biopsy. She notes that she felt poorly for a week following her biopsy. She has no acute concerns at the present time. Patient denies that she has experienced any B symptoms. She denies any interval infections. She has not appreciated any new areas of palpable adenopathy.   Patient advises that she maintains an adequate appetite. She is eating well. Weight today is 112 lb 9 oz (51.1 kg), which compared to her last visit to the clinic, represents a 1 pound increase.   Patient denies pain in the clinic today.   Past Medical History:  Diagnosis Date  . Cancer [facility]) 2013   Non-Hodgkin Lymphoma with chemo and rad tx  . Follicular lymphoma grade I ([place]) 08/06/2002  . 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 Hypertension   . Meniere's disease    with bilateral hearing loss  . Mini stroke [facility])    summer 2014  . Osteoporosis     Past Surgical History:  Procedure Laterality Date  . BONE MARROW BIOPSY  08/09/2002  . BREAST LUMPECTOMY  1950s   benign  . CATARACT EXTRACTION  03/2009  . LYMPH NODE BIOPSY  08/07/2002   cervical  . TUBAL  LIGATION      Family History  Problem Relation Age of Onset  . Cancer Father        Throat  . Cancer Sister 77       Colon    Social History:  reports that she has never smoked. She has never used smokeless tobacco. She reports that she does not drink alcohol or use drugs.  She lives in [place].  The patient is accompanied by her husband today.  Allergies:  Allergies  Allergen Reactions  . Lisinopril Cough    Current Medications: Current Outpatient Medications  Medication Sig Dispense Refill  . Acetaminophen (TYLENOL ARTHRITIS EXT RELIEF PO) Take 1 tablet by mouth daily as needed (pain).    [name] allopurinol (ZYLOPRIM) 300 MG tablet TAKE ONE TABLET BY MOUTH ONE TIME DAILY  90 tablet 0  . AZELASTINE & FLUTICASONE NA 1 spray each nase twice daily.    . calcium-vitamin D (CALCIUM 500/D) 500-200 MG-UNIT tablet Take 1 tablet by mouth 2 (two) times daily.     [name] denosumab (PROLIA) 60 MG/ML SOSY injection Inject 60 mg into the skin every 6 (six) months.     . Flax Oil-Fish Oil-Borage Oil (FISH OIL-FLAX OIL-BORAGE OIL) CAPS Take 1,000-1,400 mg by mouth daily.    . Multiple Vitamin (MULTI-VITAMINS) TABS Take 1 tablet by mouth daily.     . Multiple Vitamins-Minerals (PRESERVISION AREDS 2 PO) Take 1 tablet by mouth daily.     [name]  omeprazole (PRILOSEC) 40 MG capsule Take 40 mg by mouth daily.    . Probiotic Product (PROBIOTIC DAILY PO) Take 2 tablets by mouth daily.     . traZODone (DESYREL) 50 MG tablet TAKE 1 TO 2 TABLETS BY MOUTH DAILY AT BEDTIME AS NEEDED    . triamcinolone cream (KENALOG) 0.1 % Apply 1 application topically 2 (two) times daily.     . Turmeric Curcumin 500 MG CAPS Take 1 capsule by mouth daily.      No current facility-administered medications for this visit.     Review of Systems  Constitutional: Negative.  Negative for chills, diaphoresis, fever, malaise/fatigue and weight loss (up 1 pound).       Feels "back to normal".  HENT: Negative.  Negative for congestion, ear  discharge, ear pain, nosebleeds, sinus pain and sore throat.   Eyes: Negative.  Negative for double vision, photophobia, pain, discharge and redness.  Respiratory: Negative.  Negative for cough, hemoptysis, sputum production and shortness of breath.   Cardiovascular: Negative.  Negative for chest pain, palpitations, orthopnea, leg swelling and PND.  Gastrointestinal: Negative for abdominal pain, blood in stool, constipation, diarrhea, heartburn, melena, nausea and vomiting.       Intermittent abdominal discomfort.  Genitourinary: Negative.  Negative for dysuria, frequency, hematuria and urgency.  Musculoskeletal: Negative.  Negative for back pain, falls, joint pain, myalgias and neck pain.  Skin: Negative.  Negative for itching and rash.  Neurological: Positive for tremors (essential). Negative for dizziness, tingling, sensory change, speech change, focal weakness, weakness and headaches.  Endo/Heme/Allergies: Negative.  Does not bruise/bleed easily.  Psychiatric/Behavioral: Negative for depression and memory loss. The patient does not have insomnia.   All other systems reviewed and are negative.  Performance status (ECOG): 1  Vital Signs BP (!) 143/78 (BP Location: Left Arm, Patient Position: Sitting)   Pulse 76   Temp 97.7 F (36.5 C) (Tympanic)   Resp 18   Wt 112 lb 9 oz (51.1 kg)   BMI 18.73 kg/m   Physical Exam  Constitutional: She is oriented to person, place, and time and well-developed, well-nourished, and in no distress. No distress.  HENT:  Head: Normocephalic and atraumatic.  Gray hair.  Eyes: Conjunctivae and EOM are normal. Right eye exhibits no discharge. Left eye exhibits no discharge. No scleral icterus.  Neck: Normal range of motion. Neck supple. No JVD present.  Cardiovascular: Normal rate, regular rhythm, normal heart sounds and intact distal pulses. Exam reveals no gallop and no friction rub.  No murmur heard. Pulmonary/Chest: Effort normal and breath sounds  normal. No respiratory distress. She has no wheezes. She has no rales.  Abdominal: Soft. Bowel sounds are normal. She exhibits no distension and no mass. There is no abdominal tenderness. There is no rebound and no guarding.  Musculoskeletal: Normal range of motion.        General: No tenderness or edema.  Lymphadenopathy:    She has no cervical adenopathy.  Neurological: She is alert and oriented to person, place, and time. Gait normal.  Skin: Skin is warm and dry. No rash noted. She is not diaphoretic. No erythema. No pallor.  Psychiatric: Mood, affect and judgment normal.  Nursing note and vitals reviewed.       Imaging studies: 09/02/2013:  Chest, abdomen, and pelvic CT revealed no significant interval change. She has some mildly enlarged intra-aortic caval retroperitoneal nodes measuring 1.6 x 1.2 cm. There were stable subcentimeter retroperitoneal nodes. 09/07/2014:  Chest, abdomen, and pelvic CT revealed  no evidence of lymphoma recurrence.   09/26/2015:  Chest, abdomen, and pelvic CT revealed stable portacaval (2.5 cm) and aortocaval (1.0 cm) lymph nodes and mild hepatomegaly.   10/29/2017:  Chest, abdomen, and pelvic CT revealed an enlarged mass in the porta hepatis at site of prior presumed lymph node. Differential would include isolated recurrent lymphadenopathy from non-Hodgkin's lymphoma versus gastrointestinal stromal tumor. Lesion was 5.8 x 4.9 cm (compared to 2.5 x 3.3 cm on 09/26/2015).  This lesion has been present but smaller at this site since 2015.  It was not present on PET-CT scan of 2009.  There was no additional metastatic disease evident in the chest, abdomen or pelvis. 04/01/2018:  Chest, abdomen, and pelvic CT  revealed roughly stable size and appearance of portacaval lymphadenopathy, which was compatible with residual disease.  Nodal mass measured 5.8 x 4.8 cm (previously 5.9 x 5.0 cm).  Note made of mild colonic diverticulosis without evidence of acute  diverticulitis, numerous small densely calcified areas in the uterus that likely represent tiny calcified fibroids, and severe calcifications of the mitral annulus. 10/29/2018:  Chest, abdomen, and pelvic CT  revealed slight interval increase in size of the large right retroperitoneal mass (7.1 x 5.8 cm; previously 5.7 x 5.1 cm) generating mass-effect on the IVC and right renal vein. There was possibly a small adjacent aortocaval lymph node that was stable.  There was no other lymphadenopathy in the chest, abdomen, or pelvis. 11/08/2018:  PET scan revealed a 5.5 x 7.3 cm hypermetabolic (SUV 80.9) right retroperitoneal nodal mass and aortocaval lymph node.  The liver appeared enlarged.  No visits with results within 3 Day(s) from this visit.  Latest known visit with results is:  Hospital Outpatient Visit on 11/23/2018  Component Date Value Ref Range Status  . WBC 11/23/2018 8.1  4.0 - 10.5 K/uL Final  . RBC 11/23/2018 3.58* 3.87 - 5.11 MIL/uL Final  . Hemoglobin 11/23/2018 11.5* 12.0 - 15.0 g/dL Final  . HCT 11/23/2018 35.1* 36.0 - 46.0 % Final  . MCV 11/23/2018 98.0  80.0 - 100.0 fL Final  . MCH 11/23/2018 32.1  26.0 - 34.0 pg Final  . MCHC 11/23/2018 32.8  30.0 - 36.0 g/dL Final  . RDW 11/23/2018 13.6  11.5 - 15.5 % Final  . Platelets 11/23/2018 276  150 - 400 K/uL Final  . nRBC 11/23/2018 0.0  0.0 - 0.2 % Final   Performed at [facility], [address] [place], [place] [address]  . aPTT 11/23/2018 28  24 - 36 seconds Final   Performed at [facility], [address] [place], [place] [address]  . Prothrombin Time 11/23/2018 12.1  11.4 - 15.2 seconds Final  . INR 11/23/2018 0.90   Final   Performed at [facility], [address] [place], [place] [address]  . SURGICAL PATHOLOGY 11/23/2018    Final                   Value:Surgical Pathology CASE: [id number] PATIENT: [facility] Surgical Pathology Report     SPECIMEN SUBMITTED: A. Lymph  node, right  CLINICAL HISTORY: History of follicular lymphoma, post CT guided BX of hypermetabolic right sided retroperitoneal nodal mass  PRE-OPERATIVE DIAGNOSIS: None provided  POST-OPERATIVE DIAGNOSIS: None provided.     DIAGNOSIS: A. LYMPH NODE, RETROPERITONEAL, RIGHT; CT-GUIDED BIOPSY: - CONSISTENT WITH FOLLICULAR LYMPHOMA, GRADE 1.   Comment Tissue submitted concurrently to [facility]) for flow cytometry analysis shows a clonal CD10 positive B-cell  population consistent with lymphoma of follicular center cell origin. The morphologic features in the current biopsy specimen are similar to those noted in this patient's previous biopsy ([facility] [phone]) which is reviewed on 11/25/2018. Please see flow cytometry report [employer] [phone].   GROSS DESCRIPTION: A1. Labeled: Retroperitoneal mass Recei                         ved: Fresh Tissue fragment(s): Multiple cores and fragments Size: Aggregate, 3.1 x 0.1 x 0.1 cm Description: Hemorrhagic and tan tissue fragments, touch prep prepared, following touch preparation representative is submitted in RPMI media for flow cytometry, the remaining tissue is formalin fixed Entirely submitted in cassette 1.  A2. Labeled: Retroperitoneal mass Received: In formalin Tissue fragment(s): 3 Size: 0.9 to 2.0 cm in length and in diameter 0.1 cm Description: Pink to red cores Entirely submitted in cassette 2 and 3.   Final Diagnosis performed by Raynelle Bring, MD.   Electronically signed 11/25/2018 10:45:02AM The electronic signature indicates that the named Attending Pathologist has evaluated the specimen  Technical component performed at [facility], [address], [place], [place] [address] Lab: [phone] Dir: Rush Farmer, MD, MMM  Professional component performed at [facility], [facility], [place], [place], [place] Lab: [place]. Reuel Derby, MD     Assessment:  [name] is a 84 y.o. female with a history of stage IIIA follicular non-Hodgkin's lymphoma.  She presented in the summer of 2003 with an epigastric and left upper quadrant mass and left arm weakness with decreased mobility. Abdominal ultrasound revealed a large mass in the head of the pancreas with  multiple enlarged lymph nodes and a large lobulated mass in the right retroperitoneal area.  MRI of the left brachial plexus revealed a 2.5 cm soft tissue mass and abnormal lymph node along the left brachial plexus and in the supraclavicular region.  Cervical node biopsy on 08/06/2002 revealed a grade I follicular non-Hodgkin's lymphoma.  Bone marrow on 08/09/2002 was negative.  Lumbar puncture was negative.  She received Leukeran and Rituxan. As her arm pain and mobility was not relieved, she received local radiation to the left brachial plexus area. Leukeran continued through 02/28/2013 (discontinued secondary side effects).  She developed progressive disease in 10/2003. Patient received CVP chemotherapy beginning 01/02/2004. In 06/2004 she was switched to CNOP.  She received 8 cycles completing on 01/15/2005.  She began maintenance Rituxan on 04/23/2004 (4 weekly doses every 3 months for 2 years). Therapy completed on 09/13/2008.    Excision of a 1 cm right preauricular mass on 10/04/2017 revealed a low grade follicular lymphoma.  CT guided biopsy of the retroperitoneal nodal mass on 11/20/2017 revealed grade I follicular lymphoma.  Immunohistochemistry revealed positive for CD10, BCL-2, BCL-6, Ki67 (15-20%) and negative for cyclin D1 and CD5.  Flow cytometry revealed a CD10+ clonal B-cell population.   Chest, abdomen, and pelvic CT on 10/29/2017 revealed an enlarged mass in the porta hepatis at site of prior presumed lymph node. Differential would include isolated recurrent lymphadenopathy from non-Hodgkin's lymphoma versus gastrointestinal stromal tumor. Lesion  was 5.8 x 4.9 cm (compared to 2.5 x 3.3 cm on 09/26/2015).   Chest, abdomen, and pelvic CT on 04/01/2018 revealed roughly stable size and appearance of portacaval lymphadenopathy, which was compatible with residual disease.  Nodal mass measured 5.8 x 4.8 cm (previously 5.9 x 5.0 cm on 10/29/2017).   Chest, abdomen, and pelvic CT on 10/29/2018 revealed slight interval increase in size of the large right retroperitoneal mass (7.1 x 5.8 cm; previously 5.7 x 5.1 cm) generating mass-effect on the IVC and right renal vein. There was possibly a small adjacent aortocaval lymph node that was stable.  There was no other lymphadenopathy in the chest, abdomen, or pelvis.  She received 4 weekly cycles of Rituxan (12/24/2017 - 01/14/2018).  She is s/p 3 cycles of maintenance Rituxan (03/24/2018 - 10/08/2018).  PET scan on 11/08/2018 revealed a 5.5 x 7.3 cm hypermetabolic (SUV 24.8) right retroperitoneal nodal mass and aortocaval lymph node.  CT guided biopsy on 18/59/0931 confirmed follicular lymphoma, grade I.  The following studies were negative on 10/23/2017:  hepatitis B core antibody, hepatitis B surface antigen, hepatitis C antibody.  G6PD assay was normal.  She was admitted to [facility] from 08/15/2015 - 08/25/2015 with bloody diarrhea then hemolytic uremic syndrome related to E. coli 0157.  She had a complicated course with acute renal failure and anemia.  She was then in rehab for 3 weeks.  She continues to work on her balance.  Bone density on 10/27/2017 revealed osteoporosis with a T-score of -3.8 in the left forearm radius.  She began every 6 month Prolia on 04/20/2018 (last 10/21/2018).  Symptomatically, she denies any B symptoms.  She feels back to normal after her biopsy. Exam reveals no adenpathy or hepatosplenomegaly.   Plan:   1. Stage IIIa follicular non-Hodgkin's lymphoma:  Re-review interval PET scan and isolated right retroperitoneal mass.  Discuss conversation with Dr. Reita Chard.  Discuss  consideration of local radiation.  Treatment not curative, but may postpone need for further treatment for some time.  Spoke with Dr. Baruch Gouty- anticipate minimal side effects.  Present at tumor board on 12/16/2018. 2.  Consult radiation oncology. 3.  RTC after completion of radiation for MD assessment and ongoing follow-up.   Honor Loh, NP  12/07/2018, 10:28 AM   I saw and evaluated the patient, participating in the key portions of the service and reviewing pertinent diagnostic studies and records.  I reviewed the nurse practitioner's note and agree with the findings and the plan.  The assessment and plan were discussed with the patient.  Multiple questions were asked by the patient and answered.   Nolon Stalls, MD 12/07/2018,10:28 AM

## 2018-12-08 ENCOUNTER — Encounter: Payer: Self-pay | Admitting: Hematology and Oncology

## 2018-12-23 ENCOUNTER — Other Ambulatory Visit: Payer: Medicare Other

## 2018-12-23 NOTE — Progress Notes (Signed)
Tumor Board Documentation  RAMISA DUMAN was presented by Dr Mike Gip at our Tumor Board on 12/23/2018, which included representatives from medical oncology, radiation oncology, surgical oncology, internal medicine, navigation, pathology, radiology, surgical, research, palliative care, pulmonology.  Anhelica currently presents as a current patient with history of the following treatments: adjuvant chemotherapy for Lymphoma.  Additionally, we reviewed previous medical and familial history, history of present illness, and recent lab results along with all available histopathologic and imaging studies. The tumor board considered available treatment options and made the following recommendations: Radiation therapy (primary modality) Refer to Dr Genevive Bi  The following procedures/referrals were also placed: No orders of the defined types were placed in this encounter.   Clinical Trial Status: not discussed   Staging used: AJCC Stage Group  Grage 1 National site-specific guidelines NCCN were discussed with respect to the case.  Tumor board is a meeting of clinicians from various specialty areas who evaluate and discuss patients for whom a multidisciplinary approach is being considered. Final determinations in the plan of care are those of the provider(s). The responsibility for follow up of recommendations given during tumor board is that of the provider.   Today's extended care, comprehensive team conference, Aayra was not present for the discussion and was not examined.   Multidisciplinary Tumor Board is a multidisciplinary case peer review process.  Decisions discussed in the Multidisciplinary Tumor Board reflect the opinions of the specialists present at the conference without having examined the patient.  Ultimately, treatment and diagnostic decisions rest with the primary provider(s) and the patient.

## 2018-12-27 ENCOUNTER — Other Ambulatory Visit: Payer: Self-pay

## 2018-12-27 ENCOUNTER — Encounter: Payer: Medicare Other | Attending: Physician Assistant | Admitting: Physician Assistant

## 2018-12-27 ENCOUNTER — Encounter: Payer: Self-pay | Admitting: Radiation Oncology

## 2018-12-27 ENCOUNTER — Ambulatory Visit
Admission: RE | Admit: 2018-12-27 | Discharge: 2018-12-27 | Disposition: A | Discharge status: Home / Self Care | Payer: Medicare Other | Source: Ambulatory Visit | Attending: Radiation Oncology | Admitting: Radiation Oncology

## 2018-12-27 VITALS — BP 144/69 | HR 69 | Temp 97.9°F | Resp 18 | Wt 109.1 lb

## 2018-12-27 DIAGNOSIS — I10 Essential (primary) hypertension: Secondary | ICD-10-CM | POA: Insufficient documentation

## 2018-12-27 DIAGNOSIS — C8513 Unspecified B-cell lymphoma, intra-abdominal lymph nodes: Secondary | ICD-10-CM | POA: Diagnosis not present

## 2018-12-27 DIAGNOSIS — H3530 Unspecified macular degeneration: Secondary | ICD-10-CM | POA: Diagnosis not present

## 2018-12-27 DIAGNOSIS — E11622 Type 2 diabetes mellitus with other skin ulcer: Secondary | ICD-10-CM | POA: Insufficient documentation

## 2018-12-27 DIAGNOSIS — L97822 Non-pressure chronic ulcer of other part of left lower leg with fat layer exposed: Secondary | ICD-10-CM | POA: Diagnosis present

## 2018-12-27 DIAGNOSIS — Z823 Family history of stroke: Secondary | ICD-10-CM | POA: Insufficient documentation

## 2018-12-27 DIAGNOSIS — Z79899 Other long term (current) drug therapy: Secondary | ICD-10-CM | POA: Insufficient documentation

## 2018-12-27 DIAGNOSIS — Z809 Family history of malignant neoplasm, unspecified: Secondary | ICD-10-CM | POA: Diagnosis not present

## 2018-12-27 DIAGNOSIS — Z8673 Personal history of transient ischemic attack (TIA), and cerebral infarction without residual deficits: Secondary | ICD-10-CM | POA: Diagnosis not present

## 2018-12-27 DIAGNOSIS — Z8249 Family history of ischemic heart disease and other diseases of the circulatory system: Secondary | ICD-10-CM | POA: Diagnosis not present

## 2018-12-27 DIAGNOSIS — Z888 Allergy status to other drugs, medicaments and biological substances status: Secondary | ICD-10-CM | POA: Diagnosis not present

## 2018-12-27 DIAGNOSIS — M810 Age-related osteoporosis without current pathological fracture: Secondary | ICD-10-CM | POA: Insufficient documentation

## 2018-12-27 DIAGNOSIS — M1990 Unspecified osteoarthritis, unspecified site: Secondary | ICD-10-CM | POA: Diagnosis not present

## 2018-12-27 DIAGNOSIS — C8200 Follicular lymphoma grade I, unspecified site: Secondary | ICD-10-CM

## 2018-12-27 NOTE — Progress Notes (Signed)
[name] ([id number]) Visit Report for 12/27/2018 Abuse/Suicide Risk Screen Details Patient Name: [name] Date of Service: 12/27/2018 8:45 AM Medical Record Number: [id number] Patient Account Number: [id number] Date of Birth/Sex: Mar 16, 1932 (86 y.o. Female) Treating RN: Cornell Barman Primary Care Cheronda Erck: Paulita Cradle Other Clinician: Referring Shakea Isip: Frazier Richards Treating Gracin Mcpartland/Extender: Melburn Hake, HOYT Weeks in Treatment: 0 Abuse/Suicide Risk Screen Items Answer ABUSE/SUICIDE RISK SCREEN: Has anyone close to you tried to hurt or harm you recentlyo No Do you feel uncomfortable with anyone in your familyo No Has anyone forced you do things that you didnot want to doo No Do you have any thoughts of harming yourselfo No Patient displays signs or symptoms of abuse and/or neglect. No Electronic Signature(s) Signed: 12/27/2018 3:49:31 PM By: Gretta Cool, BSN, RN, CWS, Kim RN, BSN Entered By: Gretta Cool, BSN, RN, CWS, Kim on 12/27/2018 09:17:29 [name] ([id number]) -------------------------------------------------------------------------------- Activities of Daily Living Details Patient Name: [name] Date of Service: 12/27/2018 8:45 AM Medical Record Number: [id number] Patient Account Number: [id number] Date of Birth/Sex: Dec 03, 1932 (86 y.o. Female) Treating RN: Cornell Barman Primary Care Louella Medaglia: Paulita Cradle Other Clinician: Referring Caeleb Batalla: Frazier Richards Treating Shiron Whetsel/Extender: Melburn Hake, HOYT Weeks in Treatment: 0 Activities of Daily Living Items Answer Activities of Daily Living (Please select one for each item) Drive Automobile Completely Able Take Medications Completely Able Use Telephone Completely Able Care for Appearance Completely Able Use Toilet Completely Able Bath / Shower Completely Able Dress Self Completely Able Feed Self Completely Able Walk Completely Able Get In / Out Bed Completely  Able Housework Completely Able Prepare Meals Completely Able Handle Money Completely Able Shop for Self Completely Able Electronic Signature(s) Signed: 12/27/2018 3:49:31 PM By: Gretta Cool, BSN, RN, CWS, Kim RN, BSN Entered By: Gretta Cool, BSN, RN, CWS, Kim on 12/27/2018 09:17:38 [name] ([id number]) -------------------------------------------------------------------------------- Education Assessment Details Patient Name: [name] Date of Service: 12/27/2018 8:45 AM Medical Record Number: [id number] Patient Account Number: [id number] Date of Birth/Sex: 10-02-1932 (86 y.o. Female) Treating RN: Cornell Barman Primary Care Jerolyn Flenniken: Paulita Cradle Other Clinician: Referring Xylina Rhoads: Frazier Richards Treating Ritta Hammes/Extender: Melburn Hake, HOYT Weeks in Treatment: 0 Primary Learner Assessed: Patient Learning Preferences/Education Level/Primary Language Learning Preference: Explanation Highest Education Level: College or Above Preferred Language: English Cognitive Barrier Assessment/Beliefs Language Barrier: No Translator Needed: No Memory Deficit: No Emotional Barrier: No Cultural/Religious Beliefs Affecting Medical Care: No Physical Barrier Assessment Impaired Vision: No Impaired Hearing: No Decreased Hand dexterity: No Knowledge/Comprehension Assessment Knowledge Level: High Comprehension Level: High Ability to understand written High instructions: Ability to understand verbal High instructions: Motivation Assessment Anxiety Level: Calm Cooperation: Cooperative Education Importance: Acknowledges Need Interest in Health Problems: Asks Questions Perception: Coherent Willingness to Engage in Self- High Management Activities: Readiness to Engage in Self- High Management Activities: Electronic Signature(s) Signed: 12/27/2018 3:49:31 PM By: Gretta Cool, BSN, RN, CWS, Kim RN, BSN Entered By: Gretta Cool, BSN, RN, CWS, Kim on 12/27/2018 09:18:06 [name]  ([id number]) -------------------------------------------------------------------------------- Fall Risk Assessment Details Patient Name: [name] Date of Service: 12/27/2018 8:45 AM Medical Record Number: [id number] Patient Account Number: [id number] Date of Birth/Sex: 03/06/1932 (86 y.o. Female) Treating RN: Cornell Barman Primary Care Aqueelah Cotrell: Paulita Cradle Other Clinician: Referring Jasaiah Karwowski: Frazier Richards Treating Negar Sieler/Extender: Melburn Hake, HOYT Weeks in Treatment: 0 Fall Risk Assessment Items Have you had 2 or more falls in the last 12 monthso 0 Yes Have you had any fall that resulted in injury in the last 12 monthso 0 Yes FALL RISK ASSESSMENT: History of  falling - immediate or within 3 months 25 Yes Secondary diagnosis 0 No Ambulatory aid None/bed rest/wheelchair/nurse 0 No Crutches/cane/walker 0 No Furniture 0 No IV Access/Saline Lock 0 No Gait/Training Normal/bed rest/immobile 0 No Weak 10 Yes Impaired 0 No Mental Status Oriented to own ability 0 No Electronic Signature(s) Signed: 12/27/2018 3:49:31 PM By: Gretta Cool, BSN, RN, CWS, Kim RN, BSN Entered By: Gretta Cool, BSN, RN, CWS, Kim on 12/27/2018 09:18:55 [name] ([id number]) -------------------------------------------------------------------------------- Foot Assessment Details Patient Name: [name] Date of Service: 12/27/2018 8:45 AM Medical Record Number: [id number] Patient Account Number: [id number] Date of Birth/Sex: 1932-02-14 (86 y.o. Female) Treating RN: Cornell Barman Primary Care Rowena Moilanen: Paulita Cradle Other Clinician: Referring Daryl Quiros: Frazier Richards Treating Vieva Brummitt/Extender: Melburn Hake, HOYT Weeks in Treatment: 0 Foot Assessment Items Site Locations + = Sensation present, - = Sensation absent, C = Callus, U = Ulcer R = Redness, W = Warmth, M = Maceration, PU = Pre-ulcerative lesion F = Fissure, S = Swelling, D = Dryness Assessment Right: Left: Other  Deformity: No No Prior Foot Ulcer: No No Prior Amputation: No No Charcot Joint: No No Ambulatory Status: Ambulatory Without Help Gait: Steady Electronic Signature(s) Signed: 12/27/2018 3:49:31 PM By: Gretta Cool, BSN, RN, CWS, Kim RN, BSN Entered By: Gretta Cool, BSN, RN, CWS, Kim on 12/27/2018 09:19:32 [name] ([id number]) -------------------------------------------------------------------------------- Nutrition Risk Assessment Details Patient Name: [name] Date of Service: 12/27/2018 8:45 AM Medical Record Number: [id number] Patient Account Number: [id number] Date of Birth/Sex: 08-26-1932 (86 y.o. Female) Treating RN: Cornell Barman Primary Care Amberia Bayless: Paulita Cradle Other Clinician: Referring Sherri Mcarthy: Frazier Richards Treating Dennys Traughber/Extender: Melburn Hake, HOYT Weeks in Treatment: 0 Height (in): 65 Weight (lbs): 106 Body Mass Index (BMI): 17.6 Nutrition Risk Assessment Items NUTRITION RISK SCREEN: I have an illness or condition that made me change the kind and/or amount of 0 No food I eat I eat fewer than two meals per day 0 No I eat few fruits and vegetables, or milk products 0 No I have three or more drinks of beer, liquor or wine almost every day 0 No I have tooth or mouth problems that make it hard for me to eat 0 No I don't always have enough money to buy the food I need 0 No I eat alone most of the time 0 No I take three or more different prescribed or over-the-counter drugs a day 1 Yes Without wanting to, I have lost or gained 10 pounds in the last six months 0 No I am not always physically able to shop, cook and/or feed myself 0 No Nutrition Protocols Good Risk Protocol 0 No interventions needed Moderate Risk Protocol Electronic Signature(s) Signed: 12/27/2018 3:49:31 PM By: Gretta Cool, BSN, RN, CWS, Kim RN, BSN Entered By: Gretta Cool, BSN, RN, CWS, Kim on 12/27/2018 09:19:10

## 2018-12-27 NOTE — Consult Note (Signed)
NEW PATIENT EVALUATION  Name: [name]  MRN: [id number]  Date:   12/27/2018     DOB: 05-16-1932   This 85 y.o. female patient presents to the clinic for initial evaluation of follicular B-cell lymphoma of the abdomen.  REFERRING PHYSICIAN: Marinda Elk, MD  CHIEF COMPLAINT:  Chief Complaint  Patient presents with  . Lymphoma    DIAGNOSIS: The encounter diagnosis was Follicular lymphoma grade I, unspecified body region [facility]).   PREVIOUS INVESTIGATIONS:  PET CT and CT scans reviewed Clinical notes reviewed Pathology reviewed Case presented at tumor board  HPI: patient is an 85 year old female with history of IIIa follicular non-Hodgkin's B-cell lymphoma. She regionally presented in 2003 with left upper quadrant mass left arm weakness and found to have a large mass in the head of pancreas with multiple enlarged retroperitoneal nodes. Chest and MRI of the left brachial plexus showing an abnormal lymph node along the left brachial plexus and supraclavicular region. Cervical biopsy was positive for grade 1 follicular non-Hodgkin's B-cell lymphoma lymphoma. Bone marrow was negative. She received Leukeran and Rituxan and radiation therapy to her left radial plexus. She developed progressive disease in 2004 went on to receive CVP chemotherapy and maintenance Rituxan.recent CT scan in November of 2019 showed a 7.5 x 5.8 cm mass with mass effect on the IVC and right renal vein. She received 4 cycles of Rituxan as well as 3 cycles of maintenance Rituxan which was discontinued in October 2019. Recent PET CT scan in November showed a 5.5 x 7.3 cm hypermetabolic retroperitoneal nodal mass and aortocaval nodes. CT-guided biopsy in December 6160 confirmed follicular lymphoma grade 1.she was presented at our weekly tumor board with the above-stated findings and recommendation for radiation therapy to her abdominal retroperitoneal mass was made. She is seen today still having some balance  issues she also has some night sweats she specifically denies fever chills or weight loss.  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    PAST SURGICAL HISTORY:  Past Surgical History:  Procedure Laterality Date  . BONE MARROW BIOPSY  08/09/2002  . BREAST LUMPECTOMY  1950s   benign  . CATARACT EXTRACTION  03/2009  . LYMPH NODE BIOPSY  08/07/2002   cervical  . TUBAL LIGATION      FAMILY HISTORY: family history includes Cancer in her father; Cancer (age of onset: 37) in her sister.  SOCIAL HISTORY:  reports that she has never smoked. She has never used smokeless tobacco. She reports that she does not drink alcohol or use drugs.  ALLERGIES: Lisinopril  MEDICATIONS:  Current Outpatient Medications  Medication Sig Dispense Refill  . Acetaminophen (TYLENOL ARTHRITIS EXT RELIEF PO) Take 1 tablet by mouth daily as needed (pain).    [name] allopurinol (ZYLOPRIM) 300 MG tablet TAKE ONE TABLET BY MOUTH ONE TIME DAILY  90 tablet 0  . AZELASTINE & FLUTICASONE NA 1 spray each nase twice daily.    . calcium-vitamin D (CALCIUM 500/D) 500-200 MG-UNIT tablet Take 1 tablet by mouth 2 (two) times daily.     [name] denosumab (PROLIA) 60 MG/ML SOSY injection Inject 60 mg into the skin every 6 (six) months.     . Flax Oil-Fish Oil-Borage Oil (FISH OIL-FLAX OIL-BORAGE OIL) CAPS Take 1,000-1,400 mg by mouth daily.    [name]  Multiple Vitamin (MULTI-VITAMINS) TABS Take 1 tablet by mouth daily.     . Multiple Vitamins-Minerals (PRESERVISION AREDS 2 PO) Take 1 tablet by mouth daily.     [name] omeprazole (PRILOSEC) 40 MG capsule Take 40 mg by mouth daily.    . Probiotic Product (PROBIOTIC DAILY PO) Take 2 tablets by mouth daily.     .  traZODone (DESYREL) 50 MG tablet TAKE 1 TO 2 TABLETS BY MOUTH DAILY AT BEDTIME AS NEEDED    . triamcinolone cream (KENALOG) 0.1 % Apply 1 application topically 2 (two) times daily.     . Turmeric Curcumin 500 MG CAPS Take 1 capsule by mouth daily.      No current facility-administered medications for this encounter.     ECOG PERFORMANCE STATUS:  0 - Asymptomatic  REVIEW OF SYSTEMS:  Patient denies any weight loss, fatigue, weakness, fever, chills or night sweats. Patient denies any loss of vision, blurred vision. Patient denies any ringing  of the ears or hearing loss. No irregular heartbeat. Patient denies heart murmur or history of fainting. Patient denies any chest pain or pain radiating to her upper extremities. Patient denies any shortness of breath, difficulty breathing at night, cough or hemoptysis. Patient denies any swelling in the lower legs. Patient denies any nausea vomiting, vomiting of blood, or coffee ground material in the vomitus. Patient denies any stomach pain. Patient states has had normal bowel movements no significant constipation or diarrhea. Patient denies any dysuria, hematuria or significant nocturia. Patient denies any problems walking, swelling in the joints or loss of balance. Patient denies any skin changes, loss of hair or loss of weight. Patient denies any excessive worrying or anxiety or significant depression. Patient denies any problems with insomnia. Patient denies excessive thirst, polyuria, polydipsia. Patient denies any swollen glands, patient denies easy bruising or easy bleeding. Patient denies any recent infections, allergies or URI. Patient "s visual fields have not changed significantly in recent time.    PHYSICAL EXAM: BP (!) 144/69   Pulse 69   Temp 97.9 F (36.6 C)   Resp 18   Wt 109 lb 2 oz (49.5 kg)   BMI 18.16 kg/m  Thin female in NAD. Can really not palpate any abdominal mass.Well-developed well-nourished patient in NAD. HEENT reveals PERLA,  EOMI, discs not visualized.  Oral cavity is clear. No oral mucosal lesions are identified. Neck is clear without evidence of cervical or supraclavicular adenopathy. Lungs are clear to A&P. Cardiac examination is essentially unremarkable with regular rate and rhythm without murmur rub or thrill. Abdomen is benign with no organomegaly or masses noted. Motor sensory and DTR levels are equal and symmetric in the upper and lower extremities. Cranial nerves II through XII are grossly intact. Proprioception is intact. No peripheral adenopathy or edema is identified. No motor or sensory levels are noted. Crude visual fields are within normal range.  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 Would plan on delivering 3000 cGy in 15 fractions. Based on its extremely close approximation to her kidney small bowel liver which is I M RT treatment planning and delivery. Risks and benefits of treatment including possible alteration of blood counts diarrhea nausea fatigue skin reaction all were discussed in detail with the patient and her husband.There will be  extra effort by both professional staff as well as technical staff to coordinate and manage concurrent chemoradiation and ensuing side effects during hertreatments.eye first step and ordered CT simulation with PET/CT fusion study later this week.  I would like to take this opportunity to thank you for allowing me to participate in the care of your patient.Noreene Filbert, MD

## 2018-12-27 NOTE — Progress Notes (Signed)
[name] ([id number]) Visit Report for 12/27/2018 Allergy List Details Patient Name: [name], [name] Date of Service: 12/27/2018 8:45 AM Medical Record Number: [id number] Patient Account Number: [id number] Date of Birth/Sex: 01-13-1932 (86 y.o. Female) Treating RN: Cornell Barman Primary Care Marcelus Dubberly: Paulita Cradle Other Clinician: Referring Xian Apostol: Frazier Richards Treating Derell Bruun/Extender: Melburn Hake, HOYT Weeks in Treatment: 0 Allergies Active Allergies Neosporin (neo-bac-polym) lisinopril Reaction: Cough Allergy Notes Electronic Signature(s) Signed: 12/27/2018 3:49:31 PM By: Gretta Cool, BSN, RN, CWS, Kim RN, BSN Entered By: Gretta Cool, BSN, RN, CWS, Kim on 12/27/2018 09:09:46 [name] ([id number]) -------------------------------------------------------------------------------- Arrival Information Details Patient Name: [name] Date of Service: 12/27/2018 8:45 AM Medical Record Number: [id number] Patient Account Number: [id number] Date of Birth/Sex: 07/06/1932 (86 y.o. Female) Treating RN: Cornell Barman Primary Care Maelyn Berrey: Paulita Cradle Other Clinician: Referring Martesha Niedermeier: Frazier Richards Treating Shanayah Kaffenberger/Extender: Melburn Hake, HOYT Weeks in Treatment: 0 Visit Information Patient Arrived: Ambulatory Arrival Time: 08:54 Accompanied By: Kandice Moos Transfer Assistance: None Patient Identification Verified: Yes Secondary Verification Process Completed: Yes Patient Has Alerts: Yes Patient Alerts: NOT Diabetic Electronic Signature(s) Signed: 12/27/2018 3:49:31 PM By: Gretta Cool, BSN, RN, CWS, Kim RN, BSN Entered By: Gretta Cool, BSN, RN, CWS, Kim on 12/27/2018 08:55:21 [name] ([id number]) -------------------------------------------------------------------------------- Clinic Level of Care Assessment Details Patient Name: [name] Date of Service: 12/27/2018 8:45 AM Medical Record Number: [id number] Patient Account  Number: [id number] Date of Birth/Sex: Jan 16, 1932 (86 y.o. Female) Treating RN: Harold Barban Primary Care Sheril Hammond: Paulita Cradle Other Clinician: Referring Trimaine Maser: Frazier Richards Treating Kasim Mccorkle/Extender: Melburn Hake, HOYT Weeks in Treatment: 0 Clinic Level of Care Assessment Items TOOL 2 Quantity Score []  - Use when only an EandM is performed on the INITIAL visit 0 ASSESSMENTS - Nursing Assessment / Reassessment X - General Physical Exam (combine w/ comprehensive assessment (listed just below) when 1 20 performed on new pt. evals) X- 1 25 Comprehensive Assessment (HX, ROS, Risk Assessments, Wounds Hx, etc.) ASSESSMENTS - Wound and Skin Assessment / Reassessment X - Simple Wound Assessment / Reassessment - one wound 1 5 []  - 0 Complex Wound Assessment / Reassessment - multiple wounds []  - 0 Dermatologic / Skin Assessment (not related to wound area) ASSESSMENTS - Ostomy and/or Continence Assessment and Care []  - Incontinence Assessment and Management 0 []  - 0 Ostomy Care Assessment and Management (repouching, etc.) PROCESS - Coordination of Care X - Simple Patient / Family Education for ongoing care 1 15 []  - 0 Complex (extensive) Patient / Family Education for ongoing care []  - 0 Staff obtains Programmer, systems, Records, Test Results / Process Orders X- 1 10 Staff telephones HHA, Nursing Homes / Clarify orders / etc []  - 0 Routine Transfer to another Facility (non-emergent condition) []  - 0 Routine Hospital Admission (non-emergent condition) []  - 0 New Admissions / Biomedical engineer / Ordering NPWT, Apligraf, etc. []  - 0 Emergency Hospital Admission (emergent condition) X- 1 10 Simple Discharge Coordination []  - 0 Complex (extensive) Discharge Coordination PROCESS - Special Needs []  - Pediatric / Minor Patient Management 0 []  - 0 Isolation Patient Management [name], Sylvia B. ([id number]) []  - 0 Hearing / Language / Visual special needs []  -  0 Assessment of Community assistance (transportation, D/C planning, etc.) []  - 0 Additional assistance / Altered mentation []  - 0 Support Surface(s) Assessment (bed, cushion, seat, etc.) INTERVENTIONS - Wound Cleansing / Measurement X - Wound Imaging (photographs - any number of wounds) 1 5 []  - 0 Wound Tracing (instead of photographs) X- 1 5 Simple Wound  Measurement - one wound []  - 0 Complex Wound Measurement - multiple wounds X- 1 5 Simple Wound Cleansing - one wound []  - 0 Complex Wound Cleansing - multiple wounds INTERVENTIONS - Wound Dressings X - Small Wound Dressing one or multiple wounds 1 10 []  - 0 Medium Wound Dressing one or multiple wounds []  - 0 Large Wound Dressing one or multiple wounds []  - 0 Application of Medications - injection INTERVENTIONS - Miscellaneous []  - External ear exam 0 []  - 0 Specimen Collection (cultures, biopsies, blood, body fluids, etc.) []  - 0 Specimen(s) / Culture(s) sent or taken to Lab for analysis []  - 0 Patient Transfer (multiple staff / Civil Service fast streamer / Similar devices) []  - 0 Simple Staple / Suture removal (25 or less) []  - 0 Complex Staple / Suture removal (26 or more) []  - 0 Hypo / Hyperglycemic Management (close monitor of Blood Glucose) X- 1 15 Ankle / Brachial Index (ABI) - do not check if billed separately Has the patient been seen at the hospital within the last three years: Yes Total Score: 125 Level Of Care: New/Established - Level 4 Electronic Signature(s) Signed: 12/27/2018 3:50:02 PM By: Harold Barban Entered By: Harold Barban on 12/27/2018 09:43:47 [name], [name] ([id number]) -------------------------------------------------------------------------------- Lower Extremity Assessment Details Patient Name: [name] Date of Service: 12/27/2018 8:45 AM Medical Record Number: [id number] Patient Account Number: [id number] Date of Birth/Sex: 1932-07-25 (86 y.o. Female) Treating RN: Cornell Barman Primary Care Rayshard Schirtzinger: Paulita Cradle Other Clinician: Referring Inas Avena: Frazier Richards Treating Stefanee Mckell/Extender: Melburn Hake, HOYT Weeks in Treatment: 0 Edema Assessment Assessed: [Left: No] [Right: No] Edema: [Left: N] [Right: o] Calf Left: Right: Point of Measurement: 30 cm From Medial Instep 30.3 cm cm Ankle Left: Right: Point of Measurement: 10 cm From Medial Instep 22.5 cm cm Vascular Assessment Pulses: Dorsalis Pedis Palpable: [Left:Yes] [Right:Yes] Posterior Tibial Palpable: [Left:Yes] [Right:Yes] Extremity colors, hair growth, and conditions: Extremity Color: [Left:Hyperpigmented] [Right:Hyperpigmented] Hair Growth on Extremity: [Left:No] [Right:No] Temperature of Extremity: [Left:Warm] [Right:Warm] Capillary Refill: [Left:< 3 seconds] [Right:< 3 seconds] Dependent Rubor: [Left:No] [Right:No] Blood Pressure: Brachial: [Left:100] [Right:100] Dorsalis Pedis: 180 [Left:Dorsalis Pedis: 150] Ankle: Posterior Tibial: 160 [Left:Posterior Tibial: 150 1.80] [Right:1.50] Toe Nail Assessment Left: Right: Thick: No No Discolored: No No Deformed: No No Improper Length and Hygiene: No No Electronic Signature(s) Signed: 12/27/2018 3:49:31 PM By: Gretta Cool, BSN, RN, CWS, Kim RN, BSN Entered By: Gretta Cool, BSN, RN, CWS, Kim on 12/27/2018 09:26:40 [name] ([id number]) -------------------------------------------------------------------------------- Multi Wound Chart Details Patient Name: [name] Date of Service: 12/27/2018 8:45 AM Medical Record Number: [id number] Patient Account Number: [id number] Date of Birth/Sex: 1932-05-26 (86 y.o. Female) Treating RN: Harold Barban Primary Care Amarria Andreasen: Paulita Cradle Other Clinician: Referring Marzell Isakson: Frazier Richards Treating Lyvonne Cassell/Extender: STONE III, HOYT Weeks in Treatment: 0 Vital Signs Height(in): 65 Pulse(bpm): 75 Weight(lbs): 106 Blood Pressure(mmHg): 151/50 Body Mass  Index(BMI): 18 Temperature(F): 98.1 Respiratory Rate 16 (breaths/min): Photos: [1:No Photos] [N/A:N/A] Wound Location: [1:Left Lower Leg - Medial] [N/A:N/A] Wounding Event: [1:Trauma] [N/A:N/A] Primary Etiology: [1:Trauma, Other] [N/A:N/A] Date Acquired: [1:11/15/2018] [N/A:N/A] Weeks of Treatment: [1:0] [N/A:N/A] Wound Status: [1:Open] [N/A:N/A] Measurements L x W x D [1:3x2x0.1] [N/A:N/A] (cm) Area (cm) : [1:4.712] [N/A:N/A] Volume (cm) : [1:0.471] [N/A:N/A] Classification: [1:Full Thickness Without Exposed Support Structures] [N/A:N/A] Exudate Amount: [1:Medium] [N/A:N/A] Exudate Type: [1:Serous] [N/A:N/A] Exudate Color: [1:amber] [N/A:N/A] Wound Margin: [1:Flat and Intact] [N/A:N/A] Granulation Amount: [1:Medium (34-66%)] [N/A:N/A] Granulation Quality: [1:Pink] [N/A:N/A] Necrotic Amount: [1:Medium (34-66%)] [N/A:N/A] Exposed Structures: [1:Fat Layer (Subcutaneous Tissue) Exposed:  Yes Fascia: No Tendon: No Muscle: No Joint: No Bone: No] [N/A:N/A] Epithelialization: [1:Small (1-33%)] [N/A:N/A] Periwound Skin Texture: [1:Excoriation: No Induration: No Callus: No Crepitus: No Rash: No Scarring: No] [N/A:N/A] Periwound Skin Moisture: [1:Maceration: No Dry/Scaly: No] [N/A:N/A] Periwound Skin Color: [N/A:N/A] Hemosiderin Staining: Yes Atrophie Blanche: No Cyanosis: No Ecchymosis: No Erythema: No Mottled: No Pallor: No Rubor: No Temperature: No Abnormality N/A N/A Tenderness on Palpation: Yes N/A N/A Wound Preparation: Ulcer Cleansing: N/A N/A Rinsed/Irrigated with Saline Topical Anesthetic Applied: Other: lidocaine 4% Treatment Notes Electronic Signature(s) Signed: 12/27/2018 3:50:02 PM By: Harold Barban Entered By: Harold Barban on 12/27/2018 09:34:59 [name] ([id number]) -------------------------------------------------------------------------------- Multi-Disciplinary Care Plan Details Patient Name: [name] Date of Service:  12/27/2018 8:45 AM Medical Record Number: [id number] Patient Account Number: [id number] Date of Birth/Sex: August 13, 1932 (86 y.o. Female) Treating RN: Harold Barban Primary Care Loralai Eisman: Paulita Cradle Other Clinician: Referring Jeaneen Cala: Frazier Richards Treating Corlene Sabia/Extender: Melburn Hake, HOYT Weeks in Treatment: 0 Active Inactive Wound/Skin Impairment Nursing Diagnoses: Impaired tissue integrity Knowledge deficit related to ulceration/compromised skin integrity Goals: Ulcer/skin breakdown will have a volume reduction of 30% by week 4 Date Initiated: 12/27/2018 Target Resolution Date: 01/27/2019 Goal Status: Active Interventions: Assess patient/caregiver ability to obtain necessary supplies Assess patient/caregiver ability to perform ulcer/skin care regimen upon admission and as needed Assess ulceration(s) every visit Notes: Electronic Signature(s) Signed: 12/27/2018 3:50:02 PM By: Harold Barban Entered By: Harold Barban on 12/27/2018 09:34:39 [name], [name] ([id number]) -------------------------------------------------------------------------------- Pain Assessment Details Patient Name: [name] Date of Service: 12/27/2018 8:45 AM Medical Record Number: [id number] Patient Account Number: [id number] Date of Birth/Sex: 22-Oct-1932 (86 y.o. Female) Treating RN: Cornell Barman Primary Care Chelsia Serres: Paulita Cradle Other Clinician: Referring Constant [place]: Frazier Richards Treating Shakea Isip/Extender: Melburn Hake, HOYT Weeks in Treatment: 0 Active Problems Location of Pain Severity and Description of Pain Patient Has Paino No Site Locations With Dressing Change: Yes Duration of the Pain. Constant / Intermittento Constant Rate the pain. Current Pain Level: 5 Character of Pain Describe the Pain: Aching, Tender Pain Management and Medication Current Pain Management: Leg drop or elevation: Yes Electronic Signature(s) Signed: 12/27/2018 3:49:31 PM By:  Gretta Cool, BSN, RN, CWS, Kim RN, BSN Entered By: Gretta Cool, BSN, RN, CWS, Kim on 12/27/2018 08:56:28 [name] ([id number]) -------------------------------------------------------------------------------- Patient/Caregiver Education Details Patient Name: [name] Date of Service: 12/27/2018 8:45 AM Medical Record Number: [id number] Patient Account Number: [id number] Date of Birth/Gender: Apr 13, 1932 (86 y.o. Female) Treating RN: Harold Barban Primary Care Physician: Paulita Cradle Other Clinician: Referring Physician: Frazier Richards Treating Physician/Extender: Melburn Hake, HOYT Weeks in Treatment: 0 Education Assessment Education Provided To: Patient Education Topics Provided Welcome To [facility]: Handouts: Welcome [facility] Methods: Demonstration, Explain/Verbal Responses: State content correctly Wound/Skin Impairment: Handouts: Caring for Your Ulcer Methods: Demonstration, Explain/Verbal Responses: State content correctly Electronic Signature(s) Signed: 12/27/2018 3:50:02 PM By: Harold Barban Entered By: Harold Barban on 12/27/2018 09:35:25 [name], [name] ([id number]) -------------------------------------------------------------------------------- Wound Assessment Details Patient Name: [name] Date of Service: 12/27/2018 8:45 AM Medical Record Number: [id number] Patient Account Number: [id number] Date of Birth/Sex: [birth year] (86 y.o. Female) Treating RN: Cornell Barman Primary Care Yennifer Segovia: Paulita Cradle Other Clinician: Referring Katrenia Alkins: Frazier Richards Treating Koby Pickup/Extender: STONE III, HOYT Weeks in Treatment: 0 Wound Status Wound Number: 1 Primary Etiology: Trauma, Other Wound Location: Left Lower Leg - Medial Wound Status: Open Wounding Event: Trauma Date Acquired: 11/15/2018 Weeks Of Treatment: 0 Clustered Wound: No Photos Photo Uploaded By: Gretta Cool, BSN, RN, CWS,  Kim on 12/27/2018  15:58:35 Wound Measurements Length: (cm) 3 Width: (cm) 2 Depth: (cm) 0.1 Area: (cm) 4.712 Volume: (cm) 0.471 % Reduction in Area: % Reduction in Volume: Epithelialization: Small (1-33%) Tunneling: No Undermining: No Wound Description Full Thickness Without Exposed Support Foul Odo Classification: Structures Slough/F Wound Margin: Flat and Intact Exudate Medium Amount: Exudate Type: Serous Exudate Color: amber r After Cleansing: No ibrino Yes Wound Bed Granulation Amount: Medium (34-66%) Exposed Structure Granulation Quality: Pink Fascia Exposed: No Necrotic Amount: Medium (34-66%) Fat Layer (Subcutaneous Tissue) Exposed: Yes Necrotic Quality: Adherent Slough Tendon Exposed: No Muscle Exposed: No Joint Exposed: No Bone Exposed: No [name], Sylvia B. ([id number]) Periwound Skin Texture Texture Color No Abnormalities Noted: No No Abnormalities Noted: No Callus: No Atrophie Blanche: No Crepitus: No Cyanosis: No Excoriation: No Ecchymosis: No Induration: No Erythema: No Rash: No Hemosiderin Staining: Yes Scarring: No Mottled: No Pallor: No Moisture Rubor: No No Abnormalities Noted: No Dry / Scaly: No Temperature / Pain Maceration: No Temperature: No Abnormality Tenderness on Palpation: Yes Wound Preparation Ulcer Cleansing: Rinsed/Irrigated with Saline Topical Anesthetic Applied: Other: lidocaine 4%, Electronic Signature(s) Signed: 12/27/2018 3:49:31 PM By: Gretta Cool, BSN, RN, CWS, Kim RN, BSN Entered By: Gretta Cool, BSN, RN, CWS, Kim on 12/27/2018 09:03:09 CHELLE, [name] ([id number]) -------------------------------------------------------------------------------- Vitals Details Patient Name: [name] Date of Service: 12/27/2018 8:45 AM Medical Record Number: [id number] Patient Account Number: [id number] Date of Birth/Sex: Aug 17, 1932 (86 y.o. Female) Treating RN: Cornell Barman Primary Care Payam Gribble: Paulita Cradle Other  Clinician: Referring Nya Monds: Frazier Richards Treating [place]ah Yeats/Extender: Melburn Hake, HOYT Weeks in Treatment: 0 Vital Signs Time Taken: 08:56 Temperature (F): 98.1 Height (in): 65 Pulse (bpm): 75 Weight (lbs): 106 Respiratory Rate (breaths/min): 16 Body Mass Index (BMI): 17.6 Blood Pressure (mmHg): 151/50 Reference Range: 80 - 120 mg / dl Electronic Signature(s) Signed: 12/27/2018 3:49:31 PM By: Gretta Cool, BSN, RN, CWS, Kim RN, BSN Entered By: Gretta Cool, BSN, RN, CWS, Kim on 12/27/2018 08:57:01

## 2018-12-27 NOTE — Progress Notes (Signed)
[name], [name] ([id number]) Visit Report for 12/27/2018 Chief Complaint Document Details Patient Name: [name], [name] Date of Service: 12/27/2018 8:45 AM Medical Record Number: [id number] Patient Account Number: [id number] Date of Birth/Sex: 01/31/1932 (86 y.o. Female) Treating RN: Harold Barban Primary Care Provider: Paulita Cradle Other Clinician: Referring Provider: Frazier Richards Treating Provider/Extender: Melburn Hake, Rozlynn Lippold Weeks in Treatment: 0 Information Obtained from: Patient Chief Complaint Left LE ulcer Electronic Signature(s) Signed: 12/27/2018 3:59:30 PM By: Worthy Keeler PA-C Entered By: Worthy Keeler on 12/27/2018 09:33:13 Johns, [name] ([id number]) -------------------------------------------------------------------------------- Debridement Details Patient Name: [name] Date of Service: 12/27/2018 8:45 AM Medical Record Number: [id number] Patient Account Number: [id number] Date of Birth/Sex: August 04, 1932 (86 y.o. Female) Treating RN: Harold Barban Primary Care Provider: Paulita Cradle Other Clinician: Referring Provider: Frazier Richards Treating Provider/Extender: Melburn Hake, Jamarkis Branam Weeks in Treatment: 0 Debridement Performed for Wound #1 Left,Medial Lower Leg Assessment: Performed By: Physician STONE III, Nichlas Pitera E., PA-C Debridement Type: Debridement Level of Consciousness (Pre- Awake and Alert procedure): Pre-procedure Verification/Time Yes - 09:41 Out Taken: Start Time: 09:41 Pain Control: Lidocaine Total Area Debrided (L x W): 3 (cm) x 2 (cm) = 6 (cm) Tissue and other material Viable, Non-Viable, Slough, Subcutaneous, Slough debrided: Level: Skin/Subcutaneous Tissue Debridement Description: Excisional Instrument: Curette Bleeding: Minimum Hemostasis Achieved: Pressure End Time: 09:43 Procedural Pain: 0 Post Procedural Pain: 0 Response to Treatment: Procedure was tolerated well Level of  Consciousness Awake and Alert (Post-procedure): Post Debridement Measurements of Total Wound Length: (cm) 3 Width: (cm) 2 Depth: (cm) 0.1 Volume: (cm) 0.471 Character of Wound/Ulcer Post Debridement: Improved Post Procedure Diagnosis Same as Pre-procedure Electronic Signature(s) Signed: 12/27/2018 3:50:02 PM By: Harold Barban Signed: 12/27/2018 3:59:30 PM By: Worthy Keeler PA-C Entered By: Harold Barban on 12/27/2018 09:42:31 Bentson, [name] ([id number]) -------------------------------------------------------------------------------- HPI Details Patient Name: [name] Date of Service: 12/27/2018 8:45 AM Medical Record Number: [id number] Patient Account Number: [id number] Date of Birth/Sex: 15-Sep-1932 (86 y.o. Female) Treating RN: Harold Barban Primary Care Provider: Paulita Cradle Other Clinician: Referring Provider: Frazier Richards Treating Provider/Extender: Melburn Hake, Deloris Mittag Weeks in Treatment: 0 History of Present Illness HPI Description: 12/27/18 on evaluation today patient presents for initial presentation our clinic concerning an injury which occurred to her left lower leg around the first week of December when she hit her leg on a box of Christmas lights. She states that she is very thin skin and bruises easily and subsequently this formed a blister on rate the skin which subsequently opened. Since that time she has been attempting to treat this without good success. At one point it sounds as if someone put alginate on the area although it became stocking was pulling off her skin therefore she avoided that and went to just utilizing Vaseline. This has not been the worst thing for her as things seem to be doing fairly well at this point in time. She is currently getting ready to undergo radiation therapy for Hodgkin lymphoma. Otherwise she has no other major medical problems that would affect wound healing at this time. No fevers, chills, nausea,  or vomiting noted at this time. Electronic Signature(s) Signed: 12/27/2018 3:59:30 PM By: Worthy Keeler PA-C Entered By: Worthy Keeler on 12/27/2018 15:30:18 [name] ([id number]) -------------------------------------------------------------------------------- Physical Exam Details Patient Name: [name] Date of Service: 12/27/2018 8:45 AM Medical Record Number: [id number] Patient Account Number: [id number] Date of Birth/Sex: 1932-06-10 (86 y.o. Female) Treating RN: Harold Barban Primary Care Provider: Paulita Cradle Other Clinician:  Referring Provider: Frazier Richards Treating Provider/Extender: STONE III, Xoey Warmoth Weeks in Treatment: 0 Constitutional patient is hypertensive.. pulse regular and within target range for patient.[name] respirations regular, non-labored and within target range for patient.[name] temperature within target range for patient.. Well-nourished and well-hydrated in no acute distress. Eyes conjunctiva clear no eyelid edema noted. pupils equal round and reactive to light and accommodation. Respiratory normal breathing without difficulty. clear to auscultation bilaterally. Cardiovascular regular rate and rhythm with normal S1, S2. 1+ dorsalis pedis/posterior tibialis pulses. no clubbing, cyanosis, significant edema, <3 sec cap refill. Gastrointestinal (GI) soft, non-tender, non-distended, +BS. no ventral hernia noted. Musculoskeletal unsteady while walking. Psychiatric this patient is able to make decisions and demonstrates good insight into disease process. Alert and Oriented x 3. pleasant and cooperative. Notes Upon evaluation today patient's wound bed did have some Slough noted on the surface of the wound although this was minimal. She was able to tolerate sharp debridement at this point without complication post debridement the wound bed appears to be doing significantly better which is great news. Overall I think this has a very good  chance that healing appropriately without any significant complications or problems. Electronic Signature(s) Signed: 12/27/2018 3:59:30 PM By: Worthy Keeler PA-C Entered By: Worthy Keeler on 12/27/2018 15:31:03 [name] ([id number]) -------------------------------------------------------------------------------- Physician Orders Details Patient Name: [name] Date of Service: 12/27/2018 8:45 AM Medical Record Number: [id number] Patient Account Number: [id number] Date of Birth/Sex: 1932-09-01 (86 y.o. Female) Treating RN: Harold Barban Primary Care Provider: Paulita Cradle Other Clinician: Referring Provider: Frazier Richards Treating Provider/Extender: Melburn Hake, Donata Reddick Weeks in Treatment: 0 Verbal / Phone Orders: No Diagnosis Coding ICD-10 Coding Code Description S81.802A Unspecified open wound, left lower leg, initial encounter L97.822 Non-pressure chronic ulcer of other part of left lower leg with fat layer exposed C81.70 Other Hodgkin lymphoma, unspecified site Wound Cleansing Wound #1 Left,Medial Lower Leg o May Shower, gently pat wound dry prior to applying new dressing. - Clean are with Dial Antibacterial Soap Primary Wound Dressing Wound #1 Left,Medial Lower Leg o Xeroform Secondary Dressing Wound #1 Left,Medial Lower Leg o Boardered Foam Dressing Dressing Change Frequency Wound #1 Left,Medial Lower Leg o Change dressing every other day. Follow-up Appointments Wound #1 Left,Medial Lower Leg o Return Appointment in 1 week. Electronic Signature(s) Signed: 12/27/2018 3:50:02 PM By: Harold Barban Signed: 12/27/2018 3:59:30 PM By: Worthy Keeler PA-C Entered By: Harold Barban on 12/27/2018 09:44:31 [name], [name] ([id number]) -------------------------------------------------------------------------------- Problem List Details Patient Name: [name] Date of Service: 12/27/2018 8:45 AM Medical Record  Number: [id number] Patient Account Number: [id number] Date of Birth/Sex: 1932/10/11 (86 y.o. Female) Treating RN: Harold Barban Primary Care Provider: Paulita Cradle Other Clinician: Referring Provider: Frazier Richards Treating Provider/Extender: Melburn Hake, Creta Dorame Weeks in Treatment: 0 Active Problems ICD-10 Evaluated Encounter Code Description Active Date Today Diagnosis S81.802A Unspecified open wound, left lower leg, initial encounter 12/27/2018 No Yes L97.822 Non-pressure chronic ulcer of other part of left lower leg with 12/27/2018 No Yes fat layer exposed C81.70 Other Hodgkin lymphoma, unspecified site 12/27/2018 No Yes Inactive Problems Resolved Problems Electronic Signature(s) Signed: 12/27/2018 3:59:30 PM By: Worthy Keeler PA-C Entered By: Worthy Keeler on 12/27/2018 09:32:59 [name], [name] ([id number]) -------------------------------------------------------------------------------- Progress Note Details Patient Name: [name] Date of Service: 12/27/2018 8:45 AM Medical Record Number: [id number] Patient Account Number: [id number] Date of Birth/Sex: [birth year] (86 y.o. Female) Treating RN: Harold Barban Primary Care Provider: Paulita Cradle Other Clinician: Referring Provider: Frazier Richards Treating Provider/Extender:  STONE III, Delynn Olvera Weeks in Treatment: 0 Subjective Chief Complaint Information obtained from Patient Left LE ulcer History of Present Illness (HPI) 12/27/18 on evaluation today patient presents for initial presentation our clinic concerning an injury which occurred to her left lower leg around the first week of December when she hit her leg on a box of Christmas lights. She states that she is very thin skin and bruises easily and subsequently this formed a blister on rate the skin which subsequently opened. Since that time she has been attempting to treat this without good success. At one point it sounds as if someone put  alginate on the area although it became stocking was pulling off her skin therefore she avoided that and went to just utilizing Vaseline. This has not been the worst thing for her as things seem to be doing fairly well at this point in time. She is currently getting ready to undergo radiation therapy for Hodgkin lymphoma. Otherwise she has no other major medical problems that would affect wound healing at this time. No fevers, chills, nausea, or vomiting noted at this time. Wound History Patient presents with 1 open wound that has been present for approximately 1.5 months. Patient has been treating wound in the following manner: vasaline. Laboratory tests have been performed in the last month. Patient reportedly has not tested positive for an antibiotic resistant organism. Patient reportedly has not tested positive for osteomyelitis. Patient reportedly has not had testing performed to evaluate circulation in the legs. Patient History Information obtained from Patient, Chart. Allergies Neosporin (neo-bac-polym), lisinopril (Reaction: Cough) Family History Cancer - Father,Siblings, Heart Disease - Father, Hypertension - Father, Stroke - Father, No family history of Diabetes, Kidney Disease, Lung Disease, Seizures, Thyroid Problems, Tuberculosis. Social History Never smoker, Marital Status - Married, Alcohol Use - Rarely, Drug Use - No History, Caffeine Use - Rarely. Medical History Eyes Patient has history of Cataracts - removed Denies history of Glaucoma, Optic Neuritis Ear/Nose/Mouth/Throat Denies history of Chronic sinus problems/congestion, Middle ear problems Hematologic/Lymphatic Denies history of Anemia, Hemophilia, Human Immunodeficiency Virus, Lymphedema, Sickle Cell Disease Respiratory Denies history of Aspiration, Asthma, Chronic Obstructive Pulmonary Disease (COPD), Pneumothorax, Sleep Apnea, Tuberculosis [name], Sylvia B. ([id number]) Cardiovascular Patient has history  of Hypertension, Hypotension, Peripheral Venous Disease Denies history of Angina, Arrhythmia, Congestive Heart Failure, Coronary Artery Disease, Deep Vein Thrombosis, Myocardial Infarction, Peripheral Arterial Disease, Phlebitis, Vasculitis Gastrointestinal Denies history of Cirrhosis , Colitis, Crohn s, Hepatitis A, Hepatitis B, Hepatitis C Endocrine Denies history of Type I Diabetes, Type II Diabetes Genitourinary Denies history of End Stage Renal Disease Immunological Denies history of Lupus Erythematosus, Raynaud s, Scleroderma Integumentary (Skin) Denies history of History of Burn, History of pressure wounds Musculoskeletal Patient has history of Osteoarthritis - Knee right Denies history of Gout, Rheumatoid Arthritis, Osteomyelitis Neurologic Denies history of Dementia, Neuropathy, Quadriplegia, Paraplegia, Seizure Disorder Oncologic Patient has history of Received Chemotherapy - Last year, Received Radiation Psychiatric Denies history of Anorexia/bulimia, Confinement Anxiety Medical And Surgical History Notes Constitutional Symptoms (General Health) Hodgin's Lymphoma (starting Radiation); Eyes Macular Degeneration Review of Systems (ROS) Constitutional Symptoms (General Health) Complains or has symptoms of Fatigue. Denies complaints or symptoms of Fever, Chills, Marked Weight Change. Eyes Complains or has symptoms of Glasses / Contacts. Denies complaints or symptoms of Dry Eyes, Vision Changes. Ear/Nose/Mouth/Throat The patient has no complaints or symptoms. Hematologic/Lymphatic The patient has no complaints or symptoms. Respiratory The patient has no complaints or symptoms. Cardiovascular Complains or has symptoms of LE edema. Denies complaints  or symptoms of Chest pain. Gastrointestinal The patient has no complaints or symptoms. Endocrine The patient has no complaints or symptoms. Genitourinary The patient has no complaints or symptoms. Immunological The  patient has no complaints or symptoms. Integumentary (Skin) Complains or has symptoms of Wounds, Bleeding or bruising tendency, Swelling. Denies complaints or symptoms of Breakdown. Musculoskeletal The patient has no complaints or symptoms. Neurologic [name], Sylvia B. ([id number]) The patient has no complaints or symptoms. Oncologic The patient has no complaints or symptoms, Hodgin's Lymphoma 2003 remission, back 2017 Psychiatric The patient has no complaints or symptoms. Objective Constitutional patient is hypertensive.. pulse regular and within target range for patient.[name] respirations regular, non-labored and within target range for patient.[name] temperature within target range for patient.. Well-nourished and well-hydrated in no acute distress. Vitals Time Taken: 8:56 AM, Height: 65 in, Weight: 106 lbs, BMI: 17.6, Temperature: 98.1 F, Pulse: 75 bpm, Respiratory Rate: 16 breaths/min, Blood Pressure: 151/50 mmHg. Eyes conjunctiva clear no eyelid edema noted. pupils equal round and reactive to light and accommodation. Respiratory normal breathing without difficulty. clear to auscultation bilaterally. Cardiovascular regular rate and rhythm with normal S1, S2. 1+ dorsalis pedis/posterior tibialis pulses. no clubbing, cyanosis, significant edema, Gastrointestinal (GI) soft, non-tender, non-distended, +BS. no ventral hernia noted. Musculoskeletal unsteady while walking. Psychiatric this patient is able to make decisions and demonstrates good insight into disease process. Alert and Oriented x 3. pleasant and cooperative. General Notes: Upon evaluation today patient's wound bed did have some Slough noted on the surface of the wound although this was minimal. She was able to tolerate sharp debridement at this point without complication post debridement the wound bed appears to be doing significantly better which is great news. Overall I think this has a very good chance that healing  appropriately without any significant complications or problems. Integumentary (Hair, Skin) Wound #1 status is Open. Original cause of wound was Trauma. The wound is located on the Left,Medial Lower Leg. The wound measures 3cm length x 2cm width x 0.1cm depth; 4.712cm^2 area and 0.471cm^3 volume. There is Fat Layer (Subcutaneous Tissue) Exposed exposed. There is no tunneling or undermining noted. There is a medium amount of serous drainage noted. The wound margin is flat and intact. There is medium (34-66%) pink granulation within the wound bed. There is a medium (34-66%) amount of necrotic tissue within the wound bed including Adherent Slough. The periwound skin appearance exhibited: Hemosiderin Staining. The periwound skin appearance did not exhibit: Callus, Crepitus, Excoriation, Induration, Rash, Scarring, Dry/Scaly, Maceration, Atrophie Blanche, Cyanosis, Ecchymosis, Mottled, Pallor, Rubor, Erythema. Periwound temperature was noted as No Abnormality. The periwound has tenderness on palpation. [name], Sylvia B. ([id number]) Assessment Active Problems ICD-10 Unspecified open wound, left lower leg, initial encounter Non-pressure chronic ulcer of other part of left lower leg with fat layer exposed Other Hodgkin lymphoma, unspecified site Procedures Wound #1 Pre-procedure diagnosis of Wound #1 is a Trauma, Other located on the Left,Medial Lower Leg . There was a Excisional Skin/Subcutaneous Tissue Debridement with a total area of 6 sq cm performed by STONE III, Jahseh Lucchese E., PA-C. With the following instrument(s): Curette to remove Viable and Non-Viable tissue/material. Material removed includes Subcutaneous Tissue and Slough and after achieving pain control using Lidocaine. No specimens were taken. A time out was conducted at 09:41, prior to the start of the procedure. A Minimum amount of bleeding was controlled with Pressure. The procedure was tolerated well with a pain level of 0 throughout  and a pain level of 0 following the procedure.  Post Debridement Measurements: 3cm length x 2cm width x 0.1cm depth; 0.471cm^3 volume. Character of Wound/Ulcer Post Debridement is improved. Post procedure Diagnosis Wound #1: Same as Pre-Procedure Plan Wound Cleansing: Wound #1 Left,Medial Lower Leg: May Shower, gently pat wound dry prior to applying new dressing. - Clean are with Dial Antibacterial Soap Primary Wound Dressing: Wound #1 Left,Medial Lower Leg: Xeroform Secondary Dressing: Wound #1 Left,Medial Lower Leg: Boardered Foam Dressing Dressing Change Frequency: Wound #1 Left,Medial Lower Leg: Change dressing every other day. Follow-up Appointments: Wound #1 Left,Medial Lower Leg: Return Appointment in 1 week. I'm going to suggest currently that we go ahead and continue with the above wound care measures for the next week. Patient is in agreement the plan. Subsequently we're gonna see were things stand at follow-up. I'm hopeful that Xeroform gauze dressing will be of benefit for her from the standpoint of providing a moist wound environment to allow healing while at the [facility] [place] ([id number]) same time preventing drying to the wound bed and causing injury with dressing changes. If anything changes or worsens she will let me know. Please see above for specific wound care orders. We will see patient for re-evaluation in 1 week(s) here in the clinic. If anything worsens or changes patient will contact our office for additional recommendations. Electronic Signature(s) Signed: 12/27/2018 3:59:30 PM By: Worthy Keeler PA-C Entered By: Worthy Keeler on 12/27/2018 15:31:40 [name] ([id number]) -------------------------------------------------------------------------------- ROS/PFSH Details Patient Name: [name] Date of Service: 12/27/2018 8:45 AM Medical Record Number: [id number] Patient Account Number: [id number] Date of Birth/Sex: Jul 29, 1932  (86 y.o. Female) Treating RN: Cornell Barman Primary Care Provider: Paulita Cradle Other Clinician: Referring Provider: Frazier Richards Treating Provider/Extender: Melburn Hake, Priscillia Fouch Weeks in Treatment: 0 Information Obtained From Patient Chart Wound History Do you currently have one or more open woundso Yes How many open wounds do you currently haveo 1 Approximately how long have you had your woundso 1.5 months How have you been treating your wound(s) until nowo vasaline Has your wound(s) ever healed and then re-openedo No Have you had any lab work done in the past montho Yes Who ordered the lab work doneo [facility] center Have you tested positive for an antibiotic resistant organism (MRSA, VRE)o No Have you tested positive for osteomyelitis (bone infection)o No Have you had any tests for circulation on your legso No Constitutional Symptoms (General Health) Complaints and Symptoms: Positive for: Fatigue Negative for: Fever; Chills; Marked Weight Change Medical History: Past Medical History Notes: Hodgin's Lymphoma (starting Radiation); Eyes Complaints and Symptoms: Positive for: Glasses / Contacts Negative for: Dry Eyes; Vision Changes Medical History: Positive for: Cataracts - removed Negative for: Glaucoma; Optic Neuritis Past Medical History Notes: Macular Degeneration Cardiovascular Complaints and Symptoms: Positive for: LE edema Negative for: Chest pain Medical History: Positive for: Hypertension; Hypotension; Peripheral Venous Disease Negative for: Angina; Arrhythmia; Congestive Heart Failure; Coronary Artery Disease; Deep Vein Thrombosis; Myocardial Infarction; Peripheral Arterial Disease; Phlebitis; Vasculitis Integumentary (Skin) Limb, Aasha B. ([id number]) Complaints and Symptoms: Positive for: Wounds; Bleeding or bruising tendency; Swelling Negative for: Breakdown Medical History: Negative for: History of Burn; History of pressure  wounds Ear/Nose/Mouth/Throat Complaints and Symptoms: No Complaints or Symptoms Medical History: Negative for: Chronic sinus problems/congestion; Middle ear problems Hematologic/Lymphatic Complaints and Symptoms: No Complaints or Symptoms Medical History: Negative for: Anemia; Hemophilia; Human Immunodeficiency Virus; Lymphedema; Sickle Cell Disease Respiratory Complaints and Symptoms: No Complaints or Symptoms Medical History: Negative for: Aspiration; Asthma; Chronic Obstructive Pulmonary Disease (COPD); Pneumothorax; Sleep  Apnea; Tuberculosis Gastrointestinal Complaints and Symptoms: No Complaints or Symptoms Medical History: Negative for: Cirrhosis ; Colitis; Crohnos; Hepatitis A; Hepatitis B; Hepatitis C Endocrine Complaints and Symptoms: No Complaints or Symptoms Medical History: Negative for: Type I Diabetes; Type II Diabetes Genitourinary Complaints and Symptoms: No Complaints or Symptoms Medical History: Negative for: End Stage Renal Disease Immunological Complaints and Symptoms: No Complaints or Symptoms [name], Sylvia B. ([id number]) Medical History: Negative for: Lupus Erythematosus; Raynaudos; Scleroderma Musculoskeletal Complaints and Symptoms: No Complaints or Symptoms Medical History: Positive for: Osteoarthritis - Knee right Negative for: Gout; Rheumatoid Arthritis; Osteomyelitis Neurologic Complaints and Symptoms: No Complaints or Symptoms Medical History: Negative for: Dementia; Neuropathy; Quadriplegia; Paraplegia; Seizure Disorder Oncologic Complaints and Symptoms: No Complaints or Symptoms Complaints and Symptoms: Review of System Notes: Hodgin's Lymphoma 2003 remission, back 2017 Medical History: Positive for: Received Chemotherapy - Last year; Received Radiation Psychiatric Complaints and Symptoms: No Complaints or Symptoms Medical History: Negative for: Anorexia/bulimia; Confinement Anxiety HBO Extended History  Items Eyes: Cataracts Immunizations Pneumococcal Vaccine: Received Pneumococcal Vaccination: Yes Implantable Devices Family and Social History Cancer: Yes - Father,Siblings; Diabetes: No; Heart Disease: Yes - Father; Hypertension: Yes - Father; Kidney Disease: No; Lung Disease: No; Seizures: No; Stroke: Yes - Father; Thyroid Problems: No; Tuberculosis: No; Never smoker; Marital Status - Married; Alcohol Use: Rarely; Drug Use: No History; Caffeine Use: Rarely; Financial Concerns: No; Food, Clothing or Shelter Needs: No; Support System Lacking: No; Transportation Concerns: No; Advanced Directives: Yes; Patient does not want information on Advanced Directives; Living Will: Yes; Medical Power of AttorneyTKEYA, STENCIL ([id number]) Electronic Signature(s) Signed: 12/27/2018 3:49:31 PM By: Gretta Cool, BSN, RN, CWS, Kim RN, BSN Signed: 12/27/2018 3:59:30 PM By: Worthy Keeler PA-C Entered By: Gretta Cool BSN, RN, CWS, Kim on 12/27/2018 09:17:13 [name], [name] ([id number]) -------------------------------------------------------------------------------- [place] Details Patient Name: [name] Date of Service: 12/27/2018 Medical Record Number: [id number] Patient Account Number: [id number] Date of Birth/Sex: [birth year] (86 y.o. Female) Treating RN: Harold Barban Primary Care Provider: Paulita Cradle Other Clinician: Referring Provider: Frazier Richards Treating Provider/Extender: Melburn Hake, Zarek Relph Weeks in Treatment: 0 Diagnosis Coding ICD-10 Codes Code Description S81.802A Unspecified open wound, left lower leg, initial encounter L97.822 Non-pressure chronic ulcer of other part of left lower leg with fat layer exposed C81.70 Other Hodgkin lymphoma, unspecified site Facility Procedures CPT4 Code Description: 70263785 99214 - WOUND CARE VISIT-LEV 4 EST PT Modifier: Quantity: 1 CPT4 Code Description: 88502774 11042 - DEB SUBQ TISSUE 20 SQ CM/< ICD-10 Diagnosis  Description L97.822 Non-pressure chronic ulcer of other part of left lower leg with Modifier: fat layer expos Quantity: 1 ed Physician Procedures CPT4 Code Description: [id number] WC PHYS LEVEL 3 o NEW PT ICD-10 Diagnosis Description S81.802A Unspecified open wound, left lower leg, initial encounter L97.822 Non-pressure chronic ulcer of other part of left lower leg with C81.70 Other Hodgkin lymphoma,  unspecified site Modifier: 25 fat layer expos Quantity: 1 ed CPT4 Code Description: 6720947 09628 - WC PHYS SUBQ TISS 20 SQ CM ICD-10 Diagnosis Description L97.822 Non-pressure chronic ulcer of other part of left lower leg with Modifier: fat layer expos Quantity: 1 ed Electronic Signature(s) Signed: 12/27/2018 3:59:30 PM By: Worthy Keeler PA-C Entered By: Worthy Keeler on 12/27/2018 15:31:56

## 2018-12-29 ENCOUNTER — Ambulatory Visit
Admission: RE | Admit: 2018-12-29 | Discharge: 2018-12-29 | Disposition: A | Discharge status: Home / Self Care | Payer: Medicare Other | Source: Ambulatory Visit | Attending: Radiation Oncology | Admitting: Radiation Oncology

## 2018-12-29 DIAGNOSIS — Z510 Encounter for antineoplastic radiation therapy: Secondary | ICD-10-CM | POA: Insufficient documentation

## 2018-12-29 DIAGNOSIS — C8293 Follicular lymphoma, unspecified, intra-abdominal lymph nodes: Secondary | ICD-10-CM | POA: Diagnosis not present

## 2018-12-30 DIAGNOSIS — Z510 Encounter for antineoplastic radiation therapy: Secondary | ICD-10-CM | POA: Diagnosis not present

## 2019-01-03 ENCOUNTER — Encounter: Payer: Medicare Other | Admitting: Physician Assistant

## 2019-01-03 DIAGNOSIS — E11622 Type 2 diabetes mellitus with other skin ulcer: Secondary | ICD-10-CM | POA: Diagnosis not present

## 2019-01-04 NOTE — Progress Notes (Signed)
OMEGA, DURANTE ([id number]) Visit Report for 01/03/2019 Chief Complaint Document Details Patient Name: [name], [name] Date of Service: 01/03/2019 9:00 AM Medical Record Number: [id number] Patient Account Number: [id number] Date of Birth/Sex: 03/06/1932 (86 y.o. F) Treating RN: [name] Primary Care Provider: Paulita Sanchez Other Clinician: Referring Provider: Paulita Sanchez Treating Provider/Extender: [name] Weeks in Treatment: 1 Information Obtained from: Patient Chief Complaint Left LE ulcer Electronic Signature(s) Signed: 01/03/2019 9:58:15 PM By: Worthy Keeler PA-C Entered By: Worthy Keeler on 01/03/2019 09:16:05 [name], [name] ([id number]) -------------------------------------------------------------------------------- Debridement Details Patient Name: [name] Date of Service: 01/03/2019 9:00 AM Medical Record Number: [id number] Patient Account Number: [id number] Date of Birth/Sex: 19-Aug-1932 (86 y.o. F) Treating RN: [name] Primary Care Provider: Paulita Sanchez Other Clinician: Referring Provider: Paulita Sanchez Treating Provider/Extender: [name] Weeks in Treatment: 1 Debridement Performed for Wound #1 Left,Medial Lower Leg Assessment: Performed By: Physician Sylvia III, Sanchez E., PA-C Debridement Type: Debridement Level of Consciousness (Pre- Awake and Alert procedure): Pre-procedure Verification/Time Yes - 09:17 Out Taken: Start Time: 09:17 Pain Control: Lidocaine Total Area Debrided (L x W): 1.6 (cm) x 0.9 (cm) = 1.44 (cm) Tissue and other material Viable, Non-Viable, Slough, Subcutaneous, Biofilm, Slough debrided: Level: Skin/Subcutaneous Tissue Debridement Description: Excisional Instrument: Curette Bleeding: Minimum Hemostasis Achieved: Pressure End Time: 09:19 Procedural Pain: 0 Post Procedural Pain: 0 Response to Treatment: Procedure was tolerated well Level of  Consciousness Awake and Alert (Post-procedure): Post Debridement Measurements of Total Wound Length: (cm) 1.6 Width: (cm) 0.9 Depth: (cm) 0.1 Volume: (cm) 0.113 Character of Wound/Ulcer Post Debridement: Improved Post Procedure Diagnosis Same as Pre-procedure Electronic Signature(s) Signed: 01/03/2019 5:13:49 PM By: [name] Signed: 01/03/2019 9:58:15 PM By: Worthy Keeler PA-C Entered By: [name] on 01/03/2019 09:19:48 [name], [name] ([id number]) -------------------------------------------------------------------------------- HPI Details Patient Name: [name] Date of Service: 01/03/2019 9:00 AM Medical Record Number: [id number] Patient Account Number: [id number] Date of Birth/Sex: 11-04-1932 (86 y.o. F) Treating RN: [name] Primary Care Provider: Paulita Sanchez Other Clinician: Referring Provider: Paulita Sanchez Treating Provider/Extender: [name] Weeks in Treatment: 1 History of Present Illness HPI Description: 12/27/18 on evaluation today patient presents for initial presentation our clinic concerning an injury which occurred to her left lower leg around the first week of December when she hit her leg on a box of Christmas lights. She states that she is very thin skin and bruises easily and subsequently this formed a blister on rate the skin which subsequently opened. Since that time she has been attempting to treat this without good success. At one point it sounds as if someone put alginate on the area although it became stocking was pulling off her skin therefore she avoided that and went to just utilizing Vaseline. This has not been the worst thing for her as things seem to be doing fairly well at this point in time. She is currently getting ready to undergo radiation therapy for Hodgkin lymphoma. Otherwise she has no other major medical problems that would affect wound healing at this time. No fevers, chills, nausea, or  vomiting noted at this time. 01/03/19 on evaluation today patient appears to be doing much better in regard to her lower Trinity ulcer. She's been tolerating the dressing changes without complication. Fortunately there's no signs of infection at this time which is also good news. Overall I'm very pleased with the progress she has made in just one week. Electronic Signature(s) Signed: 01/03/2019 9:58:15 PM By: Sylvia Hake,  Sylvia Grizzle PA-C Entered By: Worthy Keeler on 01/03/2019 10:36:44 [name] -------------------------------------------------------------------------------- Physical Exam Details Patient Name: [name], [name] Date of Service: 01/03/2019 9:00 AM Medical Record Number: [id number] Patient Account Number: [id number] Date of Birth/Sex: 20-Mar-1932 (86 y.o. F) Treating RN: [name] Primary Care Provider: Paulita Sanchez Other Clinician: Referring Provider: Paulita Sanchez Treating Provider/Extender: Sylvia III, Sanchez Weeks in Treatment: 1 Constitutional Well-nourished and well-hydrated in no acute distress. Respiratory normal breathing without difficulty. Psychiatric this patient is able to make decisions and demonstrates good insight into disease process. Alert and Oriented x 3. pleasant and cooperative. Notes Patient's wound bed currently shows signs of good granulation she did have some Slough noted on the surface of the wound which require sharp debridement today. Post debridement the wound bed appears to be doing significantly better which is great news she has excellent epithelialization noted around the edges of the wound which is also good. There is no significant lower extremity edema. Electronic Signature(s) Signed: 01/03/2019 9:58:15 PM By: Worthy Keeler PA-C Entered By: Worthy Keeler on 01/03/2019 10:37:18 [name]  ([id number]) -------------------------------------------------------------------------------- Physician Orders Details Patient Name: [name] Date of Service: 01/03/2019 9:00 AM Medical Record Number: [id number] Patient Account Number: [id number] Date of Birth/Sex: Jan 21, 1932 (86 y.o. F) Treating RN: [name] Primary Care Provider: Paulita Sanchez Other Clinician: Referring Provider: Paulita Sanchez Treating Provider/Extender: [name] Weeks in Treatment: 1 Verbal / Phone Orders: No Diagnosis Coding ICD-10 Coding Code Description S81.802A Unspecified open wound, left lower leg, initial encounter L97.822 Non-pressure chronic ulcer of other part of left lower leg with fat layer exposed C81.70 Other Hodgkin lymphoma, unspecified site Wound Cleansing Wound #1 Left,Medial Lower Leg o May Shower, gently pat wound dry prior to applying new dressing. - Clean are with Dial Antibacterial Soap Primary Wound Dressing Wound #1 Left,Medial Lower Leg o Xeroform Secondary Dressing Wound #1 Left,Medial Lower Leg o Boardered Foam Dressing Dressing Change Frequency Wound #1 Left,Medial Lower Leg o Change dressing every other day. Follow-up Appointments Wound #1 Left,Medial Lower Leg o Return Appointment in 1 week. Electronic Signature(s) Signed: 01/03/2019 5:13:49 PM By: [name] Signed: 01/03/2019 9:58:15 PM By: Worthy Keeler PA-C Entered By: [name] on 01/03/2019 09:23:43 [name], [name] ([id number]) -------------------------------------------------------------------------------- Problem List Details Patient Name: [name] Date of Service: 01/03/2019 9:00 AM Medical Record Number: [id number] Patient Account Number: [id number] Date of Birth/Sex: May 29, 1932 (86 y.o. F) Treating RN: [name] Primary Care Provider: Paulita Sanchez Other Clinician: Referring Provider: Paulita Sanchez Treating  Provider/Extender: [name] Weeks in Treatment: 1 Active Problems ICD-10 Evaluated Encounter Code Description Active Date Today Diagnosis S81.802A Unspecified open wound, left lower leg, initial encounter 12/27/2018 No Yes L97.822 Non-pressure chronic ulcer of other part of left lower leg with 12/27/2018 No Yes fat layer exposed C81.70 Other Hodgkin lymphoma, unspecified site 12/27/2018 No Yes Inactive Problems Resolved Problems Electronic Signature(s) Signed: 01/03/2019 9:58:15 PM By: Worthy Keeler PA-C Entered By: Worthy Keeler on 01/03/2019 09:16:00 [name] ([id number]) -------------------------------------------------------------------------------- Progress Note Details Patient Name: [name] Date of Service: 01/03/2019 9:00 AM Medical Record Number: [id number] Patient Account Number: [id number] Date of Birth/Sex: 16-Aug-1932 (86 y.o. F) Treating RN: [name] Primary Care Provider: Paulita Sanchez Other Clinician: Referring Provider: Paulita Sanchez Treating Provider/Extender: [name] Weeks in Treatment: 1 Subjective Chief Complaint Information obtained from Patient Left LE ulcer History of Present Illness (HPI) 12/27/18 on evaluation today patient presents for initial presentation  our clinic concerning an injury which occurred to her left lower leg around the first week of December when she hit her leg on a box of Christmas lights. She states that she is very thin skin and bruises easily and subsequently this formed a blister on rate the skin which subsequently opened. Since that time she has been attempting to treat this without good success. At one point it sounds as if someone put alginate on the area although it became stocking was pulling off her skin therefore she avoided that and went to just utilizing Vaseline. This has not been the worst thing for her as things seem to be doing fairly well at this point in time. She  is currently getting ready to undergo radiation therapy for Hodgkin lymphoma. Otherwise she has no other major medical problems that would affect wound healing at this time. No fevers, chills, nausea, or vomiting noted at this time. 01/03/19 on evaluation today patient appears to be doing much better in regard to her lower Trinity ulcer. She's been tolerating the dressing changes without complication. Fortunately there's no signs of infection at this time which is also good news. Overall I'm very pleased with the progress she has made in just one week. Patient History Information obtained from Patient. Family History Cancer - Father,Siblings, Heart Disease - Father, Hypertension - Father, Stroke - Father, No family history of Diabetes, Kidney Disease, Lung Disease, Seizures, Thyroid Problems, Tuberculosis. Social History Never smoker, Marital Status - Married, Alcohol Use - Rarely, Drug Use - No History, Caffeine Use - Rarely. Medical And Surgical History Notes Constitutional Symptoms (General Health) Hodgin's Lymphoma (starting Radiation); Eyes Macular Degeneration Review of Systems (ROS) Constitutional Symptoms (General Health) Denies complaints or symptoms of Fatigue, Fever, Chills. Respiratory The patient has no complaints or symptoms. Cardiovascular The patient has no complaints or symptoms. Psychiatric The patient has no complaints or symptoms. [name], Sylvia B. ([id number]) Objective Constitutional Well-nourished and well-hydrated in no acute distress. Vitals Time Taken: 9:03 AM, Height: 65 in, Weight: 106 lbs, BMI: 17.6, Temperature: 98.1 F, Pulse: 66 bpm, Respiratory Rate: 16 breaths/min, Blood Pressure: 146/66 mmHg. Respiratory normal breathing without difficulty. Psychiatric this patient is able to make decisions and demonstrates good insight into disease process. Alert and Oriented x 3. pleasant and cooperative. General Notes: Patient's wound bed currently shows  signs of good granulation she did have some Slough noted on the surface of the wound which require sharp debridement today. Post debridement the wound bed appears to be doing significantly better which is great news she has excellent epithelialization noted around the edges of the wound which is also good. There is no significant lower extremity edema. Integumentary (Hair, Skin) Wound #1 status is Open. Original cause of wound was Trauma. The wound is located on the Left,Medial Lower Leg. The wound measures 1.6cm length x 0.9cm width x 0.1cm depth; 1.131cm^2 area and 0.113cm^3 volume. There is Fat Layer (Subcutaneous Tissue) Exposed exposed. There is no tunneling or undermining noted. There is a small amount of serous drainage noted. The wound margin is flat and intact. There is medium (34-66%) pink granulation within the wound bed. There is a medium (34-66%) amount of necrotic tissue within the wound bed including Adherent Slough. The periwound skin appearance exhibited: Hemosiderin Staining. The periwound skin appearance did not exhibit: Callus, Crepitus, Excoriation, Induration, Rash, Scarring, Dry/Scaly, Maceration, Atrophie Blanche, Cyanosis, Ecchymosis, Mottled, Pallor, Rubor, Erythema. Periwound temperature was noted as No Abnormality. The periwound has tenderness on palpation. Assessment Active Problems  ICD-10 Unspecified open wound, left lower leg, initial encounter Non-pressure chronic ulcer of other part of left lower leg with fat layer exposed Other Hodgkin lymphoma, unspecified site Procedures [name], Sylvia B. ([id number]) Wound #1 Pre-procedure diagnosis of Wound #1 is a Trauma, Other located on the Left,Medial Lower Leg . There was a Excisional Skin/Subcutaneous Tissue Debridement with a total area of 1.44 sq cm performed by Sylvia III, Sanchez E., PA-C. With the following instrument(s): Curette to remove Viable and Non-Viable tissue/material. Material removed includes  Subcutaneous Tissue, Slough, and Biofilm after achieving pain control using Lidocaine. No specimens were taken. A time out was conducted at 09:17, prior to the start of the procedure. A Minimum amount of bleeding was controlled with Pressure. The procedure was tolerated well with a pain level of 0 throughout and a pain level of 0 following the procedure. Post Debridement Measurements: 1.6cm length x 0.9cm width x 0.1cm depth; 0.113cm^3 volume. Character of Wound/Ulcer Post Debridement is improved. Post procedure Diagnosis Wound #1: Same as Pre-Procedure Plan Wound Cleansing: Wound #1 Left,Medial Lower Leg: May Shower, gently pat wound dry prior to applying new dressing. - Clean are with Dial Antibacterial Soap Primary Wound Dressing: Wound #1 Left,Medial Lower Leg: Xeroform Secondary Dressing: Wound #1 Left,Medial Lower Leg: Boardered Foam Dressing Dressing Change Frequency: Wound #1 Left,Medial Lower Leg: Change dressing every other day. Follow-up Appointments: Wound #1 Left,Medial Lower Leg: Return Appointment in 1 week. I'm gonna suggest that we continue with the above wound care measures for the next week. Patient is in agreement the plan. If is your form seems to be causing too much hyper granular tissue that we will make adjustments as needed at that point. Otherwise my hope is that she will continue to show good epithelialization over the next week as well. Please see above for specific wound care orders. We will see patient for re-evaluation in 1 week(s) here in the clinic. If anything worsens or changes patient will contact our office for additional recommendations. Electronic Signature(s) Signed: 01/03/2019 9:58:15 PM By: Worthy Keeler PA-C Entered By: Worthy Keeler on 01/03/2019 10:37:34 [name] ([id number]) -------------------------------------------------------------------------------- ROS/PFSH Details Patient Name: [name] Date of  Service: 01/03/2019 9:00 AM Medical Record Number: [id number] Patient Account Number: [id number] Date of Birth/Sex: 08/04/1932 (86 y.o. F) Treating RN: [name] Primary Care Provider: Paulita Sanchez Other Clinician: Referring Provider: Paulita Sanchez Treating Provider/Extender: [name] Weeks in Treatment: 1 Information Obtained From Patient Wound History Do you currently have one or more open woundso Yes How many open wounds do you currently haveo 1 Approximately how long have you had your woundso 1.5 months How have you been treating your wound(s) until nowo vasaline Has your wound(s) ever healed and then re-openedo No Have you had any lab work done in the past montho Yes Who ordered the lab work [place] [facility] Have you tested positive for an antibiotic resistant organism (MRSA, VRE)o No Have you tested positive for osteomyelitis (bone infection)o No Have you had any tests for circulation on your legso No Constitutional Symptoms (General Health) Complaints and Symptoms: Negative for: Fatigue; Fever; Chills Medical History: Past Medical History Notes: Hodgin's Lymphoma (starting Radiation); Eyes Medical History: Positive for: Cataracts - removed Negative for: Glaucoma; Optic Neuritis Past Medical History Notes: Macular Degeneration Ear/Nose/Mouth/Throat Medical History: Negative for: Chronic sinus problems/congestion; Middle ear problems Hematologic/Lymphatic Medical History: Negative for: Anemia; Hemophilia; Human Immunodeficiency Virus; Lymphedema; Sickle Cell Disease Respiratory Complaints and Symptoms: No Complaints or Symptoms Medical  History: Negative for: Aspiration; Asthma; Chronic Obstructive Pulmonary Disease (COPD); Pneumothorax; Sleep Apnea; [place], [place] ([id number]) Tuberculosis Cardiovascular Complaints and Symptoms: No Complaints or Symptoms Medical History: Positive for: Hypertension; Hypotension; Peripheral Venous  Disease Negative for: Angina; Arrhythmia; Congestive Heart Failure; Coronary Artery Disease; Deep Vein Thrombosis; Myocardial Infarction; Peripheral Arterial Disease; Phlebitis; Vasculitis Gastrointestinal Medical History: Negative for: Cirrhosis ; Colitis; Crohnos; Hepatitis A; Hepatitis B; Hepatitis C Endocrine Medical History: Negative for: Type I Diabetes; Type II Diabetes Genitourinary Medical History: Negative for: End Stage Renal Disease Immunological Medical History: Negative for: Lupus Erythematosus; Raynaudos; Scleroderma Integumentary (Skin) Medical History: Negative for: History of Burn; History of pressure wounds Musculoskeletal Medical History: Positive for: Osteoarthritis - Knee right Negative for: Gout; Rheumatoid Arthritis; Osteomyelitis Neurologic Medical History: Negative for: Dementia; Neuropathy; Quadriplegia; Paraplegia; Seizure Disorder Oncologic Medical History: Positive for: Received Chemotherapy - Last year; Received Radiation Psychiatric Complaints and Symptoms: No Complaints or Symptoms Medical History: [name], Sylvia B. ([id number]) Negative for: Anorexia/bulimia; Confinement Anxiety HBO Extended History Items Eyes: Cataracts Immunizations Pneumococcal Vaccine: Received Pneumococcal Vaccination: Yes Implantable Devices Family and Social History Cancer: Yes - Father,Siblings; Diabetes: No; Heart Disease: Yes - Father; Hypertension: Yes - Father; Kidney Disease: No; Lung Disease: No; Seizures: No; Stroke: Yes - Father; Thyroid Problems: No; Tuberculosis: No; Never smoker; Marital Status - Married; Alcohol Use: Rarely; Drug Use: No History; Caffeine Use: Rarely; Financial Concerns: No; Food, Clothing or Shelter Needs: No; Support System Lacking: No; Transportation Concerns: No; Advanced Directives: Yes (Not Provided); Patient does not want information on Advanced Directives; Living Will: Yes (Not Provided); Medical Power of Attorney:  Yes (Not Provided) Physician Affirmation I have reviewed and agree with the above information. Electronic Signature(s) Signed: 01/03/2019 5:13:49 PM By: [name] Signed: 01/03/2019 9:58:15 PM By: Worthy Keeler PA-C Entered By: Worthy Keeler on 01/03/2019 10:37:06 [name] ([id number]) -------------------------------------------------------------------------------- SuperBill Details Patient Name: [name] Date of Service: 01/03/2019 Medical Record Number: [id number] Patient Account Number: [id number] Date of Birth/Sex: Apr 07, 1932 (86 y.o. F) Treating RN: [name] Primary Care Provider: Paulita Sanchez Other Clinician: Referring Provider: Paulita Sanchez Treating Provider/Extender: [name] Weeks in Treatment: 1 Diagnosis Coding ICD-10 Codes Code Description S81.802A Unspecified open wound, left lower leg, initial encounter L97.822 Non-pressure chronic ulcer of other part of left lower leg with fat layer exposed C81.70 Other Hodgkin lymphoma, unspecified site Facility Procedures CPT4 Code Description: 92010071 11042 - DEB SUBQ TISSUE 20 SQ CM/< ICD-10 Diagnosis Description L97.822 Non-pressure chronic ulcer of other part of left lower leg with Modifier: fat layer expos Quantity: 1 ed Physician Procedures CPT4 Code Description: 2197588 32549 - WC PHYS SUBQ TISS 20 SQ CM ICD-10 Diagnosis Description L97.822 Non-pressure chronic ulcer of other part of left lower leg with Modifier: fat layer expos Quantity: 1 ed Electronic Signature(s) Signed: 01/03/2019 9:58:15 PM By: Worthy Keeler PA-C Entered By: Worthy Keeler on 01/03/2019 10:37:40

## 2019-01-04 NOTE — Progress Notes (Signed)
MADIA, CARVELL ([id number]) Visit Report for 01/03/2019 Arrival Information Details Patient Name: [name], [name] Date of Service: 01/03/2019 9:00 AM Medical Record Number: [id number] Patient Account Number: [id number] Date of Birth/Sex: 26-Apr-1932 (86 y.o. F) Treating RN: Secundino Ginger Primary Care Shalece Staffa: Paulita Cradle Other Clinician: Referring Kaitland Lewellyn: Paulita Cradle Treating Katrenia Alkins/Extender: Melburn Hake, HOYT Weeks in Treatment: 1 Visit Information History Since Last Visit Added or deleted any medications: No Patient Arrived: Ambulatory Any new allergies or adverse reactions: No Arrival Time: 08:56 Had a fall or experienced change in No Accompanied By: spouse activities of daily living that may affect Transfer Assistance: None risk of falls: Patient Identification Verified: Yes Signs or symptoms of abuse/neglect since last visito No Patient Has Alerts: Yes Hospitalized since last visit: No Patient Alerts: NOT Diabetic Implantable device outside of the clinic excluding No cellular tissue based products placed in the center since last visit: Pain Present Now: No Electronic Signature(s) Signed: 01/03/2019 1:48:10 PM By: Secundino Ginger Entered By: Secundino Ginger on 01/03/2019 08:59:56 Willet, Quay Burow ([id number]) -------------------------------------------------------------------------------- Encounter Discharge Information Details Patient Name: [name] Date of Service: 01/03/2019 9:00 AM Medical Record Number: [id number] Patient Account Number: [id number] Date of Birth/Sex: September 19, 1932 (86 y.o. F) Treating RN: Harold Barban Primary Care Brianca Fortenberry: Paulita Cradle Other Clinician: Referring Makana Rostad: Paulita Cradle Treating Hanalei Glace/Extender: Melburn Hake, HOYT Weeks in Treatment: 1 Encounter Discharge Information Items Post Procedure Vitals Discharge Condition: Stable Temperature (F): 98.1 Ambulatory Status: Ambulatory Pulse (bpm):  66 Discharge Destination: Home Respiratory Rate (breaths/min): 16 Transportation: Private Auto Blood Pressure (mmHg): 146/66 Accompanied By: spouse Schedule Follow-up Appointment: Yes Clinical Summary of Care: Electronic Signature(s) Signed: 01/03/2019 5:13:49 PM By: Harold Barban Entered By: Harold Barban on 01/03/2019 09:27:13 [name] ([id number]) -------------------------------------------------------------------------------- Lower Extremity Assessment Details Patient Name: [name] Date of Service: 01/03/2019 9:00 AM Medical Record Number: [id number] Patient Account Number: [id number] Date of Birth/Sex: August 28, 1932 (86 y.o. F) Treating RN: Secundino Ginger Primary Care Mars Scheaffer: Paulita Cradle Other Clinician: Referring Juventino Pavone: Paulita Cradle Treating Satish Hammers/Extender: Melburn Hake, HOYT Weeks in Treatment: 1 Edema Assessment Assessed: [Left: No] [Right: No] [Left: Edema] [Right: :] Calf Left: Right: Point of Measurement: 30 cm From Medial Instep 29 cm cm Ankle Left: Right: Point of Measurement: 10 cm From Medial Instep 19 cm cm Vascular Assessment Claudication: Claudication Assessment [Left:None] Pulses: Dorsalis Pedis Palpable: [Left:Yes] Posterior Tibial Extremity colors, hair growth, and conditions: Extremity Color: [Left:Hyperpigmented] Hair Growth on Extremity: [Left:No] Temperature of Extremity: [Left:Warm] Capillary Refill: [Left:< 3 seconds] Toe Nail Assessment Left: Right: Thick: No Discolored: No Deformed: No Improper Length and Hygiene: No Electronic Signature(s) Signed: 01/03/2019 1:48:10 PM By: Secundino Ginger Entered By: Secundino Ginger on 01/03/2019 09:07:10 Hoecker, Quay Burow ([id number]) -------------------------------------------------------------------------------- Multi Wound Chart Details Patient Name: [name] Date of Service: 01/03/2019 9:00 AM Medical Record Number: [id number] Patient Account Number:  [id number] Date of Birth/Sex: 07-06-1932 (86 y.o. F) Treating RN: Harold Barban Primary Care Aleksei Goodlin: Paulita Cradle Other Clinician: Referring Steele Stracener: Paulita Cradle Treating Lugene Hitt/Extender: Melburn Hake, HOYT Weeks in Treatment: 1 Vital Signs Height(in): 65 Pulse(bpm): 47 Weight(lbs): 106 Blood Pressure(mmHg): 146/66 Body Mass Index(BMI): 18 Temperature(F): 98.1 Respiratory Rate 16 (breaths/min): Photos: [N/A:N/A] Wound Location: Left Lower Leg - Medial N/A N/A Wounding Event: Trauma N/A N/A Primary Etiology: Trauma, Other N/A N/A Comorbid History: Cataracts, Hypertension, N/A N/A Hypotension, Peripheral Venous Disease, Osteoarthritis, Received Chemotherapy, Received Radiation Date Acquired: 11/15/2018 N/A N/A Weeks of Treatment: 1 N/A N/A Wound Status: Open N/A N/A Measurements L x W x  D 1.6x0.9x0.1 N/A N/A (cm) Area (cm) : 1.131 N/A N/A Volume (cm) : 0.113 N/A N/A % Reduction in Area: 76.00% N/A N/A % Reduction in Volume: 76.00% N/A N/A Classification: Full Thickness Without N/A N/A Exposed Support Structures Exudate Amount: Small N/A N/A Exudate Type: Serous N/A N/A Exudate Color: amber N/A N/A Wound Margin: Flat and Intact N/A N/A Granulation Amount: Medium (34-66%) N/A N/A Granulation Quality: Pink N/A N/A Necrotic Amount: Medium (34-66%) N/A N/A Exposed Structures: Fat Layer (Subcutaneous N/A N/A Tissue) Exposed: Yes Beckles, Sowmya B. ([id number]) Fascia: No Tendon: No Muscle: No Joint: No Bone: No Epithelialization: Small (1-33%) N/A N/A Periwound Skin Texture: Excoriation: No N/A N/A Induration: No Callus: No Crepitus: No Rash: No Scarring: No Periwound Skin Moisture: Maceration: No N/A N/A Dry/Scaly: No Periwound Skin Color: Hemosiderin Staining: Yes N/A N/A Atrophie Blanche: No Cyanosis: No Ecchymosis: No Erythema: No Mottled: No Pallor: No Rubor: No Temperature: No Abnormality N/A N/A Tenderness on Palpation:  Yes N/A N/A Wound Preparation: Ulcer Cleansing: N/A N/A Rinsed/Irrigated with Saline Topical Anesthetic Applied: Other: lidocaine 4% Treatment Notes Electronic Signature(s) Signed: 01/03/2019 5:13:49 PM By: Harold Barban Entered By: Harold Barban on 01/03/2019 09:17:26 [name] ([id number]) -------------------------------------------------------------------------------- Multi-Disciplinary Care Plan Details Patient Name: [name] Date of Service: 01/03/2019 9:00 AM Medical Record Number: [id number] Patient Account Number: [id number] Date of Birth/Sex: 1932-02-07 (86 y.o. F) Treating RN: Harold Barban Primary Care Frederick Marro: Paulita Cradle Other Clinician: Referring Rudolfo Brandow: Paulita Cradle Treating Thimothy Barretta/Extender: Melburn Hake, HOYT Weeks in Treatment: 1 Active Inactive Wound/Skin Impairment Nursing Diagnoses: Impaired tissue integrity Knowledge deficit related to ulceration/compromised skin integrity Goals: Ulcer/skin breakdown will have a volume reduction of 30% by week 4 Date Initiated: 12/27/2018 Target Resolution Date: 01/27/2019 Goal Status: Active Interventions: Assess patient/caregiver ability to obtain necessary supplies Assess patient/caregiver ability to perform ulcer/skin care regimen upon admission and as needed Assess ulceration(s) every visit Notes: Electronic Signature(s) Signed: 01/03/2019 5:13:49 PM By: Harold Barban Entered By: Harold Barban on 01/03/2019 09:17:12 Maestas, Quay Burow ([id number]) -------------------------------------------------------------------------------- Pain Assessment Details Patient Name: [name] Date of Service: 01/03/2019 9:00 AM Medical Record Number: [id number] Patient Account Number: [id number] Date of Birth/Sex: 23-Feb-1932 (86 y.o. F) Treating RN: Secundino Ginger Primary Care Mahari Strahm: Paulita Cradle Other Clinician: Referring Ayda Tancredi: Paulita Cradle Treating  Floreen Teegarden/Extender: Melburn Hake, HOYT Weeks in Treatment: 1 Active Problems Location of Pain Severity and Description of Pain Patient Has Paino No Site Locations Pain Management and Medication Current Pain Management: Notes pt denies any pain at this time. Electronic Signature(s) Signed: 01/03/2019 1:48:10 PM By: Secundino Ginger Entered By: Secundino Ginger on 01/03/2019 09:00:14 [name] ([id number]) -------------------------------------------------------------------------------- Patient/Caregiver Education Details Patient Name: [name] Date of Service: 01/03/2019 9:00 AM Medical Record Number: [id number] Patient Account Number: [id number] Date of Birth/Gender: 1932/09/08 (86 y.o. F) Treating RN: Harold Barban Primary Care Physician: Paulita Cradle Other Clinician: Referring Physician: Paulita Cradle Treating Physician/Extender: Sharalyn Ink in Treatment: 1 Education Assessment Education Provided To: Patient Education Topics Provided Wound/Skin Impairment: Handouts: Caring for Your Ulcer Methods: Demonstration, Explain/Verbal Responses: State content correctly Electronic Signature(s) Signed: 01/03/2019 5:13:49 PM By: Harold Barban Entered By: Harold Barban on 01/03/2019 09:27:18 [name] ([id number]) -------------------------------------------------------------------------------- Wound Assessment Details Patient Name: [name] Date of Service: 01/03/2019 9:00 AM Medical Record Number: [id number] Patient Account Number: [id number] Date of Birth/Sex: February 28, 1932 (86 y.o. F) Treating RN: Secundino Ginger Primary Care Vi Biddinger: Paulita Cradle Other Clinician: Referring Jaaziel Peatross: Paulita Cradle Treating Kerline Trahan/Extender: Melburn Hake, HOYT Weeks  in Treatment: 1 Wound Status Wound Number: 1 Primary Trauma, Other Etiology: Wound Location: Left Lower Leg - Medial Wound Open Wounding Event: Trauma Status: Date Acquired:  11/15/2018 Comorbid Cataracts, Hypertension, Hypotension, Weeks Of Treatment: 1 History: Peripheral Venous Disease, Osteoarthritis, Clustered Wound: No Received Chemotherapy, Received Radiation Photos Photo Uploaded By: Secundino Ginger on 01/03/2019 09:13:03 Wound Measurements Length: (cm) 1.6 Width: (cm) 0.9 Depth: (cm) 0.1 Area: (cm) 1.131 Volume: (cm) 0.113 % Reduction in Area: 76% % Reduction in Volume: 76% Epithelialization: Small (1-33%) Tunneling: No Undermining: No Wound Description Full Thickness Without Exposed Support Foul Odo Classification: Structures Slough/F Wound Margin: Flat and Intact Exudate Small Amount: Exudate Type: Serous Exudate Color: amber r After Cleansing: No ibrino Yes Wound Bed Granulation Amount: Medium (34-66%) Exposed Structure Granulation Quality: Pink Fascia Exposed: No Necrotic Amount: Medium (34-66%) Fat Layer (Subcutaneous Tissue) Exposed: Yes Necrotic Quality: Adherent Slough Tendon Exposed: No Muscle Exposed: No Joint Exposed: No Bone Exposed: No Steenbergen, Danicia B. ([id number]) Periwound Skin Texture Texture Color No Abnormalities Noted: No No Abnormalities Noted: No Callus: No Atrophie Blanche: No Crepitus: No Cyanosis: No Excoriation: No Ecchymosis: No Induration: No Erythema: No Rash: No Hemosiderin Staining: Yes Scarring: No Mottled: No Pallor: No Moisture Rubor: No No Abnormalities Noted: No Dry / Scaly: No Temperature / Pain Maceration: No Temperature: No Abnormality Tenderness on Palpation: Yes Wound Preparation Ulcer Cleansing: Rinsed/Irrigated with Saline Topical Anesthetic Applied: Other: lidocaine 4%, Treatment Notes Wound #1 (Left, Medial Lower Leg) Notes xeroform, BFD Electronic Signature(s) Signed: 01/03/2019 1:48:10 PM By: Secundino Ginger Entered By: Secundino Ginger on 01/03/2019 09:08:12 [name]  ([id number]) -------------------------------------------------------------------------------- [place] Details Patient Name: [name] Date of Service: 01/03/2019 9:00 AM Medical Record Number: [id number] Patient Account Number: [id number] Date of Birth/Sex: 1932/04/28 (86 y.o. F) Treating RN: Secundino Ginger Primary Care Kyandre Okray: Paulita Cradle Other Clinician: Referring Cristopher Ciccarelli: Paulita Cradle Treating Tonesha Tsou/Extender: Melburn Hake, HOYT Weeks in Treatment: 1 Vital Signs Time Taken: 09:03 Temperature (F): 98.1 Height (in): 65 Pulse (bpm): 66 Weight (lbs): 106 Respiratory Rate (breaths/min): 16 Body Mass Index (BMI): 17.6 Blood Pressure (mmHg): 146/66 Reference Range: 80 - 120 mg / dl Electronic Signature(s) Signed: 01/03/2019 1:48:10 PM By: Secundino Ginger Entered BySecundino Ginger on 01/03/2019 09:05:31

## 2019-01-07 ENCOUNTER — Ambulatory Visit: Payer: Medicare Other

## 2019-01-07 ENCOUNTER — Other Ambulatory Visit: Payer: Medicare Other

## 2019-01-07 ENCOUNTER — Other Ambulatory Visit: Payer: Self-pay | Admitting: *Deleted

## 2019-01-07 ENCOUNTER — Ambulatory Visit: Payer: Medicare Other | Admitting: Hematology and Oncology

## 2019-01-07 DIAGNOSIS — C8200 Follicular lymphoma grade I, unspecified site: Secondary | ICD-10-CM

## 2019-01-10 ENCOUNTER — Encounter: Payer: Medicare Other | Admitting: Physician Assistant

## 2019-01-10 ENCOUNTER — Ambulatory Visit
Admission: RE | Admit: 2019-01-10 | Discharge: 2019-01-10 | Disposition: A | Discharge status: Home / Self Care | Payer: Medicare Other | Source: Ambulatory Visit | Attending: Radiation Oncology | Admitting: Radiation Oncology

## 2019-01-10 DIAGNOSIS — E11622 Type 2 diabetes mellitus with other skin ulcer: Secondary | ICD-10-CM | POA: Diagnosis not present

## 2019-01-11 ENCOUNTER — Ambulatory Visit
Admission: RE | Admit: 2019-01-11 | Discharge: 2019-01-11 | Disposition: A | Discharge status: Home / Self Care | Payer: Medicare Other | Source: Ambulatory Visit | Attending: Radiation Oncology | Admitting: Radiation Oncology

## 2019-01-11 DIAGNOSIS — Z510 Encounter for antineoplastic radiation therapy: Secondary | ICD-10-CM | POA: Diagnosis not present

## 2019-01-12 ENCOUNTER — Ambulatory Visit
Admission: RE | Admit: 2019-01-12 | Discharge: 2019-01-12 | Disposition: A | Discharge status: Home / Self Care | Payer: Medicare Other | Source: Ambulatory Visit | Attending: Radiation Oncology | Admitting: Radiation Oncology

## 2019-01-12 DIAGNOSIS — Z510 Encounter for antineoplastic radiation therapy: Secondary | ICD-10-CM | POA: Diagnosis not present

## 2019-01-13 ENCOUNTER — Ambulatory Visit
Admission: RE | Admit: 2019-01-13 | Discharge: 2019-01-13 | Disposition: A | Discharge status: Home / Self Care | Payer: Medicare Other | Source: Ambulatory Visit | Attending: Radiation Oncology | Admitting: Radiation Oncology

## 2019-01-13 DIAGNOSIS — Z510 Encounter for antineoplastic radiation therapy: Secondary | ICD-10-CM | POA: Diagnosis not present

## 2019-01-14 ENCOUNTER — Ambulatory Visit
Admission: RE | Admit: 2019-01-14 | Discharge: 2019-01-14 | Disposition: A | Discharge status: Home / Self Care | Payer: Medicare Other | Source: Ambulatory Visit | Attending: Radiation Oncology | Admitting: Radiation Oncology

## 2019-01-14 DIAGNOSIS — Z510 Encounter for antineoplastic radiation therapy: Secondary | ICD-10-CM | POA: Diagnosis not present

## 2019-01-16 NOTE — Progress Notes (Signed)
[name] ([id number]) Visit Report for 01/10/2019 Arrival Information Details Patient Name: [name], [name] Date of Service: 01/10/2019 9:00 AM Medical Record Number: [id number] Patient Account Number: [id number] Date of Birth/Sex: 1932-07-13 (86 y.o. F) Treating RN: [name] Primary Care [name]: [name] Other Clinician: Referring [name]: [name] Treating [name]/Extender: [name], [name]: 2 Visit Information History Since Last Visit Added or deleted any medications: No Patient Arrived: Ambulatory Any new allergies or adverse reactions: No Arrival Time: 09:10 Had a fall or experienced change in No Accompanied By: spouse activities of daily living that [name] affect Transfer Assistance: None risk of falls: Patient Identification Verified: Yes Signs or symptoms of abuse/neglect since last visito No Secondary Verification Process Completed: Yes Hospitalized since last visit: No Patient Has Alerts: Yes Implantable device outside of the clinic excluding No Patient Alerts: NOT Diabetic cellular tissue based products placed in the center since last visit: Has Dressing in Place as Prescribed: Yes Pain Present Now: No Electronic Signature(s) Signed: 01/10/2019 2:36:35 PM By: [name] [name] Entered By: [name] on 01/10/2019 09:10:40 [name] ([id number]) -------------------------------------------------------------------------------- Clinic Level of Care Assessment Details Patient Name: [name] Date of Service: 01/10/2019 9:00 AM Medical Record Number: [id number] Patient Account Number: [id number] Date of Birth/Sex: 29-Mar-1932 (86 y.o. F) Treating RN: [name] Primary Care [name]: [name] Other Clinician: Referring Tanyia Grabbe: [name] Treating Shakevia Sarris/Extender: [name], [name]: 2 Clinic Level of Care  Assessment Items TOOL 4 Quantity Score []  - Use when only an EandM is performed on FOLLOW-UP visit 0 ASSESSMENTS - Nursing Assessment / Reassessment []  - Reassessment of Co-morbidities (includes updates in patient status) 0 X- 1 5 Reassessment of Adherence to Sanchez Plan ASSESSMENTS - Wound and Skin Assessment / Reassessment []  - Simple Wound Assessment / Reassessment - one wound 0 []  - 0 Complex Wound Assessment / Reassessment - multiple wounds []  - 0 Dermatologic / Skin Assessment (not related to wound area) ASSESSMENTS - Focused Assessment []  - Circumferential Edema Measurements - multi extremities 0 []  - 0 Nutritional Assessment / Counseling / Intervention []  - 0 Lower Extremity Assessment (monofilament, tuning fork, pulses) []  - 0 Peripheral Arterial Disease Assessment (using hand held doppler) ASSESSMENTS - Ostomy and/or Continence Assessment and Care []  - Incontinence Assessment and Management 0 []  - 0 Ostomy Care Assessment and Management (repouching, etc.) PROCESS - Coordination of Care X - Simple Patient / Family Education for ongoing care 1 15 []  - 0 Complex (extensive) Patient / Family Education for ongoing care []  - 0 Staff obtains Programmer, systems, Records, Test Results / Process Orders []  - 0 Staff telephones HHA, Nursing Homes / Clarify orders / etc []  - 0 Routine Transfer to another Facility (non-emergent condition) []  - 0 Routine Hospital Admission (non-emergent condition) []  - 0 New Admissions / Biomedical engineer / Ordering NPWT, Apligraf, etc. []  - 0 Emergency Hospital Admission (emergent condition) X- 1 10 Simple Discharge Coordination Trevino, Sylvia B. ([id number]) []  - 0 Complex (extensive) Discharge Coordination PROCESS - Special Needs []  - Pediatric / Minor Patient Management 0 []  - 0 Isolation Patient Management []  - 0 Hearing / Language / Visual special needs []  - 0 Assessment of Community assistance (transportation, D/C planning,  etc.) []  - 0 Additional assistance / Altered mentation []  - 0 Support Surface(s) Assessment (bed, cushion, seat, etc.) INTERVENTIONS - Wound Cleansing / Measurement X - Simple Wound Cleansing - one wound 1 5 []  - 0  Complex Wound Cleansing - multiple wounds X- 1 5 Wound Imaging (photographs - any number of wounds) []  - 0 Wound Tracing (instead of photographs) X- 1 5 Simple Wound Measurement - one wound []  - 0 Complex Wound Measurement - multiple wounds INTERVENTIONS - Wound Dressings X - Small Wound Dressing one or multiple wounds 1 10 []  - 0 Medium Wound Dressing one or multiple wounds []  - 0 Large Wound Dressing one or multiple wounds []  - 0 Application of Medications - topical []  - 0 Application of Medications - injection INTERVENTIONS - Miscellaneous []  - External ear exam 0 []  - 0 Specimen Collection (cultures, biopsies, blood, body fluids, etc.) []  - 0 Specimen(s) / Culture(s) sent or taken to Lab for analysis []  - 0 Patient Transfer (multiple staff / Civil Service fast streamer / Similar devices) []  - 0 Simple Staple / Suture removal (25 or less) []  - 0 Complex Staple / Suture removal (26 or more) []  - 0 Hypo / Hyperglycemic Management (close monitor of Blood Glucose) []  - 0 Ankle / Brachial Index (ABI) - do not check if billed separately X- 1 5 Vital Signs Fetsch, Sylvia B. ([id number]) Has the patient been seen at the hospital within the last three years: Yes Total Score: 60 Level Of Care: New/Established - Level 2 Electronic Signature(s) Signed: 01/10/2019 4:45:46 PM By: [name], BSN, RN, CWS, [name] Entered By: [name], BSN, RN, CWS, Sylvia on 01/10/2019 09:31:20 [name] ([id number]) -------------------------------------------------------------------------------- Encounter Discharge Information Details Patient Name: [name] Date of Service: 01/10/2019 9:00 AM Medical Record Number: [id number] Patient Account Number: [id number] Date of  Birth/Sex: 08/18/1932 (86 y.o. F) Treating RN: [name] Primary Care Roald Lukacs: [name] Other Clinician: Referring Shahida Schnackenberg: [name] Treating Sharlynn Seckinger/Extender: Sylvia Keeler Weeks in Sanchez: 2 Encounter Discharge Information Items Discharge Condition: Stable Ambulatory Status: Ambulatory Discharge Destination: Home Transportation: Private Auto Accompanied By: self Schedule Follow-up Appointment: Yes Clinical Summary of Care: Electronic Signature(s) Signed: 01/10/2019 4:45:46 PM By: [name], BSN, RN, CWS, [name] Entered By: [name], BSN, RN, CWS, Sylvia on 01/10/2019 09:32:17 [name] ([id number]) -------------------------------------------------------------------------------- Lower Extremity Assessment Details Patient Name: [name] Date of Service: 01/10/2019 9:00 AM Medical Record Number: [id number] Patient Account Number: [id number] Date of Birth/Sex: [birth year] (86 y.o. F) Treating RN: Secundino Ginger Primary Care Diamonds Lippard: [name] Other Clinician: Referring Mollee Neer: [name] Treating Nature Vogelsang/Extender: [name], [name]: 2 Edema Assessment Assessed: [Left: No] [Right: No] [Left: Edema] [Right: :] Calf Left: Right: Point of Measurement: 30 cm From Medial Instep 30 cm cm Ankle Left: Right: Point of Measurement: 10 cm From Medial Instep 19 cm cm Vascular Assessment Claudication: Claudication Assessment [Left:None] Pulses: Posterior Tibial Extremity colors, hair growth, and conditions: Extremity Color: [Left:Hyperpigmented] Hair Growth on Extremity: [Left:No] Temperature of Extremity: [Left:Warm] Capillary Refill: [Left:< 3 seconds] Toe Nail Assessment Left: Right: Thick: No Discolored: No Deformed: No Improper Length and Hygiene: No Electronic Signature(s) Signed: 01/10/2019 2:52:40 PM By: Secundino Ginger Entered By: Secundino Ginger on 01/10/2019 09:20:39 Woessner, Quay Sanchez  ([id number]) -------------------------------------------------------------------------------- Multi Wound Chart Details Patient Name: [name] Date of Service: 01/10/2019 9:00 AM Medical Record Number: [id number] Patient Account Number: [id number] Date of Birth/Sex: Jun 30, 1932 (86 y.o. F) Treating RN: [name] Primary Care Nattalie Santiesteban: [name] Other Clinician: Referring Sharnetta Gielow: [name] Treating Mathan Darroch/Extender: [name], [name]: 2 Vital Signs Height(in): 65 Pulse(bpm): 66 Weight(lbs): 106 Blood Pressure(mmHg): 123/41 Body Mass Index(BMI): 18 Temperature(F): 98.3 Respiratory Rate 16 (breaths/min):  Photos: [1:No Photos] [N/A:N/A] Wound Location: [1:Left, Medial Lower Leg] [N/A:N/A] Wounding Event: [1:Trauma] [N/A:N/A] Primary Etiology: [1:Trauma, Other] [N/A:N/A] Comorbid History: [1:Cataracts, Hypertension, Hypotension, Peripheral Venous Disease, Osteoarthritis, Received Chemotherapy, Received Radiation] [N/A:N/A] Date Acquired: [1:11/15/2018] [N/A:N/A] Weeks of Sanchez: [1:2] [N/A:N/A] Wound Status: [1:Healed - Epithelialized] [N/A:N/A] Measurements L x W x D [1:0x0x0] [N/A:N/A] (cm) Area (cm) : [1:0] [N/A:N/A] Volume (cm) : [1:0] [N/A:N/A] % Reduction in Area: [1:100.00%] [N/A:N/A] % Reduction in Volume: [1:100.00%] [N/A:N/A] Classification: [1:Full Thickness Without Exposed Support Structures] [N/A:N/A] Exudate Amount: [1:None Present] [N/A:N/A] Wound Margin: [1:Flat and Intact] [N/A:N/A] Granulation Amount: [1:None Present (0%)] [N/A:N/A] Necrotic Amount: [1:None Present (0%)] [N/A:N/A] Exposed Structures: [1:Fat Layer (Subcutaneous Tissue) Exposed: Yes Fascia: No Tendon: No Muscle: No Joint: No Bone: No] [N/A:N/A] Epithelialization: [1:Small (1-33%)] [N/A:N/A] Periwound Skin Texture: [1:Excoriation: No Induration: No Callus: No Crepitus: No] [N/A:N/A] Rash: No Scarring: No Periwound Skin  Moisture: Maceration: No N/A N/A Dry/Scaly: No Periwound Skin Color: Hemosiderin Staining: Yes N/A N/A Atrophie Blanche: No Cyanosis: No Ecchymosis: No Erythema: No Mottled: No Pallor: No Rubor: No Temperature: No Abnormality N/A N/A Tenderness on Palpation: No N/A N/A Wound Preparation: Ulcer Cleansing: N/A N/A Rinsed/Irrigated with Saline Topical Anesthetic Applied: Other: lidocaine 4% Sanchez Notes Electronic Signature(s) Signed: 01/10/2019 4:45:46 PM By: [name], BSN, RN, CWS, [name] Entered By: [name], BSN, RN, CWS, Sylvia on 01/10/2019 09:27:53 JILLANA, SELPH ([id number]) -------------------------------------------------------------------------------- Multi-Disciplinary Care Plan Details Patient Name: [name] Date of Service: 01/10/2019 9:00 AM Medical Record Number: [id number] Patient Account Number: [id number] Date of Birth/Sex: October 22, 1932 (86 y.o. F) Treating RN: [name] Primary Care Anaria Kroner: [name] Other Clinician: Referring Billi Bright: [name] Treating Dedrick Heffner/Extender: Sharalyn Ink in Sanchez: 2 Active Inactive Electronic Signature(s) Signed: 01/10/2019 4:45:46 PM By: [name], BSN, RN, CWS, [name] Entered By: [name], BSN, RN, CWS, Sylvia on 01/10/2019 09:27:43 [name] ([id number]) -------------------------------------------------------------------------------- Pain Assessment Details Patient Name: [name] Date of Service: 01/10/2019 9:00 AM Medical Record Number: [id number] Patient Account Number: [id number] Date of Birth/Sex: June 10, 1932 (86 y.o. F) Treating RN: [name] Primary Care Teresita Fanton: [name] Other Clinician: Referring Fayette Gasner: [name] Treating Ajna Moors/Extender: [name], [name]: 2 Active Problems Location of Pain Severity and Description of Pain Patient Has Paino No Site Locations Pain Management and Medication Current  Pain Management: Electronic Signature(s) Signed: 01/10/2019 2:36:35 PM By: [name] [name] Signed: 01/10/2019 4:45:46 PM By: [name], BSN, RN, CWS, [name] Entered By: [name] on 01/10/2019 09:10:46 [name] ([id number]) -------------------------------------------------------------------------------- Patient/Caregiver Education Details Patient Name: [name] Date of Service: 01/10/2019 9:00 AM Medical Record Number: [id number] Patient Account Number: [id number] Date of Birth/Gender: 1931/12/27 (86 y.o. F) Treating RN: [name] Primary Care Physician: [name] Other Clinician: Referring Physician: Paulita Sanchez Treating Physician/Extender: Sharalyn Ink in Sanchez: 2 Education Assessment Education Provided To: Patient Education Topics Provided Wound/Skin Impairment: Handouts: Caring for Your Ulcer, Other: discharge Sanchez complete Methods: Demonstration, Explain/Verbal Responses: State content correctly Electronic Signature(s) Signed: 01/10/2019 4:45:46 PM By: [name], BSN, RN, CWS, [name] Entered By: [name], BSN, RN, CWS, Sylvia on 01/10/2019 09:32:01 [name] ([id number]) -------------------------------------------------------------------------------- Wound Assessment Details Patient Name: [name] Date of Service: 01/10/2019 9:00 AM Medical Record Number: [id number] Patient Account Number: [id number] Date of Birth/Sex: November 30, 1932 (86 y.o. F) Treating RN: [name] Primary Care Attila Mccarthy: [name] Other Clinician: Referring Treyveon Mochizuki: [name] Treating Hellon Vaccarella/Extender: [name], Sylvia Weeks in  Sanchez: 2 Wound Status Wound Number: 1 Primary Trauma, Other Etiology: Wound Location: Left, Medial Lower Leg Wound Healed - Epithelialized Wounding Event: Trauma Status: Date Acquired: 11/15/2018 Comorbid Cataracts, Hypertension,  Hypotension, Weeks Of Sanchez: 2 History: Peripheral Venous Disease, Osteoarthritis, Clustered Wound: No Received Chemotherapy, Received Radiation Photos Photo Uploaded By: Secundino Ginger on 01/10/2019 10:59:08 Wound Measurements Length: (cm) 0 % Reduc Width: (cm) 0 % Reduc Depth: (cm) 0 Epithel Area: (cm) 0 Tunnel Volume: (cm) 0 Underm tion in Area: 100% tion in Volume: 100% ialization: Small (1-33%) ing: No ining: No Wound Description Full Thickness Without Exposed Support Foul Od Classification: Structures Slough/ Wound Margin: Flat and Intact Exudate None Present Amount: or After Cleansing: No Fibrino Yes Wound Bed Granulation Amount: None Present (0%) Exposed Structure Necrotic Amount: None Present (0%) Fascia Exposed: No Fat Layer (Subcutaneous Tissue) Exposed: Yes Tendon Exposed: No Muscle Exposed: No Joint Exposed: No Bone Exposed: No Periwound Skin Texture Degrasse, Giulietta B. ([id number]) Texture Color No Abnormalities Noted: No No Abnormalities Noted: No Callus: No Atrophie Blanche: No Crepitus: No Cyanosis: No Excoriation: No Ecchymosis: No Induration: No Erythema: No Rash: No Hemosiderin Staining: Yes Scarring: No Mottled: No Pallor: No Moisture Rubor: No No Abnormalities Noted: No Dry / Scaly: No Temperature / Pain Maceration: No Temperature: No Abnormality Wound Preparation Ulcer Cleansing: Rinsed/Irrigated with Saline Topical Anesthetic Applied: Other: lidocaine 4%, Electronic Signature(s) Signed: 01/10/2019 4:45:46 PM By: [name], BSN, RN, CWS, [name] Entered By: [name], BSN, RN, CWS, Sylvia on 01/10/2019 09:27:34 KHELANI, KOPS ([id number]) -------------------------------------------------------------------------------- Vitals Details Patient Name: [name] Date of Service: 01/10/2019 9:00 AM Medical Record Number: [id number] Patient Account Number: [id number] Date of Birth/Sex: 1932-04-29 (86 y.o. F) Treating  RN: [name] Primary Care Bayyinah Dukeman: [name] Other Clinician: Referring Khianna Blazina: [name] Treating Doyal Saric/Extender: [name], [name]: 2 Vital Signs Time Taken: 09:10 Temperature (F): 98.3 Height (in): 65 Pulse (bpm): 66 Weight (lbs): 106 Respiratory Rate (breaths/min): 16 Body Mass Index (BMI): 17.6 Blood Pressure (mmHg): 123/41 Reference Range: 80 - 120 mg / dl Airway Electronic Signature(s) Signed: 01/10/2019 2:36:35 PM By: [name] [name] Entered By: [name] on 01/10/2019 09:13:16

## 2019-01-17 ENCOUNTER — Ambulatory Visit
Admission: RE | Admit: 2019-01-17 | Discharge: 2019-01-17 | Disposition: A | Discharge status: Home / Self Care | Payer: Medicare Other | Source: Ambulatory Visit | Attending: Radiation Oncology | Admitting: Radiation Oncology

## 2019-01-17 DIAGNOSIS — Z510 Encounter for antineoplastic radiation therapy: Secondary | ICD-10-CM | POA: Diagnosis not present

## 2019-01-17 DIAGNOSIS — C8293 Follicular lymphoma, unspecified, intra-abdominal lymph nodes: Secondary | ICD-10-CM | POA: Insufficient documentation

## 2019-01-18 ENCOUNTER — Inpatient Hospital Stay: Payer: Medicare Other | Attending: Radiation Oncology

## 2019-01-18 ENCOUNTER — Ambulatory Visit
Admission: RE | Admit: 2019-01-18 | Discharge: 2019-01-18 | Disposition: A | Discharge status: Home / Self Care | Payer: Medicare Other | Source: Ambulatory Visit | Attending: Radiation Oncology | Admitting: Radiation Oncology

## 2019-01-18 ENCOUNTER — Other Ambulatory Visit: Payer: Self-pay | Admitting: Urgent Care

## 2019-01-18 DIAGNOSIS — C8288 Other types of follicular lymphoma, lymph nodes of multiple sites: Secondary | ICD-10-CM | POA: Diagnosis not present

## 2019-01-18 DIAGNOSIS — C8200 Follicular lymphoma grade I, unspecified site: Secondary | ICD-10-CM

## 2019-01-18 DIAGNOSIS — Z510 Encounter for antineoplastic radiation therapy: Secondary | ICD-10-CM | POA: Diagnosis not present

## 2019-01-18 LAB — CBC
HCT: 32.4 % — ABNORMAL LOW (ref 36.0–46.0)
Hemoglobin: 10.6 g/dL — ABNORMAL LOW (ref 12.0–15.0)
MCH: 32 pg (ref 26.0–34.0)
MCHC: 32.7 g/dL (ref 30.0–36.0)
MCV: 97.9 fL (ref 80.0–100.0)
Platelets: 261 K/uL (ref 150–400)
RBC: 3.31 MIL/uL — ABNORMAL LOW (ref 3.87–5.11)
RDW: 13.8 % (ref 11.5–15.5)
WBC: 7.9 K/uL (ref 4.0–10.5)
nRBC: 0 % (ref 0.0–0.2)

## 2019-01-19 ENCOUNTER — Ambulatory Visit
Admission: RE | Admit: 2019-01-19 | Discharge: 2019-01-19 | Disposition: A | Discharge status: Home / Self Care | Payer: Medicare Other | Source: Ambulatory Visit | Attending: Radiation Oncology | Admitting: Radiation Oncology

## 2019-01-19 DIAGNOSIS — Z510 Encounter for antineoplastic radiation therapy: Secondary | ICD-10-CM | POA: Diagnosis not present

## 2019-01-20 ENCOUNTER — Ambulatory Visit
Admission: RE | Admit: 2019-01-20 | Discharge: 2019-01-20 | Disposition: A | Discharge status: Home / Self Care | Payer: Medicare Other | Source: Ambulatory Visit | Attending: Radiation Oncology | Admitting: Radiation Oncology

## 2019-01-20 DIAGNOSIS — Z510 Encounter for antineoplastic radiation therapy: Secondary | ICD-10-CM | POA: Diagnosis not present

## 2019-01-21 ENCOUNTER — Ambulatory Visit
Admission: RE | Admit: 2019-01-21 | Discharge: 2019-01-21 | Disposition: A | Discharge status: Home / Self Care | Payer: Medicare Other | Source: Ambulatory Visit | Attending: Radiation Oncology | Admitting: Radiation Oncology

## 2019-01-21 DIAGNOSIS — Z510 Encounter for antineoplastic radiation therapy: Secondary | ICD-10-CM | POA: Diagnosis not present

## 2019-01-24 ENCOUNTER — Ambulatory Visit
Admission: RE | Admit: 2019-01-24 | Discharge: 2019-01-24 | Disposition: A | Discharge status: Home / Self Care | Payer: Medicare Other | Source: Ambulatory Visit | Attending: Radiation Oncology | Admitting: Radiation Oncology

## 2019-01-24 DIAGNOSIS — Z510 Encounter for antineoplastic radiation therapy: Secondary | ICD-10-CM | POA: Diagnosis not present

## 2019-01-25 ENCOUNTER — Ambulatory Visit
Admission: RE | Admit: 2019-01-25 | Discharge: 2019-01-25 | Disposition: A | Discharge status: Home / Self Care | Payer: Medicare Other | Source: Ambulatory Visit | Attending: Radiation Oncology | Admitting: Radiation Oncology

## 2019-01-25 ENCOUNTER — Inpatient Hospital Stay: Payer: Medicare Other

## 2019-01-25 DIAGNOSIS — Z510 Encounter for antineoplastic radiation therapy: Secondary | ICD-10-CM | POA: Diagnosis not present

## 2019-01-25 DIAGNOSIS — C8288 Other types of follicular lymphoma, lymph nodes of multiple sites: Secondary | ICD-10-CM | POA: Diagnosis not present

## 2019-01-25 DIAGNOSIS — C8200 Follicular lymphoma grade I, unspecified site: Secondary | ICD-10-CM

## 2019-01-25 LAB — CBC
HCT: 31.6 % — ABNORMAL LOW (ref 36.0–46.0)
HEMOGLOBIN: 10.5 g/dL — AB (ref 12.0–15.0)
MCH: 32.4 pg (ref 26.0–34.0)
MCHC: 33.2 g/dL (ref 30.0–36.0)
MCV: 97.5 fL (ref 80.0–100.0)
NRBC: 0 % (ref 0.0–0.2)
Platelets: 213 10*3/uL (ref 150–400)
RBC: 3.24 MIL/uL — ABNORMAL LOW (ref 3.87–5.11)
RDW: 13.9 % (ref 11.5–15.5)
WBC: 7.1 10*3/uL (ref 4.0–10.5)

## 2019-01-26 ENCOUNTER — Ambulatory Visit
Admission: RE | Admit: 2019-01-26 | Discharge: 2019-01-26 | Disposition: A | Discharge status: Home / Self Care | Payer: Medicare Other | Source: Ambulatory Visit | Attending: Radiation Oncology | Admitting: Radiation Oncology

## 2019-01-26 DIAGNOSIS — Z510 Encounter for antineoplastic radiation therapy: Secondary | ICD-10-CM | POA: Diagnosis not present

## 2019-01-27 ENCOUNTER — Ambulatory Visit
Admission: RE | Admit: 2019-01-27 | Discharge: 2019-01-27 | Disposition: A | Discharge status: Home / Self Care | Payer: Medicare Other | Source: Ambulatory Visit | Attending: Radiation Oncology | Admitting: Radiation Oncology

## 2019-01-27 DIAGNOSIS — Z510 Encounter for antineoplastic radiation therapy: Secondary | ICD-10-CM | POA: Diagnosis not present

## 2019-01-28 ENCOUNTER — Ambulatory Visit
Admission: RE | Admit: 2019-01-28 | Discharge: 2019-01-28 | Disposition: A | Discharge status: Home / Self Care | Payer: Medicare Other | Source: Ambulatory Visit | Attending: Radiation Oncology | Admitting: Radiation Oncology

## 2019-01-28 DIAGNOSIS — Z510 Encounter for antineoplastic radiation therapy: Secondary | ICD-10-CM | POA: Diagnosis not present

## 2019-01-31 ENCOUNTER — Ambulatory Visit
Admission: RE | Admit: 2019-01-31 | Discharge: 2019-01-31 | Disposition: A | Discharge status: Home / Self Care | Payer: Medicare Other | Source: Ambulatory Visit | Attending: Radiation Oncology | Admitting: Radiation Oncology

## 2019-01-31 DIAGNOSIS — Z510 Encounter for antineoplastic radiation therapy: Secondary | ICD-10-CM | POA: Diagnosis not present

## 2019-02-03 NOTE — Progress Notes (Signed)
[name], [name] ([id number]) Visit Report for 01/10/2019 Chief Complaint Document Details Patient Name: [name] Date of Service: 01/10/2019 9:00 AM Medical Record Number: [id number] Patient Account Number: [id number] Date of Birth/Sex: [birth year] (85 y.o. F) Treating RN: Cornell Barman Primary Care Provider: Paulita Cradle Other Clinician: Referring Provider: Paulita Cradle Treating Provider/Extender: Melburn Hake, HOYT Weeks in Treatment: 2 Information Obtained from: Patient Chief Complaint Left LE ulcer Electronic Signature(s) Signed: 01/14/2019 9:05:33 PM By: Worthy Keeler PA-C Entered By: Worthy Keeler on 01/10/2019 09:25:51 [name], [name] -------------------------------------------------------------------------------- HPI Details Patient Name: [name] Date of Service: 01/10/2019 9:00 AM Medical Record Number: [id number] Patient Account Number: [id number] Date of Birth/Sex: 1932-11-13 (85 y.o. F) Treating RN: Cornell Barman Primary Care Provider: Paulita Cradle Other Clinician: Referring Provider: Paulita Cradle Treating Provider/Extender: Melburn Hake, HOYT Weeks in Treatment: 2 History of Present Illness HPI Description: 12/27/18 on evaluation today patient presents for initial presentation our clinic concerning an injury which occurred to her left lower leg around the first week of December when she hit her leg on a box of Christmas lights. She states that she is very thin skin and bruises easily and subsequently this formed a blister on rate the skin which subsequently opened. Since that time she has been attempting to treat this without good success. At one point it sounds as if someone put alginate on the area although it became stocking was pulling off her skin therefore she avoided that and went to just utilizing Vaseline. This has not been the worst thing for her as things seem to be doing fairly well at this point in  time. She is currently getting ready to undergo radiation therapy for Hodgkin lymphoma. Otherwise she has no other major medical problems that would affect wound healing at this time. No fevers, chills, nausea, or vomiting noted at this time. 01/03/19 on evaluation today patient appears to be doing much better in regard to her lower Trinity ulcer. She's been tolerating the dressing changes without complication. Fortunately there's no signs of infection at this time which is also good news. Overall I'm very pleased with the progress she has made in just one week. 01/10/19 on evaluation today patient actually appears to be doing excellent in fact she seems to be completely healed in regard to her wound. She has tolerated the dressings without complication and fortunately there appears to be no signs of infection residual. Overall very pleased with how things have progressed over this left lower extremity. Electronic Signature(s) Signed: 01/14/2019 9:05:33 PM By: Worthy Keeler PA-C Entered By: Worthy Keeler on 01/14/2019 19:58:26 [name], [name] ([id number]) -------------------------------------------------------------------------------- Physical Exam Details Patient Name: [name] Date of Service: 01/10/2019 9:00 AM Medical Record Number: [id number] Patient Account Number: [id number] Date of Birth/Sex: [birth year] (85 y.o. F) Treating RN: Cornell Barman Primary Care Provider: Paulita Cradle Other Clinician: Referring Provider: Paulita Cradle Treating Provider/Extender: Melburn Hake, HOYT Weeks in Treatment: 2 Constitutional Well-nourished and well-hydrated in no acute distress. Respiratory normal breathing without difficulty. clear to auscultation bilaterally. Cardiovascular regular rate and rhythm with normal S1, S2. Psychiatric this patient is able to make decisions and demonstrates good insight into disease process. Alert and Oriented x 3. pleasant and  cooperative. Notes Upon inspection today patient's wound bed shows complete epithelialization which is excellent news. Overall I do believe she has tolerated the dressing as well and overall to I feel like her wound progress quite nicely and quickly. Electronic Signature(s) Signed: 01/14/2019 9:05:33  PM By: Worthy Keeler PA-C Entered By: Worthy Keeler on 01/14/2019 19:58:50 [name] ([id number]) -------------------------------------------------------------------------------- Physician Orders Details Patient Name: [name] Date of Service: 01/10/2019 9:00 AM Medical Record Number: [id number] Patient Account Number: [id number] Date of Birth/Sex: [birth year] (85 y.o. F) Treating RN: Cornell Barman Primary Care Provider: Paulita Cradle Other Clinician: Referring Provider: Paulita Cradle Treating Provider/Extender: Melburn Hake, HOYT Weeks in Treatment: 2 Verbal / Phone Orders: No Diagnosis Coding ICD-10 Coding Code Description S81.802A Unspecified open wound, left lower leg, initial encounter L97.822 Non-pressure chronic ulcer of other part of left lower leg with fat layer exposed C81.70 Other Hodgkin lymphoma, unspecified site Primary Wound Dressing o Xeroform Secondary Dressing o Other - Coverlet Discharge From [facility] Services o Discharge from [facility] complete Electronic Signature(s) Signed: 01/10/2019 4:45:46 PM By: Gretta Cool, BSN, RN, CWS, Kim RN, BSN Signed: 01/14/2019 9:05:33 PM By: Worthy Keeler PA-C Entered By: Gretta Cool, BSN, RN, CWS, Kim on 01/10/2019 09:28:32 [name], [name] ([id number]) -------------------------------------------------------------------------------- Problem List Details Patient Name: [name] Date of Service: 01/10/2019 9:00 AM Medical Record Number: [id number] Patient Account Number: [id number] Date of Birth/Sex: [birth year] (85 y.o. F) Treating RN: Cornell Barman Primary Care Provider:  Paulita Cradle Other Clinician: Referring Provider: Paulita Cradle Treating Provider/Extender: Melburn Hake, HOYT Weeks in Treatment: 2 Active Problems ICD-10 Evaluated Encounter Code Description Active Date Today Diagnosis S81.802A Unspecified open wound, left lower leg, initial encounter 12/27/2018 No Yes L97.822 Non-pressure chronic ulcer of other part of left lower leg with 12/27/2018 No Yes fat layer exposed C81.70 Other Hodgkin lymphoma, unspecified site 12/27/2018 No Yes Inactive Problems Resolved Problems Electronic Signature(s) Signed: 01/14/2019 9:05:33 PM By: Worthy Keeler PA-C Entered By: Worthy Keeler on 01/10/2019 09:25:47 [name], [name] ([id number]) -------------------------------------------------------------------------------- Progress Note Details Patient Name: [name] Date of Service: 01/10/2019 9:00 AM Medical Record Number: [id number] Patient Account Number: [id number] Date of Birth/Sex: 1932/05/20 (86 y.o. F) Treating RN: Cornell Barman Primary Care Provider: Paulita Cradle Other Clinician: Referring Provider: Paulita Cradle Treating Provider/Extender: Melburn Hake, HOYT Weeks in Treatment: 2 Subjective Chief Complaint Information obtained from Patient Left LE ulcer History of Present Illness (HPI) 12/27/18 on evaluation today patient presents for initial presentation our clinic concerning an injury which occurred to her left lower leg around the first week of December when she hit her leg on a box of Christmas lights. She states that she is very thin skin and bruises easily and subsequently this formed a blister on rate the skin which subsequently opened. Since that time she has been attempting to treat this without good success. At one point it sounds as if someone put alginate on the area although it became stocking was pulling off her skin therefore she avoided that and went to just utilizing Vaseline. This has not been the worst  thing for her as things seem to be doing fairly well at this point in time. She is currently getting ready to undergo radiation therapy for Hodgkin lymphoma. Otherwise she has no other major medical problems that would affect wound healing at this time. No fevers, chills, nausea, or vomiting noted at this time. 01/03/19 on evaluation today patient appears to be doing much better in regard to her lower Trinity ulcer. She's been tolerating the dressing changes without complication. Fortunately there's no signs of infection at this time which is also good news. Overall I'm very pleased with the progress she has made in just one week. 01/10/19 on evaluation  today patient actually appears to be doing excellent in fact she seems to be completely healed in regard to her wound. She has tolerated the dressings without complication and fortunately there appears to be no signs of infection residual. Overall very pleased with how things have progressed over this left lower extremity. Patient History Information obtained from Patient. Family History Cancer - Father,Siblings, Heart Disease - Father, Hypertension - Father, Stroke - Father, No family history of Diabetes, Kidney Disease, Lung Disease, Seizures, Thyroid Problems, Tuberculosis. Social History Never smoker, Marital Status - Married, Alcohol Use - Rarely, Drug Use - No History, Caffeine Use - Rarely. Medical History Eyes Patient has history of Cataracts - removed Denies history of Glaucoma, Optic Neuritis Ear/Nose/Mouth/Throat Denies history of Chronic sinus problems/congestion, Middle ear problems Hematologic/Lymphatic Denies history of Anemia, Hemophilia, Human Immunodeficiency Virus, Lymphedema, Sickle Cell Disease Respiratory Denies history of Aspiration, Asthma, Chronic Obstructive Pulmonary Disease (COPD), Pneumothorax, Sleep Apnea, Tuberculosis Cardiovascular Patient has history of Hypertension, Hypotension, Peripheral Venous  Disease [name], Sylvia B. ([id number]) Denies history of Angina, Arrhythmia, Congestive Heart Failure, Coronary Artery Disease, Deep Vein Thrombosis, Myocardial Infarction, Peripheral Arterial Disease, Phlebitis, Vasculitis Gastrointestinal Denies history of Cirrhosis , Colitis, Crohn s, Hepatitis A, Hepatitis B, Hepatitis C Endocrine Denies history of Type I Diabetes, Type II Diabetes Genitourinary Denies history of End Stage Renal Disease Immunological Denies history of Lupus Erythematosus, Raynaud s, Scleroderma Integumentary (Skin) Denies history of History of Burn, History of pressure wounds Musculoskeletal Patient has history of Osteoarthritis - Knee right Denies history of Gout, Rheumatoid Arthritis, Osteomyelitis Neurologic Denies history of Dementia, Neuropathy, Quadriplegia, Paraplegia, Seizure Disorder Oncologic Patient has history of Received Chemotherapy - Last year, Received Radiation Psychiatric Denies history of Anorexia/bulimia, Confinement Anxiety Medical And Surgical History Notes Constitutional Symptoms (General Health) Hodgin's Lymphoma (starting Radiation); Eyes Macular Degeneration Review of Systems (ROS) Constitutional Symptoms (General Health) Denies complaints or symptoms of Fever, Chills. Respiratory The patient has no complaints or symptoms. Cardiovascular The patient has no complaints or symptoms. Psychiatric The patient has no complaints or symptoms. Objective Constitutional Well-nourished and well-hydrated in no acute distress. Vitals Time Taken: 9:10 AM, Height: 65 in, Weight: 106 lbs, BMI: 17.6, Temperature: 98.3 F, Pulse: 66 bpm, Respiratory Rate: 16 breaths/min, Blood Pressure: 123/41 mmHg. Respiratory normal breathing without difficulty. clear to auscultation bilaterally. Cardiovascular [name], [name] ([id number]) regular rate and rhythm with normal S1, S2. Psychiatric this patient is able to make decisions and  demonstrates good insight into disease process. Alert and Oriented x 3. pleasant and cooperative. General Notes: Upon inspection today patient's wound bed shows complete epithelialization which is excellent news. Overall I do believe she has tolerated the dressing as well and overall to I feel like her wound progress quite nicely and quickly. Integumentary (Hair, Skin) Wound #1 status is Healed - Epithelialized. Original cause of wound was Trauma. The wound is located on the Left,Medial Lower Leg. The wound measures 0cm length x 0cm width x 0cm depth; 0cm^2 area and 0cm^3 volume. There is Fat Layer (Subcutaneous Tissue) Exposed exposed. There is no tunneling or undermining noted. There is a none present amount of drainage noted. The wound margin is flat and intact. There is no granulation within the wound bed. There is no necrotic tissue within the wound bed. The periwound skin appearance exhibited: Hemosiderin Staining. The periwound skin appearance did not exhibit: Callus, Crepitus, Excoriation, Induration, Rash, Scarring, Dry/Scaly, Maceration, Atrophie Blanche, Cyanosis, Ecchymosis, Mottled, Pallor, Rubor, Erythema. Periwound temperature was noted as No Abnormality. Assessment Active  Problems ICD-10 Unspecified open wound, left lower leg, initial encounter Non-pressure chronic ulcer of other part of left lower leg with fat layer exposed Other Hodgkin lymphoma, unspecified site Plan Primary Wound Dressing: Xeroform Secondary Dressing: Other - Coverlet Discharge From [facility] Services: Discharge from [facility] complete My suggestion currently is gonna be that we go ahead discontinue wound care services that she is completely healed. The patient is in agreement with plan. We will see were things stand at follow-up as needed in the future if anything changes or worsens she will let me know. She will keep it covered for protection for just a little bit longer 1-2  weeks. Electronic Signature(s) Signed: 01/14/2019 9:05:33 PM By: Worthy Keeler PA-C Entered By: Worthy Keeler on 01/14/2019 19:59:17 [name], [name] ([id number]) [name], [name] ([id number]) -------------------------------------------------------------------------------- ROS/PFSH Details Patient Name: [name] Date of Service: 01/10/2019 9:00 AM Medical Record Number: [id number] Patient Account Number: [id number] Date of Birth/Sex: [birth year] (86 y.o. F) Treating RN: Cornell Barman Primary Care Provider: Paulita Cradle Other Clinician: Referring Provider: Paulita Cradle Treating Provider/Extender: Melburn Hake, HOYT Weeks in Treatment: 2 Information Obtained From Patient Wound History Do you currently have one or more open woundso Yes How many open wounds do you currently haveo 1 Approximately how long have you had your woundso 1.5 months How have you been treating your wound(s) until nowo vasaline Has your wound(s) ever healed and then re-openedo No Have you had any lab work done in the past montho Yes Who ordered the lab work [facility] Have you tested positive for an antibiotic resistant organism (MRSA, VRE)o No Have you tested positive for osteomyelitis (bone infection)o No Have you had any tests for circulation on your legso No Constitutional Symptoms (General Health) Complaints and Symptoms: Negative for: Fever; Chills Medical History: Past Medical History Notes: Hodgin's Lymphoma (starting Radiation); Eyes Medical History: Positive for: Cataracts - removed Negative for: Glaucoma; Optic Neuritis Past Medical History Notes: Macular Degeneration Ear/Nose/Mouth/Throat Medical History: Negative for: Chronic sinus problems/congestion; Middle ear problems Hematologic/Lymphatic Medical History: Negative for: Anemia; Hemophilia; Human Immunodeficiency Virus; Lymphedema; Sickle Cell Disease Respiratory Complaints and Symptoms: No  Complaints or Symptoms Medical History: Negative for: Aspiration; Asthma; Chronic Obstructive Pulmonary Disease (COPD); Pneumothorax; Sleep Apnea; [place] ([id number]) Tuberculosis Cardiovascular Complaints and Symptoms: No Complaints or Symptoms Medical History: Positive for: Hypertension; Hypotension; Peripheral Venous Disease Negative for: Angina; Arrhythmia; Congestive Heart Failure; Coronary Artery Disease; Deep Vein Thrombosis; Myocardial Infarction; Peripheral Arterial Disease; Phlebitis; Vasculitis Gastrointestinal Medical History: Negative for: Cirrhosis ; Colitis; Crohnos; Hepatitis A; Hepatitis B; Hepatitis C Endocrine Medical History: Negative for: Type I Diabetes; Type II Diabetes Genitourinary Medical History: Negative for: End Stage Renal Disease Immunological Medical History: Negative for: Lupus Erythematosus; Raynaudos; Scleroderma Integumentary (Skin) Medical History: Negative for: History of Burn; History of pressure wounds Musculoskeletal Medical History: Positive for: Osteoarthritis - Knee right Negative for: Gout; Rheumatoid Arthritis; Osteomyelitis Neurologic Medical History: Negative for: Dementia; Neuropathy; Quadriplegia; Paraplegia; Seizure Disorder Oncologic Medical History: Positive for: Received Chemotherapy - Last year; Received Radiation Psychiatric Complaints and Symptoms: No Complaints or Symptoms Medical History: [name], Sylvia B. ([id number]) Negative for: Anorexia/bulimia; Confinement Anxiety HBO Extended History Items Eyes: Cataracts Immunizations Pneumococcal Vaccine: Received Pneumococcal Vaccination: Yes Implantable Devices Family and Social History Cancer: Yes - Father,Siblings; Diabetes: No; Heart Disease: Yes - Father; Hypertension: Yes - Father; Kidney Disease: No; Lung Disease: No; Seizures: No; Stroke: Yes - Father; Thyroid Problems: No; Tuberculosis: No; Never smoker; Marital  Status - Married; Alcohol  Use: Rarely; Drug Use: No History; Caffeine Use: Rarely; Financial Concerns: No; Food, Clothing or Shelter Needs: No; Support System Lacking: No; Transportation Concerns: No; Advanced Directives: Yes (Not Provided); Patient does not want information on Advanced Directives; Living Will: Yes (Not Provided); Medical Power of Attorney: Yes (Not Provided) Physician Affirmation I have reviewed and agree with the above information. Electronic Signature(s) Signed: 01/14/2019 9:05:33 PM By: Worthy Keeler PA-C Signed: 02/03/2019 7:49:08 AM By: Gretta Cool BSN, RN, CWS, Kim RN, BSN Entered By: Worthy Keeler on 01/14/2019 19:58:39 [name], [name] ([id number]) -------------------------------------------------------------------------------- SuperBill Details Patient Name: [name] Date of Service: 01/10/2019 Medical Record Number: [id number] Patient Account Number: [id number] Date of Birth/Sex: 11-26-1932 (86 y.o. F) Treating RN: Cornell Barman Primary Care Provider: Paulita Cradle Other Clinician: Referring Provider: Paulita Cradle Treating Provider/Extender: Melburn Hake, HOYT Weeks in Treatment: 2 Diagnosis Coding ICD-10 Codes Code Description S81.802A Unspecified open wound, left lower leg, initial encounter L97.822 Non-pressure chronic ulcer of other part of left lower leg with fat layer exposed C81.70 Other Hodgkin lymphoma, unspecified site Facility Procedures CPT4 Code: [id number] Description: [phone] - WOUND CARE VISIT-LEV 2 EST PT Modifier: Quantity: 1 Physician Procedures CPT4 Code Description: [id number] - WC PHYS LEVEL 2 - EST PT ICD-10 Diagnosis Description S81.802A Unspecified open wound, left lower leg, initial encounter L97.822 Non-pressure chronic ulcer of other part of left lower leg wit C81.70 Other Hodgkin  lymphoma, unspecified site Modifier: h fat layer expos Quantity: 1 ed Electronic Signature(s) Signed: 01/14/2019 9:05:33 PM By: Worthy Keeler  PA-C Entered By: Worthy Keeler on 01/10/2019 23:40:48

## 2019-03-01 ENCOUNTER — Other Ambulatory Visit: Payer: Self-pay

## 2019-03-02 ENCOUNTER — Other Ambulatory Visit: Payer: Self-pay

## 2019-03-02 ENCOUNTER — Encounter: Payer: Self-pay | Admitting: Radiation Oncology

## 2019-03-02 ENCOUNTER — Other Ambulatory Visit: Payer: Self-pay | Admitting: *Deleted

## 2019-03-02 ENCOUNTER — Ambulatory Visit
Admission: RE | Admit: 2019-03-02 | Discharge: 2019-03-02 | Disposition: A | Discharge status: Home / Self Care | Payer: Medicare Other | Source: Ambulatory Visit | Attending: Radiation Oncology | Admitting: Radiation Oncology

## 2019-03-02 VITALS — BP 168/79 | HR 75 | Temp 96.5°F | Resp 20 | Wt 105.8 lb

## 2019-03-02 DIAGNOSIS — C8513 Unspecified B-cell lymphoma, intra-abdominal lymph nodes: Secondary | ICD-10-CM | POA: Insufficient documentation

## 2019-03-02 DIAGNOSIS — C8200 Follicular lymphoma grade I, unspecified site: Secondary | ICD-10-CM

## 2019-03-02 DIAGNOSIS — Z923 Personal history of irradiation: Secondary | ICD-10-CM | POA: Insufficient documentation

## 2019-03-02 DIAGNOSIS — R1013 Epigastric pain: Secondary | ICD-10-CM | POA: Insufficient documentation

## 2019-03-02 DIAGNOSIS — R634 Abnormal weight loss: Secondary | ICD-10-CM | POA: Diagnosis not present

## 2019-03-02 MED ORDER — SUCRALFATE 1 G PO TABS: 1.0000 g | ORAL_TABLET | Freq: Three times a day (TID) | ORAL | 6 refills | Status: DC

## 2019-03-02 NOTE — Progress Notes (Signed)
CT

## 2019-03-02 NOTE — Progress Notes (Signed)
Radiation Oncology Follow up Note  Name: [name]   Date:   03/02/2019 MRN:  [id number] DOB: [birth year]    This 85 y.o. 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  REFERRING PROVIDER: Marinda Elk, MD  HPI: patient is an 85 year old female now seen out 1 month having completed involved field radiation therapy to a large abdominal mass in patient with known follicular lymphoma grade 1. She seen today in routine follow-up she is doing fairly well she states she continues to lose weight. She's also having some dyspepsia which she had prior to treatment but is worsened. She is on proton pump inhibitors. She specifically denies fever chills and night sweats. She is not yet seen Dr. Mike Gip in follow-up..  COMPLICATIONS OF TREATMENT: none  FOLLOW UP COMPLIANCE: keeps appointments   PHYSICAL EXAM:  BP (!) 168/79 (BP Location: Left Arm, Patient Position: Sitting)   Pulse 75   Temp (!) 96.5 F (35.8 C) (Tympanic)   Resp 20   Wt 105 lb 13.1 oz (48 kg)   BMI 17.61 kg/m  Well-developed well-nourished patient in NAD. HEENT reveals PERLA, EOMI, discs not visualized.  Oral cavity is clear. No oral mucosal lesions are identified. Neck is clear without evidence of cervical or supraclavicular adenopathy. Lungs are clear to A&P. Cardiac examination is essentially unremarkable with regular rate and rhythm without murmur rub or thrill. Abdomen is benign with no organomegaly or masses noted. Motor sensory and DTR levels are equal and symmetric in the upper and lower extremities. Cranial nerves II through XII are grossly intact. Proprioception is intact. No peripheral adenopathy or edema is identified. No motor or sensory levels are noted. Crude visual fields are within normal range.  RADIOLOGY RESULTS: CT scan of abdomen ordered  PLAN: present time am starting on Carafate 1  g 3 times a day. Do not know the source of her dyspepsia as this mass was far from her gastric mucosa. I've ordered a CT scan and a follow-up appointment with Dr. Mike Gip in about a month's time. I have asked to see her back in 3-4 months for follow-up. Patient is to call sooner with any clinical problems.  I would like to take this opportunity to thank you for allowing me to participate in the care of your patient.Noreene Filbert, MD

## 2019-03-09 ENCOUNTER — Ambulatory Visit: Payer: Medicare Other | Admitting: Hematology and Oncology

## 2019-03-11 ENCOUNTER — Ambulatory Visit
Admission: RE | Admit: 2019-03-11 | Discharge: 2019-03-11 | Disposition: A | Discharge status: Home / Self Care | Payer: Medicare Other | Source: Ambulatory Visit | Attending: Radiation Oncology | Admitting: Radiation Oncology

## 2019-03-11 ENCOUNTER — Other Ambulatory Visit: Payer: Self-pay

## 2019-03-11 DIAGNOSIS — C8200 Follicular lymphoma grade I, unspecified site: Secondary | ICD-10-CM | POA: Diagnosis present

## 2019-03-11 LAB — POCT I-STAT CREATININE: Creatinine, Ser: 1 mg/dL (ref 0.44–1.00)

## 2019-03-11 MED ORDER — IOHEXOL 300 MG/ML  SOLN
60.0000 mL | Freq: Once | INTRAMUSCULAR | Status: AC | PRN
  Administered 2019-03-11: 60 mL via INTRAVENOUS

## 2019-03-18 ENCOUNTER — Telehealth: Payer: Self-pay

## 2019-03-18 NOTE — Telephone Encounter (Signed)
Spoke with the patient to see if she would like to be a tele visit for tuseday she has agreed and to move time to 11:30 AM. [name] was agreeable and understanding to move her time .

## 2019-03-21 ENCOUNTER — Other Ambulatory Visit: Payer: Self-pay

## 2019-03-22 ENCOUNTER — Inpatient Hospital Stay: Payer: Medicare Other | Attending: Hematology and Oncology | Admitting: Hematology and Oncology

## 2019-03-22 DIAGNOSIS — M810 Age-related osteoporosis without current pathological fracture: Secondary | ICD-10-CM

## 2019-03-22 DIAGNOSIS — C8200 Follicular lymphoma grade I, unspecified site: Secondary | ICD-10-CM

## 2019-03-22 DIAGNOSIS — Z7189 Other specified counseling: Secondary | ICD-10-CM

## 2019-03-22 NOTE — Progress Notes (Signed)
Confirmed name, DOB, and address. Denies any concerns at this time.  

## 2019-03-22 NOTE — Progress Notes (Signed)
[facility]  [address] [place], [place] [address] Phone: [phone]  Fax: [phone]   Telephone Office Visit:  03/22/2019   Referring physician:  Marinda Elk, MD   I connected with [name] on 03/22/19 at 1:04 PM EDT by telephone and verified that I was speaking with the correct person using 2 identifiers.  The patient was at home.  I discussed the limitations, risk, security and privacy concerns of performing an evaluation and management service by telephone and the availability of in person appointments.  I also discussed with the patient that there may be a patient responsible charge related to this service.  The patient expressed understanding and agreed to proceed.    Chief Complaint: [name] is a 85 y.o. female with a history of stage IIIA follicular non-Hodgkin's lymphoma who is evaluated for 3 month assessment.  HPI: The patient was last seen in the medical oncology clinic on 12/08/2019.  At that time,  she denied any B symptoms.  She felt back to normal after her biopsy. Exam revealed no adenpathy or hepatosplenomegaly.  CT guided biopsy confirmed persistent follicular lymphoma.  We discussed local radiation.  She was presented at tumor board on 12/23/2018.  Recommendation was radiation.  She saw Dr. Baruch Gouty on 12/27/2018.  Radiation therapy (IMRT) of 3000 cGy in 15 fractions was planned to the retroperitoneal mass.  She received radiation from 01/11/2019 - 01/31/2019.  Abdomen and pelvic CT on 03/11/2019 revealed partial treatment response. Right retroperitoneal nodal mass and aortocaval adenopathy had decreased in size (7.1 x 5.8 cm to 6.5 x 4.1 cm).  There was decreased mass effect on the IVC.  There were no new sites of disease.  There was mildly dilated upper abdominal small bowel loops compatible with mild adynamic ileus.  There was no evidence of bowel obstruction.  There was large colonic stool  volume suggests constipation.  During the interim, she notes that the radiation went well.  She states that after radiation completed, she "began to sink".  She describes symptoms of fatigue and acid reflux.  She states that she went into a tailspin as she was not eating much.  She lost 5 pounds.  The first 4 to 5 weeks after radiation were the worst.  She received a medication for reflux which is working now.  She has a little bit of discomfort at night.  She denies any melena or hematochezia.  She denies any abdominal pain.   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 She has never used smokeless tobacco.  She reports that she does not drink alcohol or use drugs.  She lives in [place].    Participants in the patient's visit and their role in the encounter included the patient, her husband , and [employer], Therapist, sports, today.  The intake visit was provided by Waymon Budge, RN.    Allergies:  Allergies  Allergen Reactions   Lisinopril Cough    Current Medications: Current Outpatient Medications  Medication Sig Dispense Refill   Acetaminophen (TYLENOL ARTHRITIS EXT RELIEF PO) Take 1 tablet by mouth daily as needed (pain).     allopurinol (ZYLOPRIM) 300 MG tablet TAKE ONE TABLET BY MOUTH  ONE TIME DAILY  90 tablet 0   AZELASTINE & FLUTICASONE NA 1 spray each nase twice daily.     calcium-vitamin D (CALCIUM 500/D) 500-200 MG-UNIT tablet Take 1 tablet by mouth 2 (two) times daily.      denosumab (PROLIA) 60 MG/ML SOSY injection Inject 60 mg into the skin every 6 (six) months.      Flax Oil-Fish Oil-Borage Oil (FISH OIL-FLAX OIL-BORAGE OIL) CAPS Take 1,000-1,400 mg by mouth daily.     Multiple Vitamin (MULTI-VITAMINS) TABS Take 1 tablet by mouth daily.      Multiple Vitamins-Minerals (PRESERVISION AREDS 2 PO) Take 1 tablet by mouth daily.      omeprazole (PRILOSEC) 40 MG capsule Take 40 mg by mouth daily.     Probiotic Product (PROBIOTIC DAILY PO) Take 2 tablets by mouth daily.      sucralfate (CARAFATE) 1 g tablet Take 1 tablet (1 g total) by mouth 3 (three) times daily. Dissolve in warm water, take 30 minutes meals 90 tablet 6   traZODone (DESYREL) 50 MG tablet TAKE 1 TO 2 TABLETS BY MOUTH DAILY AT BEDTIME AS NEEDED     triamcinolone cream (KENALOG) 0.1 % Apply 1 application topically 2 (two) times daily.      Turmeric Curcumin 500 MG CAPS Take 1 capsule by mouth daily.      No current facility-administered medications for this visit.     Review of Systems  Constitutional: Positive for weight loss (5 pounds post radiation). Negative for chills, diaphoresis and malaise/fatigue.       Feels "better".  HENT: Negative.  Negative for congestion, ear discharge, ear pain, nosebleeds, sinus pain and sore throat.   Eyes: Negative.  Negative for blurred vision, double vision, photophobia and pain.  Respiratory: Negative.  Negative for cough, hemoptysis, sputum production and shortness of breath.   Cardiovascular: Negative.  Negative for chest pain, palpitations, orthopnea, leg swelling and PND.  Gastrointestinal: Negative for abdominal pain, blood in stool, constipation, diarrhea, melena, nausea and vomiting.       Reflux, improving.  Abdominal discomfort at night.    Genitourinary: Negative.  Negative for dysuria, frequency, hematuria and urgency.  Musculoskeletal: Negative.  Negative for back pain, falls, joint pain, myalgias and neck pain.  Skin: Negative.  Negative for itching and rash.  Neurological: Positive for tremors (essential). Negative for dizziness, tingling, sensory change, speech change, focal weakness, weakness and headaches.  Endo/Heme/Allergies: Negative.  Does not bruise/bleed easily.  Psychiatric/Behavioral: Negative.  Negative for depression and memory loss. The patient is not nervous/anxious and does not have insomnia.   All other systems reviewed and are negative.  Performance status (ECOG): 1   Imaging studies: 09/02/2013:  Chest, abdomen, and pelvic CT revealed no significant interval change. She has some mildly enlarged intra-aortic caval retroperitoneal nodes measuring 1.6 x 1.2 cm. There were stable subcentimeter retroperitoneal  nodes. 09/07/2014:  Chest, abdomen, and pelvic CT revealed no evidence of lymphoma recurrence.   09/26/2015:  Chest, abdomen, and pelvic CT revealed stable portacaval (2.5 cm) and aortocaval (1.0 cm) lymph nodes and mild hepatomegaly.   10/29/2017:  Chest, abdomen, and pelvic CT revealed an enlarged mass in the porta hepatis at site of prior presumed lymph node. Differential would include isolated recurrent lymphadenopathy from non-Hodgkin's lymphoma versus gastrointestinal stromal tumor. Lesion was 5.8 x 4.9 cm (compared to 2.5 x 3.3 cm on 09/26/2015).  This lesion has been present but smaller at this site since 2015.  It was not present on PET-CT scan of 2009.  There was no additional metastatic disease evident in the chest, abdomen or pelvis. 04/01/2018:  Chest, abdomen, and pelvic CT  revealed roughly stable size and appearance of portacaval lymphadenopathy, which was compatible with residual disease.  Nodal mass measured 5.8 x 4.8 cm (previously 5.9 x 5.0 cm).  Note made of mild colonic diverticulosis  without evidence of acute diverticulitis, numerous small densely calcified areas in the uterus that likely represent tiny calcified fibroids, and severe calcifications of the mitral annulus. 10/29/2018:  Chest, abdomen, and pelvic CT  revealed slight interval increase in size of the large right retroperitoneal mass (7.1 x 5.8 cm; previously 5.7 x 5.1 cm) generating mass-effect on the IVC and right renal vein. There was possibly a small adjacent aortocaval lymph node that was stable.  There was no other lymphadenopathy in the chest, abdomen, or pelvis. 11/08/2018:  PET scan revealed a 5.5 x 7.3 cm hypermetabolic (SUV 43.1) right retroperitoneal nodal mass and aortocaval lymph node.  The liver appeared enlarged. 03/11/2019:  Abdomen and pelvic CT on 03/11/2019 revealed partial treatment response. Right retroperitoneal nodal mass and aortocaval adenopathy had decreased in size (7.1 x 5.8 cm to 6.5 x 4.1 cm).  There was decreased mass effect on the IVC.  There were no new sites of disease.    No visits with results within 3 Day(s) from this visit.  Latest known visit with results is:  Hospital Outpatient Visit on 03/11/2019  Component Date Value Ref Range Status   Creatinine, Ser 03/11/2019 1.00  0.44 - 1.00 mg/dL Final    Assessment:  HELVI ROYALS is a 85 y.o. female with a history of stage IIIA follicular non-Hodgkin's lymphoma.  She presented in the summer of 2003 with an epigastric and left upper quadrant mass and left arm weakness with decreased mobility. Abdominal ultrasound revealed a large mass in the head of the pancreas with  multiple enlarged lymph nodes and a large lobulated mass in the right retroperitoneal area.  MRI of the left brachial plexus revealed a 2.5 cm soft tissue mass and abnormal lymph node along the left brachial plexus and in the supraclavicular region.  Cervical node biopsy on 08/06/2002 revealed a grade I follicular non-Hodgkin's lymphoma.  Bone marrow on 08/09/2002  was negative.  Lumbar puncture was negative.  She received Leukeran and Rituxan. As her arm pain and mobility was not relieved, she received local radiation to the left brachial plexus area. Leukeran continued through 02/28/2013 (discontinued secondary side effects).  She developed progressive disease in 10/2003. Patient received CVP chemotherapy beginning 01/02/2004. In 06/2004 she was switched to CNOP.  She received 8 cycles completing on 01/15/2005.  She began maintenance Rituxan on 04/23/2004 (4 weekly doses every 3 months for 2 years). Therapy completed on 09/13/2008.    Excision of a 1 cm right preauricular mass on 10/04/2017 revealed a  low grade follicular lymphoma.  CT guided biopsy of the retroperitoneal nodal mass on 11/20/2017 revealed grade I follicular lymphoma.  Immunohistochemistry revealed positive for CD10, BCL-2, BCL-6, Ki67 (15-20%) and negative for cyclin D1 and CD5.  Flow cytometry revealed a CD10+ clonal B-cell population.   She received 4 weekly cycles of Rituxan (12/24/2017 - 01/14/2018).  She is s/p 3 cycles of maintenance Rituxan (03/24/2018 - 10/08/2018).  PET scan on 11/08/2018 revealed a 5.5 x 7.3 cm hypermetabolic (SUV 63.4) right retroperitoneal nodal mass and aortocaval lymph node.  CT guided biopsy on 94/94/4739 confirmed follicular lymphoma, grade I.  She received radiation (IMRT) of 3000 cGy to the retroperitoneal mass from 01/11/2019 - 01/31/2019.  Abdomen and pelvis CT on 03/11/2019 revealed partial treatment response. Right retroperitoneal nodal mass and aortocaval adenopathy had decreased in size (7.1 x 5.8 cm to 6.5 x 4.1 cm).  There was decreased mass effect on the IVC.  There were no new sites of disease.   The following studies were negative on 10/23/2017:  hepatitis B core antibody, hepatitis B surface antigen, hepatitis C antibody.  G6PD assay was normal.  She was admitted to [facility] from 08/15/2015 - 08/25/2015 with bloody diarrhea then hemolytic uremic  syndrome related to E. coli 0157.  She had a complicated course with acute renal failure and anemia.  She was then in rehab for 3 weeks.  She continues to work on her balance.  Bone density on 10/27/2017 revealed osteoporosis with a T-score of -3.8 in the left forearm radius.  She began every 6 month Prolia on 04/20/2018 (last 10/21/2018).  Symptomatically, she is improving s/p radiation.  Reflux is managed.  Plan:   1. Stage IIIA follicular non-Hodgkin's lymphoma  Discuss interval radiation treatment.  Review follow-up CT scans.  Images personally reviewed.  Agree with radiology interpretation.  Patient has had a partial treatment response.  Discuss plan for follow-up imaging in 3-6 months. 2.   Osteoporosis  Patient last received Prolia on 10/21/2018.  Anticipate Prolia at next visit.  Next bone density due 10/28/2019. 3.   RTC on 06/09/2019 for MD assessment and labs (CBC with diff, CMP, LDH, uric acid), and +/- Prolia.  I discussed the assessment and treatment plan with the patient.  The patient was provided an opportunity to ask questions and all were answered.  The patient agreed with the plan and demonstrated an understanding of the instructions.  The patient was advised to call back or seek an in person evaluation if the symptoms worsen or if the condition fails to improve as anticipated.  I provided 19 minutes (1:04 PM - 1:23 PM) of non-face-to-face time during this encounter.  I provided these services from the [facility] office.   Lequita Asal, MD  03/22/2019, 1:04 PM

## 2019-03-27 ENCOUNTER — Encounter: Payer: Self-pay | Admitting: Hematology and Oncology

## 2019-04-05 ENCOUNTER — Ambulatory Visit: Payer: Medicare Other

## 2019-04-18 ENCOUNTER — Telehealth: Payer: Self-pay

## 2019-04-18 NOTE — Telephone Encounter (Signed)
Telephone call to patient to discuss SCP visit over telephone.  Patient agrees to this method and informed patient I will mail her the SCP and Treatment Summary along with ASCO questions and she request I call her next Wednesday May 13th at 11:30 to review SCP.  Informed patient that APP will also talk to her as well about SCP.  Packet mailed.

## 2019-04-26 ENCOUNTER — Other Ambulatory Visit: Payer: Self-pay

## 2019-04-27 ENCOUNTER — Inpatient Hospital Stay: Payer: Medicare Other | Attending: Oncology | Admitting: Oncology

## 2019-04-27 DIAGNOSIS — C8290 Follicular lymphoma, unspecified, unspecified site: Secondary | ICD-10-CM

## 2019-04-27 NOTE — Progress Notes (Signed)
CLINIC:  Survivorship   REASON FOR VISIT:  Routine follow-up post-treatment for history of Stage IIIa follicular non-Hodgkin's lymphoma.   Virtual Visit via Telephone Note  I connected with [name] on 04/27/19 at 11:30 AM EDT by telephone and verified that I am speaking with the correct person using two identifiers.  Location: Patient: Home Provider: Office   I discussed the limitations, risks, security and privacy concerns of performing an evaluation and management service by telephone and the availability of in person appointments. I also discussed with the patient that there may be a patient responsible charge related to this service. The patient expressed understanding and agreed to proceed.   BRIEF ONCOLOGIC HISTORY:    Follicular lymphoma grade I ([place])   11/29/2017 Initial Diagnosis    Follicular lymphoma grade I ([place])    04/07/2018 - 01/05/2019 Chemotherapy    The patient had riTUXimab (RITUXAN) 600 mg in sodium chloride 0.9 % 250 mL (1.9355 mg/mL) infusion, 375 mg/m2 = 600 mg, Intravenous,  Once, 3 of 8 cycles  for chemotherapy treatment.      INTERVAL HISTORY:  Ms. Oka presents to the [facility] today for our initial meeting to review her survivorship care plan detailing her treatment course for Follicular lymphoma, as well as monitoring long-term side effects of that treatment, education regarding health maintenance, screening, and overall wellness and health promotion.     Overall, Ms. Boniface reports feeling quite well since completing her treatment which included radiation and chemotherapy.  Ms. Borenstein was diagnosed in the summer 2003 with an epigastric and left upper quadrant mass.  Had cervical node biopsy on 08/06/2002 revealing a grade 1 follicular non-Hodgkin's lymphoma.  Bone marrow was negative.  She received Tsosie Billing and Rituxan.  Received local radiation to left brachial plexus area.  Leukeran continued through 03/01/2003.  It was  discontinued secondary to side effects.  Developed progressive disease in 10/2003 where she received CVP in 2005 and switched to CNOP where she received 8 cycles into 2005.  Began maintenance Rituxan in late May 2005.  Therapy completed in September 2009.  Recurrence noted in October 2018.  She received 4 weekly cycles of Rituxan from 12/24/2017 through 01/14/2018.  She is status post 3 cycles of maintenance Rituxan from 03/24/2018 through 10/08/2018. She was evaluated by Dr. Donella Stade on 12/27/2018 and received 15 fractions of 3000 cgy to retroperitoneal mass.  Radiation was from 01/11/19-01/31/19.  Imaging of abdomen pelvis on 03/11/2019 revealed partial treatment response.   REVIEW OF SYSTEMS:  Review of Systems  Constitutional: Positive for fatigue. Negative for appetite change, fever and unexpected weight change.  HENT:   Negative for nosebleeds, sore throat and trouble swallowing.   Eyes: Negative.   Respiratory: Negative.  Negative for cough, shortness of breath and wheezing.   Cardiovascular: Negative.  Negative for chest pain and leg swelling.  Gastrointestinal: Positive for constipation. Negative for abdominal pain, blood in stool, diarrhea, nausea and vomiting.  Endocrine: Negative.   Genitourinary: Negative.  Negative for bladder incontinence, hematuria and nocturia.        Acid reflux  Musculoskeletal: Negative.  Negative for back pain and flank pain.  Skin: Negative.   Neurological: Positive for numbness. Negative for dizziness, headaches and light-headedness.  Hematological: Negative.   Psychiatric/Behavioral: Negative.  Negative for confusion. The patient is not nervous/anxious.     ONCOLOGY TREATMENT TEAM:  1. Medical Oncologist: Dr. Mike Gip 2. Radiation Oncologist: Dr. Baruch Gouty    PAST MEDICAL/SURGICAL HISTORY:  Past Medical  History:  Diagnosis Date  . Cancer [facility]) 2013   Non-Hodgkin Lymphoma with chemo and rad tx  . Follicular lymphoma grade I ([place]) 08/06/2002  . 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 Hypertension   . Meniere's disease    with bilateral hearing loss  . Mini stroke [facility])    summer 2014  . Osteoporosis    Past Surgical History:  Procedure Laterality Date  . BONE MARROW BIOPSY  08/09/2002  . BREAST LUMPECTOMY  1950s   benign  . CATARACT EXTRACTION  03/2009  . LYMPH NODE BIOPSY  08/07/2002   cervical  . TUBAL LIGATION       ALLERGIES:  Allergies  Allergen Reactions  . Lisinopril Cough     CURRENT MEDICATIONS:  Outpatient Encounter Medications as of 04/27/2019  Medication Sig Note  . Acetaminophen (TYLENOL ARTHRITIS EXT RELIEF PO) Take 1 tablet by mouth daily as needed (pain).   [name] allopurinol (ZYLOPRIM) 300 MG tablet TAKE ONE TABLET BY MOUTH ONE TIME DAILY    . AZELASTINE & FLUTICASONE NA 1 spray each nase twice daily. 10/03/2016: Received from: [place]  . calcium-vitamin D (CALCIUM 500/D) 500-200 MG-UNIT tablet Take 1 tablet by mouth 2 (two) times daily.  10/02/2015: Received from: [place]  . denosumab (PROLIA) 60 MG/ML SOSY injection Inject 60 mg into the skin every 6 (six) months.    . Flax Oil-Fish Oil-Borage Oil (FISH OIL-FLAX OIL-BORAGE OIL) CAPS Take 1,000-1,400 mg by mouth daily.   . Multiple Vitamin (MULTI-VITAMINS) TABS Take 1 tablet by mouth daily.  10/02/2015: Received from: [place]  . Multiple Vitamins-Minerals (PRESERVISION AREDS 2 PO) Take 1 tablet by mouth daily.  10/03/2016: Received from: [place]: Take by mouth.  [name] omeprazole (PRILOSEC) 40 MG capsule Take 40 mg by mouth daily.   . Probiotic Product (PROBIOTIC DAILY PO) Take 2 tablets by mouth daily.  10/03/2016: Received from: [place]: Take 2 tablets by mouth daily.   . sucralfate (CARAFATE) 1 g tablet Take 1 tablet (1 g total) by mouth 3 (three) times daily. Dissolve in warm water, take 30 minutes meals   .  traZODone (DESYREL) 50 MG tablet TAKE 1 TO 2 TABLETS BY MOUTH DAILY AT BEDTIME AS NEEDED 10/02/2015: Received from: [facility]  . triamcinolone cream (KENALOG) 0.1 % Apply 1 application topically 2 (two) times daily.  10/02/2015: Received from: [place]  . Turmeric Curcumin 500 MG CAPS Take 1 capsule by mouth daily.  10/02/2015: Received from: [place]   No facility-administered encounter medications on file as of 04/27/2019.      ONCOLOGIC FAMILY HISTORY:  Family History  Problem Relation Age of Onset  . Cancer Father        Throat  . Cancer Sister 29       Colon    GENETIC COUNSELING/TESTING:   SOCIAL HISTORY:  NYSIA DELL is married and lives with her spouse at the [facility] [place] in [place], [place].  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  She denies any current or history of tobacco, alcohol, or illicit drug use.     PHYSICAL EXAMINATION:  Vital Signs:  There were no vitals filed for this visit. There were no vitals filed for this visit.  Virtual telephone visit  LABORATORY DATA:  Lab Results  Component Value Date  WBC 7.1 01/25/2019   HGB 10.5 (L) 01/25/2019   HCT 31.6 (L) 01/25/2019   MCV 97.5 01/25/2019   PLT 213 01/25/2019     Chemistry      Component Value Date/Time   NA 137 10/21/2018 1052   NA 125 (L) 11/26/2013 0954   K 4.1 10/21/2018 1052   K 3.2 (L) 11/26/2013 0954   CL 103 10/21/2018 1052   CL 91 (L) 11/26/2013 0954   CO2 26 10/21/2018 1052   CO2 29 11/26/2013 0954   BUN 20 10/21/2018 1052   BUN 10 11/26/2013 0954   CREATININE 1.00 03/11/2019 1353   CREATININE 0.70 11/26/2013 0954      Component Value Date/Time   CALCIUM 9.4 10/21/2018 1052   CALCIUM 8.5 11/26/2013 0954   ALKPHOS 70 10/08/2018 0858   ALKPHOS 61 11/26/2013 0954   AST 25 10/08/2018 0858   AST 35 11/26/2013 0954   ALT 25 10/08/2018 0858   ALT 29 11/26/2013 0954   BILITOT 1.0  10/08/2018 0858   BILITOT 0.4 11/26/2013 0954      DIAGNOSTIC IMAGING:  CT abdomen/pelvis scan:03/11/2019 1. Partial treatment response. Right retroperitoneal nodal mass and aortocaval adenopathy have decreased. No new sites of disease. 2. Mildly dilated upper abdominal small bowel loops compatible with mild adynamic ileus. No evidence of bowel obstruction. 3. Large colonic stool volume suggests constipation. 4.  Aortic Atherosclerosis (ICD10-I70.0).   ASSESSMENT AND PLAN:  Ms.. Ketcher is a pleasant 85 y.o. female with Stage III follicular lymphoma in the right retroperitoneum status post radiation and chemotherapy. She presents to the Survivorship Clinic for our initial meeting and routine follow-up post-completion of treatment for her cancer.    1. Stage IIIa follicular lymphoma:  Ms. Quiett is continuing to recover from radiation treatment to right retroperitoneal mass. She will follow-up with her medical oncologist, Dr. Mike Gip on June 25 for labs, assessment and Prolia injection.  Today, a comprehensive survivorship care plan and treatment summary was reviewed with the patient today detailing her cancer diagnosis, treatment course, potential late/long-term effects of treatment, appropriate follow-up care with recommendations for the future, and patient education resources.  A copy of this summary, along with a letter will be sent to the patient's primary care provider via mail/fax/In Basket message after today's visit.    Mrs. Velasques is currently under surveillance and not receiving any active treatment at this time.  Plan is for follow-up imaging in 3 to 6 months.  This has not yet been scheduled.  Likely will be scheduled at next month's visit.  #. Problem(s) at Visit: Mrs. Petillo had several complaints during our visit today.  Most concerning problem is persistent GERD.  States she has burning from breastbone up into her mouth.  GERD began after completing radiation in  February 2020.  Current medication regimen includes Protonix 40 mg daily, famotidine at bedtime as needed and Carafate as needed.  Has noticed mild improvement since adding and famotidine at bedtime as needed.  We spoke at length about lifestyle and dietary modifications that may help her symptoms including weight loss, elevation of head of the bed, elimination of dietary triggers including caffeine, chocolate, spicy foods with high fat content, carbonated beverages and peppermint.  We did speak about possibly needing a GI consult if symptoms were persistent or non-manageable.  Patient may need an EGD for other potential causes of her GERD.  She is not interested in seeing a new physician at this time.   Neuropathy: Mrs. Arrey previously  complained neuropathy in bilateral upper and lower extremities.  She has noted improvement since receiving chemotherapy.  She is currently not on any medications.  She continues daily exercise of affected limbs which appears to be helping.  #. Bone health:  Given Ms. Dartt's age and history place her at risk for bone demineralization.  Her last DEXA scan was 10/2017, which showed T score of -3.8.  She is currently receiving every 59-monthProlia for osteoporosis.  In the meantime, she was encouraged to increase her consumption of foods rich in calcium, as well as increase her weight-bearing activities.  She was given education on specific activities to promote bone health. Continue to encourage supplementation of Calcium 12093mand Vitamin D 800 iu.   #. Cancer screening:  Due to Ms. Kolodziejski's history and her age, she should receive screening for skin cancers, colon cancer, and gynecologic cancers.  The information and recommendations are listed on the patient's comprehensive care plan/treatment summary and were reviewed in detail with the patient.    #. Health maintenance and wellness promotion: Ms. McBarichas encouraged to consume 5-7 servings of fruits and  vegetables per day. We reviewed the "Nutrition Rainbow" handout, as well as the handout "Take Control of Your Health and Reduce Your Cancer Risk" from the [place] She was also encouraged to engage in moderate to vigorous exercise for 30 minutes per day most days of the week. We discussed the [employer] fitness program, which is designed for cancer survivors to help them become more physically fit after cancer treatments.  She was instructed to limit her alcohol consumption and continue to abstain from tobacco use.     #. Support services/counseling: It is not uncommon for this period of the patient's cancer care trajectory to be one of many emotions and stressors.  We discussed an opportunity for her to participate in the next session of [facility]Finding Your New Normal") support group series designed for patients after they have completed treatment.   Ms. McMccownas encouraged to take advantage of our many other support services programs, support groups, and/or counseling in coping with her new life as a cancer survivor after completing anti-cancer treatment.  She was offered support today through active listening and expressive supportive counseling.  She was given information regarding our available services and encouraged to contact me with any questions or for help enrolling in any of our support group/programs.    Dispo:   -Return to cancer center as scheduled in June for labs, MD and Prolia injection.  - DEXA scan due in November 2020.  - Consider GI referral for endoscopy if GERD symptoms not improved.  -She is welcome to return back to the Survivorship Clinic at any time; no additional follow-up needed at this time.  -Consider referral back to survivorship as a long-term survivor for continued surveillance  I provided 30 minutes of non face-to-face telephone visit time during this encounter, and > 50% was spent counseling as documented under my assessment & plan.   JeFaythe Casa AGNP-C Survivorship Program [facility] [place] [place] Note: PRIMARY CARE PROVIDER [id number] [phone]

## 2019-04-27 NOTE — Progress Notes (Signed)
Survivorship Care Plan visit completed.  Treatment summary reviewed and given to patient.  ASCO answers booklet reviewed and given to patient.  CARE program and Cancer Transitions discussed with patient along with other resources cancer center offers to patients and caregivers.  Patient verbalized understanding.    

## 2019-06-08 ENCOUNTER — Other Ambulatory Visit: Payer: Self-pay

## 2019-06-08 NOTE — Progress Notes (Signed)
[facility]  [address], [address] [place], [place] [address] Phone: [phone]  Fax: [phone]   Clinic Day:  06/09/2019  Referring physician: Marinda Elk, MD  Chief Complaint: [name] is a 85 y.o. female with a history of stage IIIA follicular non-Hodgkin's lymphoma who is seen for 2 month assessment.  HPI: The patient was last seen in the medical oncology clinic via telemdicine on 03/22/2019. At that time, she was improving s/p radiation.  Imaging revealed a partial treatment response.  We discussed ongoing surveillance.  She was seen in cardiology by Dr. Nigel Berthold on 04/07/2019 for syncope. She was encouraged to hydrate and increase her sodium intake. Six month follow-up was scheduled.   She was seen by Faythe Casa, NP on 04/27/2019 for Survivorship Clinic via telemedicine. Problems noted at visit included reflux, neuropathy, and osteoporosis.  During the interim, she has been well. Her weight is stable. She has fequent discomfort in her abdomen. She has been eating well. She has started taking Miralax nightly, which has aided in improving her reflux. She has also stopped eating after 4pm.   She continues on calcium, 59m twice daily, and vitamin supplement. She incorporates calcium in her diet. She denies any dental issues.    Past Medical History:  Diagnosis Date  . Cancer ([facility] 2013   Non-Hodgkin Lymphoma with chemo and rad tx  . Follicular lymphoma grade I ([facility] 08/06/2002  . 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 Hypertension   . Meniere's disease    with bilateral hearing loss  . Mini stroke ([facility]    summer 2014  . Osteoporosis     Past Surgical History:  Procedure Laterality Date  . BONE MARROW BIOPSY  08/09/2002  . BREAST LUMPECTOMY  1950s   benign  . CATARACT EXTRACTION  03/2009  . LYMPH NODE BIOPSY  08/07/2002   cervical  . TUBAL LIGATION      Family History  Problem  Relation Age of Onset  . Cancer Father        Throat  . Cancer Sister [phone]      Colon    Social History:  reports that she has never smoked. She has never used smokeless tobacco. She reports that she does not drink alcohol or use drugs.  [place] She and her husband are getting restless staying at home during the pandemic. [place] The patient is alone today.  Allergies:  Allergies  Allergen Reactions  . Lisinopril Cough    Current Medications: Current Outpatient Medications  Medication Sig Dispense Refill  . Acetaminophen (TYLENOL ARTHRITIS EXT RELIEF PO) Take 1 tablet by mouth daily as needed (pain).    .Marland Kitchenallopurinol (ZYLOPRIM) 300 MG tablet TAKE ONE TABLET BY MOUTH ONE TIME DAILY  90 tablet 0  . AZELASTINE & FLUTICASONE NA 1 spray each nase twice daily.    . calcium-vitamin D (CALCIUM 500/D) 500-200 MG-UNIT tablet Take 1 tablet by mouth 2 (two) times daily.     .Marland Kitchendenosumab (PROLIA) 60 MG/ML SOSY injection Inject 60 mg into the skin every 6 (six) months.     . Flax Oil-Fish Oil-Borage Oil (FISH OIL-FLAX OIL-BORAGE OIL) CAPS Take 1,000-1,400 mg by mouth daily.    . Multiple Vitamin (MULTI-VITAMINS) TABS Take 1 tablet by mouth daily.     . Multiple Vitamins-Minerals (PRESERVISION AREDS 2 PO) Take 1 tablet by mouth daily.     .[name]  omeprazole (PRILOSEC) 40 MG capsule Take 40 mg by mouth daily.    . polyethylene glycol (MIRALAX / GLYCOLAX) 17 g packet Take 17 g by mouth daily.    . Probiotic Product (PROBIOTIC DAILY PO) Take 2 tablets by mouth daily.     . sucralfate (CARAFATE) 1 g tablet Take 1 tablet (1 g total) by mouth 3 (three) times daily. Dissolve in warm water, take 30 minutes meals 90 tablet 6  . traZODone (DESYREL) 50 MG tablet TAKE 1 TO 2 TABLETS BY MOUTH DAILY AT BEDTIME AS NEEDED    . triamcinolone cream (KENALOG) 0.1 % Apply 1 application topically 2 (two) times daily.     . Turmeric Curcumin 500 MG CAPS Take 1 capsule by mouth daily.       No current facility-administered medications for this visit.     Review of Systems  Constitutional: Negative for chills, diaphoresis, fever, malaise/fatigue and weight loss (stable).       Feels "fine".  HENT: Negative.  Negative for congestion, ear discharge, ear pain, nosebleeds, sinus pain and sore throat.   Eyes: Negative.  Negative for blurred vision, double vision, photophobia and pain.  Respiratory: Negative.  Negative for cough, hemoptysis, sputum production and shortness of breath.   Cardiovascular: Negative.  Negative for chest pain, palpitations, orthopnea, leg swelling and PND.  Gastrointestinal: Positive for abdominal pain (frequent discomfort, often at night) and heartburn (improving). Negative for blood in stool, constipation, diarrhea, melena, nausea and vomiting.  Genitourinary: Negative.  Negative for dysuria, frequency, hematuria and urgency.  Musculoskeletal: Negative.  Negative for back pain, falls, joint pain, myalgias and neck pain.  Skin: Negative.  Negative for itching and rash.  Neurological: Positive for tremors (essential). Negative for dizziness, tingling, sensory change, speech change, focal weakness, weakness and headaches.  Endo/Heme/Allergies: Negative.  Does not bruise/bleed easily.  Psychiatric/Behavioral: Negative.  Negative for depression and memory loss. The patient is not nervous/anxious and does not have insomnia.   All other systems reviewed and are negative.  Performance status (ECOG): 1  Vitals: Blood pressure (!) 145/71, pulse 64, temperature 97.6 F (36.4 C), temperature source Oral, resp. rate 18, weight 112 lb 12.2 oz (51.1 kg), SpO2 100 %.   Physical Exam  Constitutional: She is oriented to person, place, and time. She appears well-developed and well-nourished. No distress.  HENT:  Head: Normocephalic and atraumatic.  Mouth/Throat: Oropharynx is clear and moist. No oropharyngeal exudate.  Short gray hair. Mask.  Eyes: Pupils are  equal, round, and reactive to light. Conjunctivae and EOM are normal. No scleral icterus.  Glasses.  Blue eyes.   Neck: Normal range of motion. Neck supple. No JVD present.  Cardiovascular: Normal rate, regular rhythm and normal heart sounds.  No murmur heard. Pulmonary/Chest: Effort normal and breath sounds normal. No respiratory distress. She has no wheezes.  Abdominal: Soft. Bowel sounds are normal. She exhibits no distension and no mass. There is no abdominal tenderness. There is no rebound and no guarding.  Musculoskeletal: Normal range of motion.        General: No edema.  Lymphadenopathy:    She has no cervical adenopathy.    She has no axillary adenopathy.       Right: No inguinal and no supraclavicular adenopathy present.       Left: No inguinal and no supraclavicular adenopathy present.  Neurological: She is alert and oriented to person, place, and time.  Skin: Skin is warm and dry. No rash noted. She is not diaphoretic.  No erythema.  Psychiatric: She has a normal mood and affect. Her behavior is normal. Judgment and thought content normal.  Nursing note and vitals reviewed.   Imaging studies: 09/02/2013:  Chest, abdomen, and pelvic CT revealed no significant interval change. She has some mildly enlarged intra-aortic caval retroperitoneal nodes measuring 1.6 x 1.2 cm. There were stable subcentimeter retroperitoneal nodes. 09/07/2014:  Chest, abdomen, and pelvic CT revealed no evidence of lymphoma recurrence.   09/26/2015:  Chest, abdomen, and pelvic CT revealed stable portacaval (2.5 cm) and aortocaval (1.0 cm) lymph nodes and mild hepatomegaly.   10/29/2017:  Chest, abdomen, and pelvic CT revealed an enlarged mass in the porta hepatis at site of prior presumed lymph node. Differential would include isolated recurrent lymphadenopathy from non-Hodgkin's lymphoma versus gastrointestinal stromal tumor. Lesion was 5.8 x 4.9 cm (compared to 2.5 x 3.3 cm on 09/26/2015).  This lesion has  been present but smaller at this site since 2015.  It was not present on PET-CT scan of 2009.  There was no additional metastatic disease evident in the chest, abdomen or pelvis. 04/01/2018:  Chest, abdomen, and pelvic CT  revealed roughly stable size and appearance of portacaval lymphadenopathy, which was compatible with residual disease.  Nodal mass measured 5.8 x 4.8 cm (previously 5.9 x 5.0 cm).  Note made of mild colonic diverticulosis without evidence of acute diverticulitis, numerous small densely calcified areas in the uterus that likely represent tiny calcified fibroids, and severe calcifications of the mitral annulus. 10/29/2018:  Chest, abdomen, and pelvic CT  revealed slight interval increase in size of the large right retroperitoneal mass (7.1 x 5.8 cm; previously 5.7 x 5.1 cm) generating mass-effect on the IVC and right renal vein. There was possibly a small adjacent aortocaval lymph node that was stable.  There was no other lymphadenopathy in the chest, abdomen, or pelvis. 11/08/2018:  PET scan revealed a 5.5 x 7.3 cm hypermetabolic (SUV 91.6) right retroperitoneal nodal mass and aortocaval lymph node.  The liver appeared enlarged. 03/11/2019:  Abdomen and pelvic CT on 03/11/2019 revealed partial treatment response. Right retroperitoneal nodal mass and aortocaval adenopathy had decreased in size (7.1 x 5.8 cm to 6.5 x 4.1 cm).  There was decreased mass effect on the IVC.  There were no new sites of disease.    Appointment on 06/09/2019  Component Date Value Ref Range Status  . Uric Acid, Serum 06/09/2019 2.9  2.5 - 7.1 mg/dL Final   Performed at [facility], [address] [place], [place] [address]  . LDH 06/09/2019 128  98 - 192 U/L Final   Performed at [facility], [address] [place], [place] [address]  . Sodium 06/09/2019 134* 135 - 145 mmol/L Final  . Potassium 06/09/2019 3.8  3.5 - 5.1 mmol/L Final  . Chloride 06/09/2019 101  98 - 111 mmol/L Final   . CO2 06/09/2019 25  22 - 32 mmol/L Final  . Glucose, Bld 06/09/2019 131* 70 - 99 mg/dL Final  . BUN 06/09/2019 24* 8 - 23 mg/dL Final  . Creatinine, Ser 06/09/2019 0.79  0.44 - 1.00 mg/dL Final  . Calcium 06/09/2019 8.8* 8.9 - 10.3 mg/dL Final  . Total Protein 06/09/2019 6.0* 6.5 - 8.1 g/dL Final  . Albumin 06/09/2019 4.0  3.5 - 5.0 g/dL Final  . AST 06/09/2019 22  15 - 41 U/L Final  . ALT 06/09/2019 22  0 - 44 U/L Final  . Alkaline Phosphatase 06/09/2019 72  38 -  126 U/L Final  . Total Bilirubin 06/09/2019 0.3  0.3 - 1.2 mg/dL Final  . GFR calc non Af Amer 06/09/2019 >60  >60 mL/min Final  . GFR calc Af Amer 06/09/2019 >60  >60 mL/min Final  . Anion gap 06/09/2019 8  5 - 15 Final   Performed at [facility], [address], [place], [place] [address]  . WBC 06/09/2019 8.1  4.0 - 10.5 K/uL Final  . RBC 06/09/2019 3.27* 3.87 - 5.11 MIL/uL Final  . Hemoglobin 06/09/2019 11.2* 12.0 - 15.0 g/dL Final  . HCT 06/09/2019 32.5* 36.0 - 46.0 % Final  . MCV 06/09/2019 99.4  80.0 - 100.0 fL Final  . MCH 06/09/2019 34.3* 26.0 - 34.0 pg Final  . MCHC 06/09/2019 34.5  30.0 - 36.0 g/dL Final  . RDW 06/09/2019 13.4  11.5 - 15.5 % Final  . Platelets 06/09/2019 269  150 - 400 K/uL Final  . nRBC 06/09/2019 0.0  0.0 - 0.2 % Final  . Neutrophils Relative % 06/09/2019 83  % Final  . Neutro Abs 06/09/2019 6.8  1.7 - 7.7 K/uL Final  . Lymphocytes Relative 06/09/2019 4  % Final  . Lymphs Abs 06/09/2019 0.3* 0.7 - 4.0 K/uL Final  . Monocytes Relative 06/09/2019 11  % Final  . Monocytes Absolute 06/09/2019 0.9  0.1 - 1.0 K/uL Final  . Eosinophils Relative 06/09/2019 1  % Final  . Eosinophils Absolute 06/09/2019 0.1  0.0 - 0.5 K/uL Final  . Basophils Relative 06/09/2019 0  % Final  . Basophils Absolute 06/09/2019 0.0  0.0 - 0.1 K/uL Final  . Immature Granulocytes 06/09/2019 1  % Final  . Abs Immature Granulocytes 06/09/2019 0.04  0.00 - 0.07 K/uL Final   Performed at [facility], [address], [place], [place] [address]    Assessment:  [name] is a 85 y.o. female with a history of stage IIIA follicular non-Hodgkin's lymphoma.  She presented in the summer of 2003 with an epigastric and left upper quadrant mass and left arm weakness with decreased mobility. Abdominal ultrasound revealed a large mass in the head of the pancreas with  multiple enlarged lymph nodes and a large lobulated mass in the right retroperitoneal area.  MRI of the left brachial plexus revealed a 2.5 cm soft tissue mass and abnormal lymph node along the left brachial plexus and in the supraclavicular region.  Cervical node biopsy on 08/06/2002 revealed a grade I follicular non-Hodgkin's lymphoma.  Bone marrow on 08/09/2002 was negative.  Lumbar puncture was negative.  She received Leukeran and Rituxan. As her arm pain and mobility was not relieved, she received local radiation to the left brachial plexus area. Leukeran continued through 02/28/2013 (discontinued secondary side effects).  She developed progressive disease in 10/2003. Patient received CVP chemotherapy beginning 01/02/2004. In 06/2004 she was switched to CNOP.  She received 8 cycles completing on 01/15/2005.  She began maintenance Rituxan on 04/23/2004 (4 weekly doses every 3 months for 2 years). Therapy completed on 09/13/2008.    Excision of a 1 cm right preauricular mass on 10/04/2017 revealed a low grade follicular lymphoma.  CT guided biopsy of the retroperitoneal nodal mass on 11/20/2017 revealed grade I follicular lymphoma.  Immunohistochemistry revealed positive for CD10, BCL-2, BCL-6, Ki67 (15-20%) and negative for cyclin D1 and CD5.  Flow cytometry revealed a CD10+ clonal B-cell population.   She received 4 weekly cycles of Rituxan (12/24/2017 - 01/14/2018).  She is s/p 3  cycles of maintenance Rituxan (03/24/2018 - 10/08/2018).  PET scan on 11/08/2018 revealed a 5.5 x 7.3 cm hypermetabolic (SUV 93.8) right retroperitoneal  nodal mass and aortocaval lymph node. CT guided biopsy on 09/30/5101 confirmed follicular lymphoma, grade I.  She received radiation (IMRT) of 3000 cGy to the retroperitoneal mass from 01/11/2019 - 01/31/2019.  Abdomen and pelvis CT on 03/11/2019 revealed partial treatment response. Right retroperitoneal nodal mass and aortocaval adenopathy had decreased in size (7.1 x 5.8 cm to 6.5 x 4.1 cm).  There was decreased mass effect on the IVC.  There were no new sites of disease.   The following studies were negative on 10/23/2017:  hepatitis B core antibody, hepatitis B surface antigen, hepatitis C antibody.  G6PD assay was normal.  She was admitted to [facility] from 08/15/2015 - 08/25/2015 with bloody diarrhea then hemolytic uremic syndrome related to E. coli 0157.  She had a complicated course with acute renal failure and anemia.  She was then in rehab for 3 weeks.  She continues to work on her balance.  Bone density on 10/27/2017 revealed osteoporosis with a T-score of -3.8 in the left forearm radius.  She began every 6 month Prolia on 04/20/2018 (last 10/21/2018).  Symptomatically, she denies any new concerns.  Weight is stable.  Exam reveals no adenopathy or hepatosplenomegaly.  Plan: 1.   Labs today:  CBC with diff, CMP, LDH, uric acid. 2.   Stage IIIA follicular non-Hodgkin's lymphoma       Patient is status post IMRT to the retroperitoneal mass.       Symptomatically, she is feeling a little better.    She continues to have some reflux, but less.  She can eat after 4 pm.  Anticipate slow improvement in known retroperitoneal mass s/p radiation.   Suspect follow-up imaging will reveal some residual scar/remnant.       Discuss plan for follow-up imaging in 3 months. 3.   Osteoporosis             Calcium 8.8.  Creatinine 0.79.    Continue calcium and vitamin D.             Prolia today.             Bone density on 10/31/2019. 4.     RTC after CT scan for MD assessment, labs (CBC with  diff, CMP, LDH, uric acid) and review of CT scan.   I discussed the assessment and treatment plan with the patient.  The patient was provided an opportunity to ask questions and all were answered.  The patient agreed with the plan and demonstrated an understanding of the instructions.  The patient was advised to call back if the symptoms worsen or if the condition fails to improve as anticipated.  I provided 20 minutes of face-to-face time during this this encounter and > 50% was spent counseling as documented under my assessment and plan.    Lequita Asal, MD, PhD    06/09/2019, 1:41 PM  I, Molly Dorshimer, am acting as Education administrator for [employer]. Mike Gip, MD, PhD.  I, Melissa C. Mike Gip, MD, have reviewed the above documentation for accuracy and completeness, and I agree with the above.

## 2019-06-09 ENCOUNTER — Encounter: Payer: Self-pay | Admitting: Hematology and Oncology

## 2019-06-09 ENCOUNTER — Inpatient Hospital Stay: Payer: Medicare Other | Attending: Hematology and Oncology | Admitting: Hematology and Oncology

## 2019-06-09 ENCOUNTER — Inpatient Hospital Stay: Payer: Medicare Other

## 2019-06-09 VITALS — BP 145/71 | HR 64 | Temp 97.6°F | Resp 18 | Wt 112.8 lb

## 2019-06-09 DIAGNOSIS — M810 Age-related osteoporosis without current pathological fracture: Secondary | ICD-10-CM | POA: Diagnosis not present

## 2019-06-09 DIAGNOSIS — Z923 Personal history of irradiation: Secondary | ICD-10-CM | POA: Insufficient documentation

## 2019-06-09 DIAGNOSIS — C8200 Follicular lymphoma grade I, unspecified site: Secondary | ICD-10-CM | POA: Insufficient documentation

## 2019-06-09 DIAGNOSIS — Z9221 Personal history of antineoplastic chemotherapy: Secondary | ICD-10-CM | POA: Diagnosis not present

## 2019-06-09 LAB — COMPREHENSIVE METABOLIC PANEL
ALT: 22 U/L (ref 0–44)
AST: 22 U/L (ref 15–41)
Albumin: 4 g/dL (ref 3.5–5.0)
Alkaline Phosphatase: 72 U/L (ref 38–126)
Anion gap: 8 (ref 5–15)
BUN: 24 mg/dL — ABNORMAL HIGH (ref 8–23)
CO2: 25 mmol/L (ref 22–32)
Calcium: 8.8 mg/dL — ABNORMAL LOW (ref 8.9–10.3)
Chloride: 101 mmol/L (ref 98–111)
Creatinine, Ser: 0.79 mg/dL (ref 0.44–1.00)
GFR calc Af Amer: >60 mL/min (ref >60)
GFR calc non Af Amer: >60 mL/min (ref >60)
Glucose, Bld: 131 mg/dL — ABNORMAL HIGH (ref 70–99)
Potassium: 3.8 mmol/L (ref 3.5–5.1)
Sodium: 134 mmol/L — ABNORMAL LOW (ref 135–145)
Total Bilirubin: 0.3 mg/dL (ref 0.3–1.2)
Total Protein: 6 g/dL — ABNORMAL LOW (ref 6.5–8.1)

## 2019-06-09 LAB — CBC WITH DIFFERENTIAL/PLATELET
Abs Immature Granulocytes: 0.04 10*3/uL (ref 0.00–0.07)
Basophils Absolute: 0 10*3/uL (ref 0.0–0.1)
Basophils Relative: 0 %
Eosinophils Absolute: 0.1 10*3/uL (ref 0.0–0.5)
Eosinophils Relative: 1 %
HCT: 32.5 % — ABNORMAL LOW (ref 36.0–46.0)
Hemoglobin: 11.2 g/dL — ABNORMAL LOW (ref 12.0–15.0)
Immature Granulocytes: 1 %
Lymphocytes Relative: 4 %
Lymphs Abs: 0.3 10*3/uL — ABNORMAL LOW (ref 0.7–4.0)
MCH: 34.3 pg — ABNORMAL HIGH (ref 26.0–34.0)
MCHC: 34.5 g/dL (ref 30.0–36.0)
MCV: 99.4 fL (ref 80.0–100.0)
Monocytes Absolute: 0.9 10*3/uL (ref 0.1–1.0)
Monocytes Relative: 11 %
Neutro Abs: 6.8 10*3/uL (ref 1.7–7.7)
Neutrophils Relative %: 83 %
Platelets: 269 10*3/uL (ref 150–400)
RBC: 3.27 MIL/uL — ABNORMAL LOW (ref 3.87–5.11)
RDW: 13.4 % (ref 11.5–15.5)
WBC: 8.1 10*3/uL (ref 4.0–10.5)
nRBC: 0 % (ref 0.0–0.2)

## 2019-06-09 LAB — URIC ACID: Uric Acid, Serum: 2.9 mg/dL (ref 2.5–7.1)

## 2019-06-09 LAB — LACTATE DEHYDROGENASE: LDH: 128 U/L (ref 98–192)

## 2019-06-09 MED ORDER — DENOSUMAB 60 MG/ML ~~LOC~~ SOSY
60.0000 mg | PREFILLED_SYRINGE | Freq: Once | SUBCUTANEOUS | Status: AC
  Administered 2019-06-09: 14:00:00 60 mg via SUBCUTANEOUS
  Filled 2019-06-09: qty 1

## 2019-06-09 NOTE — Progress Notes (Signed)
Pt here for follow up. Denies any concerns.  

## 2019-06-09 NOTE — Progress Notes (Signed)
Calcium 8.8 . Per MD okay to receive Prolia today

## 2019-06-30 ENCOUNTER — Ambulatory Visit: Payer: Medicare Other | Admitting: Radiation Oncology

## 2019-07-21 ENCOUNTER — Telehealth: Payer: Self-pay | Admitting: Oncology

## 2019-07-21 DIAGNOSIS — K219 Gastro-esophageal reflux disease without esophagitis: Secondary | ICD-10-CM

## 2019-07-21 NOTE — Telephone Encounter (Signed)
Patient called today reporting worsening GERD symptoms thought to be due to history of radiation.  Is been approximately 6 months.  She is currently taking Protonix 20 mg daily in the morning prior to breakfast and Carafate throughout the day.  She is avoiding food triggers and not eating after 3 PM daily.  States she continues to have GERD and last night had a episode that lasted greater than 8 hours.  This morning she tells me her throat feels sore.  We originally spoke back in June for a survivorship visit.  I suggested she increase her Protonix to 40 mg either in divided doses (1 in the morning and 1 at bedtime) and to let me know if symptoms were not improving.  I think patient may need referral to GI given symptoms are worsening.   I recommend she take 40 mg of Protonix in the morning and start taking an H2 blocker at bedtime.  Continue Carafate.  I will touch base with her on Monday or Tuesday of next week to see if symptoms are improving.  Referral placed today.  [place]  She would prefer an appointment at the beginning of September.  Faythe Casa, NP 07/21/2019 11:31 AM

## 2019-07-30 ENCOUNTER — Encounter: Payer: Self-pay | Admitting: Hematology and Oncology

## 2019-08-01 ENCOUNTER — Telehealth: Payer: Self-pay

## 2019-08-01 NOTE — Telephone Encounter (Signed)
I called to inform the patient about her appointmtent times and date/ no answer. I have mailed a AVS to the patient with her appointment time and dates noted.

## 2019-08-02 ENCOUNTER — Telehealth: Payer: Self-pay | Admitting: Oncology

## 2019-08-02 NOTE — Telephone Encounter (Signed)
Spoke to patient and clarified appointment date and time.  She is aware that her labs and CT scans are scheduled and [place].   She continues to have GI/GERD symptoms that are slightly better since adding and Protonix twice a day and H2 blocker at bedtime.  Has appointment with Dr. Vicente Males at the end of September.  Faythe Casa, NP 08/02/2019 2:05 PM

## 2019-09-12 ENCOUNTER — Ambulatory Visit (INDEPENDENT_AMBULATORY_CARE_PROVIDER_SITE_OTHER): Payer: Medicare Other | Admitting: Gastroenterology

## 2019-09-12 ENCOUNTER — Ambulatory Visit
Admission: RE | Admit: 2019-09-12 | Discharge: 2019-09-12 | Disposition: A | Discharge status: Home / Self Care | Payer: Medicare Other | Source: Ambulatory Visit | Attending: Hematology and Oncology | Admitting: Hematology and Oncology

## 2019-09-12 ENCOUNTER — Inpatient Hospital Stay: Payer: Medicare Other | Attending: Hematology and Oncology

## 2019-09-12 ENCOUNTER — Other Ambulatory Visit: Payer: Self-pay

## 2019-09-12 VITALS — BP 137/72 | HR 76 | Temp 98.3°F | Ht 65.0 in | Wt 113.4 lb

## 2019-09-12 DIAGNOSIS — K219 Gastro-esophageal reflux disease without esophagitis: Secondary | ICD-10-CM

## 2019-09-12 DIAGNOSIS — Z9221 Personal history of antineoplastic chemotherapy: Secondary | ICD-10-CM | POA: Diagnosis not present

## 2019-09-12 DIAGNOSIS — Z79899 Other long term (current) drug therapy: Secondary | ICD-10-CM | POA: Insufficient documentation

## 2019-09-12 DIAGNOSIS — C8200 Follicular lymphoma grade I, unspecified site: Secondary | ICD-10-CM | POA: Diagnosis present

## 2019-09-12 DIAGNOSIS — Z8572 Personal history of non-Hodgkin lymphomas: Secondary | ICD-10-CM | POA: Diagnosis not present

## 2019-09-12 DIAGNOSIS — Z800 Family history of malignant neoplasm of digestive organs: Secondary | ICD-10-CM | POA: Diagnosis not present

## 2019-09-12 DIAGNOSIS — Z923 Personal history of irradiation: Secondary | ICD-10-CM | POA: Insufficient documentation

## 2019-09-12 DIAGNOSIS — I10 Essential (primary) hypertension: Secondary | ICD-10-CM | POA: Diagnosis not present

## 2019-09-12 DIAGNOSIS — Z8673 Personal history of transient ischemic attack (TIA), and cerebral infarction without residual deficits: Secondary | ICD-10-CM | POA: Insufficient documentation

## 2019-09-12 LAB — COMPREHENSIVE METABOLIC PANEL
ALT: 26 U/L (ref 0–44)
AST: 26 U/L (ref 15–41)
Albumin: 4.3 g/dL (ref 3.5–5.0)
Alkaline Phosphatase: 62 U/L (ref 38–126)
Anion gap: 6 (ref 5–15)
BUN: 23 mg/dL (ref 8–23)
CO2: 28 mmol/L (ref 22–32)
Calcium: 9.3 mg/dL (ref 8.9–10.3)
Chloride: 98 mmol/L (ref 98–111)
Creatinine, Ser: 0.61 mg/dL (ref 0.44–1.00)
GFR calc Af Amer: >60 mL/min (ref >60)
GFR calc non Af Amer: >60 mL/min (ref >60)
Glucose, Bld: 93 mg/dL (ref 70–99)
Potassium: 4.2 mmol/L (ref 3.5–5.1)
Sodium: 132 mmol/L — ABNORMAL LOW (ref 135–145)
Total Bilirubin: 0.7 mg/dL (ref 0.3–1.2)
Total Protein: 6.7 g/dL (ref 6.5–8.1)

## 2019-09-12 LAB — CBC WITH DIFFERENTIAL/PLATELET
Abs Immature Granulocytes: 0.03 10*3/uL (ref 0.00–0.07)
Basophils Absolute: 0.1 10*3/uL (ref 0.0–0.1)
Basophils Relative: 1 %
Eosinophils Absolute: 0.1 10*3/uL (ref 0.0–0.5)
Eosinophils Relative: 2 %
HCT: 34.3 % — ABNORMAL LOW (ref 36.0–46.0)
Hemoglobin: 11.9 g/dL — ABNORMAL LOW (ref 12.0–15.0)
Immature Granulocytes: 1 %
Lymphocytes Relative: 6 %
Lymphs Abs: 0.4 10*3/uL — ABNORMAL LOW (ref 0.7–4.0)
MCH: 33.6 pg (ref 26.0–34.0)
MCHC: 34.7 g/dL (ref 30.0–36.0)
MCV: 96.9 fL (ref 80.0–100.0)
Monocytes Absolute: 0.7 10*3/uL (ref 0.1–1.0)
Monocytes Relative: 11 %
Neutro Abs: 4.7 10*3/uL (ref 1.7–7.7)
Neutrophils Relative %: 79 %
Platelets: 255 10*3/uL (ref 150–400)
RBC: 3.54 MIL/uL — ABNORMAL LOW (ref 3.87–5.11)
RDW: 13.7 % (ref 11.5–15.5)
WBC: 5.9 10*3/uL (ref 4.0–10.5)
nRBC: 0 % (ref 0.0–0.2)

## 2019-09-12 LAB — URIC ACID: Uric Acid, Serum: 3.1 mg/dL (ref 2.5–7.1)

## 2019-09-12 LAB — LACTATE DEHYDROGENASE: LDH: 140 U/L (ref 98–192)

## 2019-09-12 MED ORDER — IOHEXOL 300 MG/ML  SOLN
100.0000 mL | Freq: Once | INTRAMUSCULAR | Status: AC | PRN
  Administered 2019-09-12: 100 mL via INTRAVENOUS

## 2019-09-12 NOTE — Progress Notes (Signed)
Jonathon Bellows MD, MRCP(U.K) [address]  [place], [place] [address]  Main: [phone]  Fax: [phone]   Gastroenterology Consultation  Referring Provider:     Jacquelin Hawking, NP Primary Care Physician:  Marinda Elk, MD Primary Gastroenterologist:  Dr. Jonathon Bellows  Reason for Consultation:    GERD and esophagitis        HPI:   [name] is a 85 y.o. y/o female referred for GERD.  She has a history of non-Hodgkin's lymphoma status post radiation.  At her office visit in 06/04/2019 with Dr. Mike Gip mention she had issues with acid reflux. CT scan of the abdomen on 09/12/2019 showed interval decrease in the size of retroperitoneal mass about the right aspect of the inferior vena cava posterior to the duodenum and pancreatic head.  Mass measures 4.6 x 2.8 cm.  Large burden of stool seen in the colon.  CT scan of the abdomen on 03/11/2019 also shows a large colonic stool volume suggesting constipation.   She says that in 2003 she developed some discomfort in the upper part of her throat and upper chest associated while swallowing.  He responded at that time to ranitidine.  A few years later it stopped working and was changed to Prilosec which again stopped working after.  Presently takes Gaviscon, Pepcid, Prilosec and still having symptoms.  Describes it as a tightness when she swallows solids or liquids but does not affect water.  Denies any actual abdominal discomfort.  She has had radiation in the past. Past Medical History:  Diagnosis Date  . Cancer [facility]) 2013   Non-Hodgkin Lymphoma with chemo and rad tx  . Follicular lymphoma grade I ([place]) 08/06/2002  . 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 Hypertension   . Meniere's disease    with bilateral hearing loss  . Mini stroke [facility])    summer 2014  . Osteoporosis     Past Surgical History:  Procedure Laterality Date  . BONE MARROW BIOPSY  08/09/2002  . BREAST  LUMPECTOMY  1950s   benign  . CATARACT EXTRACTION  03/2009  . LYMPH NODE BIOPSY  08/07/2002   cervical  . TUBAL LIGATION      Prior to Admission medications   Medication Sig Start Date End Date Taking? Authorizing Provider  Acetaminophen (TYLENOL ARTHRITIS EXT RELIEF PO) Take 1 tablet by mouth daily as needed (pain).    [provider]  allopurinol (ZYLOPRIM) 300 MG tablet TAKE ONE TABLET BY MOUTH ONE TIME DAILY  01/18/19   Lequita Asal, MD  AZELASTINE & FLUTICASONE NA 1 spray each nase twice daily.    [provider]  calcium-vitamin D (CALCIUM 500/D) 500-200 MG-UNIT tablet Take 1 tablet by mouth 2 (two) times daily.     [provider]  denosumab (PROLIA) 60 MG/ML SOSY injection Inject 60 mg into the skin every 6 (six) months.     [provider]  Flax Oil-Fish Oil-Borage Oil (FISH OIL-FLAX OIL-BORAGE OIL) CAPS Take 1,000-1,400 mg by mouth daily.    [provider]  Multiple Vitamin (MULTI-VITAMINS) TABS Take 1 tablet by mouth daily.     [provider]  Multiple Vitamins-Minerals (PRESERVISION AREDS 2 PO) Take 1 tablet by mouth daily.     [provider]  omeprazole (PRILOSEC) 40 MG capsule Take 40 mg by mouth daily.    [provider]  polyethylene glycol (MIRALAX / GLYCOLAX) 17 g packet Take  17 g by mouth daily.    [provider]  Probiotic Product (PROBIOTIC DAILY PO) Take 2 tablets by mouth daily.     [provider]  sucralfate (CARAFATE) 1 g tablet Take 1 tablet (1 g total) by mouth 3 (three) times daily. Dissolve in warm water, take 30 minutes meals 03/02/19   Noreene Filbert, MD  traZODone (DESYREL) 50 MG tablet TAKE 1 TO 2 TABLETS BY MOUTH DAILY AT BEDTIME AS NEEDED 05/30/15 10/02/37  [provider]  triamcinolone cream (KENALOG) 0.1 % Apply 1 application topically 2 (two) times daily.     [provider]  Turmeric Curcumin 500 MG CAPS Take 1 capsule by mouth daily.      [provider]    Family History  Problem Relation Age of Onset  . Cancer Father        Throat  . Cancer Sister 74       Colon     Social History   Tobacco Use  . Smoking status: Never Smoker  . Smokeless tobacco: Never Used  Substance Use Topics  . Alcohol use: No    Alcohol/week: 0.0 standard drinks  . Drug use: No    Allergies as of 09/12/2019 - Review Complete 09/12/2019  Allergen Reaction Noted  . Lisinopril Cough 09/26/2015    Review of Systems:    All systems reviewed and negative except where noted in HPI.   Physical Exam:  BP 137/72   Pulse 76   Temp 98.3 F (36.8 C)   Ht _0  (1.651 m)   Wt 113 lb 6.4 oz (51.4 kg)   BMI 18.87 kg/m  No LMP recorded. Patient is postmenopausal. Psych:  Alert and cooperative. Normal mood and affect. General:   Alert,  Well-developed, well-nourished, pleasant and cooperative in NAD Head:  Normocephalic and atraumatic. Eyes:  Sclera clear, no icterus.   Conjunctiva pink. Ears:  Normal auditory acuity. Nose:  No deformity, discharge, or lesions. Mouth:  No deformity or lesions,oropharynx pink & moist. Neck:  Supple; no masses or thyromegaly. Lungs:  Respirations even and unlabored.  Clear throughout to auscultation.   No wheezes, crackles, or rhonchi. No acute distress. Heart:  Regular rate and rhythm; no murmurs, clicks, rubs, or gallops. Abdomen:  Normal bowel sounds.  No bruits.  Soft, non-tender and non-distended without masses, hepatosplenomegaly or hernias noted.  No guarding or rebound tenderness.    Neurologic:  Alert and oriented x3;  grossly normal neurologically. Skin:  Intact without significant lesions or rashes. No jaundice. Lymph Nodes:  No significant cervical adenopathy. Psych:  Alert and cooperative. Normal mood and affect.  Imaging Studies: Ct Abdomen Pelvis W Contrast  Result Date: 09/12/2019 CLINICAL DATA:  Follow-up follicular lymphoma EXAM: CT ABDOMEN AND PELVIS WITH CONTRAST TECHNIQUE:  Multidetector CT imaging of the abdomen and pelvis was performed using the standard protocol following bolus administration of intravenous contrast. CONTRAST:  175m OMNIPAQUE IOHEXOL 300 MG/ML SOLN, additional oral enteric contrast COMPARISON:  03/11/2019, PET-CT, 11/08/2018 FINDINGS: Lower chest: No acute abnormality. Hepatobiliary: No solid liver abnormality is seen. No gallstones, gallbladder wall thickening, or obstructing lesion identified. There is mild intrahepatic biliary ductal dilatation without dilatation of the common bile duct or other abnormality to the ampulla, this appearance similar to prior examination. Pancreas: Unremarkable. No pancreatic ductal dilatation or surrounding inflammatory changes. Spleen: Normal in size without significant abnormality. Adrenals/Urinary Tract: Adrenal glands are unremarkable. Kidneys are normal, without renal calculi, solid lesion, or hydronephrosis. Bladder is unremarkable. Stomach/Bowel:  Stomach is within normal limits. Appendix is not clearly visualized. No evidence of bowel wall thickening, distention, or inflammatory changes. Large burden of stool and stool balls in the colon and rectum. Vascular/Lymphatic: Aortic atherosclerosis. 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 There has been significant interval decrease in size of a retroperitoneal mass about the right aspect of the inferior vena cava, posterior to the duodenum and pancreatic head and anterior to the right renal pelvis. This mass measures 4.6 x 2.8 cm, previously 6.5 x 4.1 cm when measured similarly (series 2, image 22). There is redemonstrated, adjacent post treatment retroperitoneal soft tissue about the inferior vena cava and aorta, which is similar in appearance (series 2, image 28). There are no discretely enlarged lymph nodes in the abdomen or pelvis. Reproductive: No mass or other significant abnormality. Other: No abdominal wall  hernia or abnormality. No abdominopelvic ascites. Musculoskeletal: Levoscoliosis of the lumbar spine. IMPRESSION: 1. There has been significant interval decrease in size of a retroperitoneal mass about the right aspect of the inferior vena cava, posterior to the duodenum and pancreatic head and anterior to the right renal pelvis. This mass measures 4.6 x 2.8 cm, previously 6.5 x 4.1 cm when measured similarly (series 2, image 22). There is redemonstrated, adjacent post treatment retroperitoneal soft tissue about the inferior vena cava and aorta, which is similar in appearance (series 2, image 28). Findings are consistent with ongoing treatment response. 2. There are no discretely enlarged lymph nodes in the abdomen or pelvis. 3.  Large burden of stool in the colon. 4.  Aortic Atherosclerosis (ICD10-I70.0). Electronically Signed   By: Eddie Candle M.D.   On: 09/12/2019 10:03    Assessment and Plan:   [name] is a 85 y.o. y/o female has been referred for GERD.  Her symptoms are all esophageal.  It could be from acid or nonacid reflux.  The other differential to consider is an esophageal spasm.  The fact that she has responded to acid suppression in the past suggest strongly that there is definitely a component of acid related symptoms.  Unclear why she only has had response for a short period of time.  Is also possible radiation might have caused it but I suspect that she did not have any radiation in her mediastinum and most of it was in her abdomen.  She also has constipation which she says responds well to MiraLAX and does not feel that it is an issue right now, the upper abdominal discomfort does not respond favorably when she has a bowel movement and not connected to the same per patient's history.  Plan 1.  EGD. 2.  Commence on Dexilant 60 mg - 2 weeks samples provided. 3.  Based on the results may also consider a trial of amitriptyline  I have discussed alternative options, risks &  benefits,  which include, but are not limited to, bleeding, infection, perforation,respiratory complication & drug reaction.  The patient agrees with this plan & written consent will be obtained.     Follow up in 4 weeks  Dr Jonathon Bellows MD,MRCP(U.K)

## 2019-09-13 ENCOUNTER — Other Ambulatory Visit: Payer: Self-pay

## 2019-09-13 DIAGNOSIS — C8200 Follicular lymphoma grade I, unspecified site: Secondary | ICD-10-CM

## 2019-09-13 NOTE — Progress Notes (Signed)
[facility]  [address] [place], [place] [address] Phone: [phone]  Fax: [phone]   Clinic Day:  09/14/2019  Referring physician: Marinda Elk, MD  Chief Complaint: [name] is a 85 y.o. female with a history of stage IIIA follicular non-Hodgkin's lymphoma  who is seen for 3 month assessment.  HPI: The patient was last seen in the medical oncology clinic on 06/09/2019. At that time,  she denied any new concerns.  Weight was stable.  Exam revealed no adenopathy or hepatosplenomegaly.  She received Prolia.   Abdomen and pelvis CT on 09/12/2019 revealed a significant interval decrease in size of a retroperitoneal mass that measured 4.6 x 2.8 cm (previously 6.5 x 4.1 cm). There was redemonstrated, adjacent post treatment retroperitoneal soft tissue about the inferior vena cava and aorta.  Findings were consistent with ongoing treatment response. There was no discretely enlarged lymph nodes in the abdomen or pelvis.  During the interim, she believes radiation has made her reflux much worse.  She spoke with Lawrence Marseilles to get an appt with Dr.Anna, gastroenterologist.  He took her off of her anti-reflux medicine to try her on some samples of Dexilant.  She is taking 2 days of the medication and already notes some small improvement.  Endoscopy is scheduled for 09/23/2019.   She is doing fine on Prolia.    Labs from 09/12/2019: Hematocrit 34.3, hemoglobin 11.9, MCV 96.9, platelets 255000, WBC 5900.   Past Medical History:  Diagnosis Date  . Cancer [facility]) 2013   Non-Hodgkin Lymphoma with chemo and rad tx  . Follicular lymphoma grade I ([place]) 08/06/2002  . 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 Hypertension   . Meniere's disease    with bilateral hearing loss  . Mini stroke [facility])    summer 2014  . Osteoporosis     Past Surgical History:  Procedure Laterality Date  . BONE MARROW BIOPSY  08/09/2002  .  BREAST LUMPECTOMY  1950s   benign  . CATARACT EXTRACTION  03/2009  . LYMPH NODE BIOPSY  08/07/2002   cervical  . TUBAL LIGATION      Family History  Problem Relation Age of Onset  . Cancer Father        Throat  . Cancer Sister 21       Colon    Social History:  reports that she has never smoked. She has never used smokeless tobacco. She reports that she does not drink alcohol or use drugs. She lives in [place]. She and her husband are getting restless staying at home during the pandemic. The patient is alone today.  Allergies:  Allergies  Allergen Reactions  . Lisinopril Cough    Current Medications: Current Outpatient Medications  Medication Sig Dispense Refill  . Acetaminophen (TYLENOL ARTHRITIS EXT RELIEF PO) Take 1 tablet by mouth daily as needed (pain).    [name] allopurinol (ZYLOPRIM) 300 MG tablet TAKE ONE TABLET BY MOUTH ONE TIME DAILY  90 tablet 0  . AZELASTINE & FLUTICASONE NA 1 spray each nase twice daily.    . calcium-vitamin D (CALCIUM 500/D) 500-200 MG-UNIT tablet Take 1 tablet by mouth 2 (two) times daily.     [name] denosumab (PROLIA) 60 MG/ML SOSY injection Inject 60 mg into the skin every 6 (six) months.     . Flax Oil-Fish Oil-Borage Oil (FISH OIL-FLAX OIL-BORAGE OIL) CAPS Take 1,000-1,400 mg by mouth daily.    . Multiple Vitamin (MULTI-VITAMINS)  TABS Take 1 tablet by mouth daily.     . Multiple Vitamins-Minerals (PRESERVISION AREDS 2 PO) Take 1 tablet by mouth daily.     [name] omeprazole (PRILOSEC) 40 MG capsule Take 40 mg by mouth daily.    . polyethylene glycol (MIRALAX / GLYCOLAX) 17 g packet Take 17 g by mouth daily.    . Probiotic Product (PROBIOTIC DAILY PO) Take 2 tablets by mouth daily.     . sucralfate (CARAFATE) 1 g tablet Take 1 tablet (1 g total) by mouth 3 (three) times daily. Dissolve in warm water, take 30 minutes meals 90 tablet 6  . traZODone (DESYREL) 50 MG tablet TAKE 1 TO 2 TABLETS BY MOUTH DAILY AT BEDTIME AS NEEDED    . triamcinolone cream  (KENALOG) 0.1 % Apply 1 application topically 2 (two) times daily.     . Turmeric Curcumin 500 MG CAPS Take 1 capsule by mouth daily.      No current facility-administered medications for this visit.     Review of Systems  Constitutional: Positive for weight loss (1 lb). Negative for chills, diaphoresis, fever and malaise/fatigue.       Feels "fine".  HENT: Negative.  Negative for congestion, ear discharge, ear pain, nosebleeds, sinus pain and sore throat.   Eyes: Negative.  Negative for blurred vision, double vision, photophobia and pain.  Respiratory: Negative.  Negative for cough, hemoptysis, sputum production and shortness of breath.   Cardiovascular: Negative.  Negative for chest pain, palpitations, orthopnea, leg swelling and PND.  Gastrointestinal: Positive for heartburn. Negative for abdominal pain, blood in stool, constipation (takes Miralax), diarrhea, melena, nausea and vomiting.  Genitourinary: Positive for frequency (5 times at night on 09/13/2019). Negative for dysuria, hematuria and urgency.  Musculoskeletal: Negative.  Negative for back pain, falls, joint pain, myalgias and neck pain.  Skin: Negative.  Negative for itching and rash.  Neurological: Positive for tremors (essential). Negative for dizziness, tingling, sensory change, speech change, focal weakness, weakness and headaches.  Endo/Heme/Allergies: Negative.  Does not bruise/bleed easily.  Psychiatric/Behavioral: Negative.  Negative for depression and memory loss. The patient is not nervous/anxious and does not have insomnia.   All other systems reviewed and are negative.  Performance status (ECOG): 1 - Symptomatic but completely ambulatory  Vitals Blood pressure (!) 133/56, pulse 64, temperature (!) 97 F (36.1 C), temperature source Tympanic, resp. rate 18, height 5' 5"  (1.651 m), weight 111 lb 1.8 oz (50.4 kg), SpO2 100 %.   Physical Exam  Constitutional: She is oriented to person, place, and time. She appears  well-developed and well-nourished. No distress.  HENT:  Head: Normocephalic and atraumatic.  Mouth/Throat: Oropharynx is clear and moist. No oropharyngeal exudate.  Curly gray hair. Mask.  Eyes: Pupils are equal, round, and reactive to light. Conjunctivae and EOM are normal. No scleral icterus.  Gold rimmed glasses.  Blue eyes.   Neck: Normal range of motion. Neck supple. No JVD present.  Cardiovascular: Normal rate, regular rhythm and normal heart sounds.  No murmur heard. Pulmonary/Chest: Effort normal and breath sounds normal. No respiratory distress. She has no wheezes.  Abdominal: Soft. Bowel sounds are normal. She exhibits no distension and no mass. There is no abdominal tenderness. There is no rebound and no guarding.  Musculoskeletal: Normal range of motion.        General: No edema.  Lymphadenopathy:    She has no cervical adenopathy.    She has no axillary adenopathy.       Right: No  supraclavicular adenopathy present.       Left: No supraclavicular adenopathy present.  Neurological: She is alert and oriented to person, place, and time.  Skin: Skin is warm and dry. No rash noted. She is not diaphoretic. No erythema. No pallor.  Psychiatric: She has a normal mood and affect. Her behavior is normal. Judgment and thought content normal.  Nursing note and vitals reviewed.   Appointment on 09/12/2019  Component Date Value Ref Range Status  . Uric Acid, Serum 09/12/2019 3.1  2.5 - 7.1 mg/dL Final   Performed at [facility], [address], [place], [place] [address]  . LDH 09/12/2019 140  98 - 192 U/L Final   Performed at [facility], [address], [place], [place] [address]  . Sodium 09/12/2019 132* 135 - 145 mmol/L Final  . Potassium 09/12/2019 4.2  3.5 - 5.1 mmol/L Final  . Chloride 09/12/2019 98  98 - 111 mmol/L Final  . CO2 09/12/2019 28  22 - 32 mmol/L Final  . Glucose, Bld 09/12/2019 93  70 - 99 mg/dL Final  . BUN 09/12/2019 23  8 - 23  mg/dL Final  . Creatinine, Ser 09/12/2019 0.61  0.44 - 1.00 mg/dL Final  . Calcium 09/12/2019 9.3  8.9 - 10.3 mg/dL Final  . Total Protein 09/12/2019 6.7  6.5 - 8.1 g/dL Final  . Albumin 09/12/2019 4.3  3.5 - 5.0 g/dL Final  . AST 09/12/2019 26  15 - 41 U/L Final  . ALT 09/12/2019 26  0 - 44 U/L Final  . Alkaline Phosphatase 09/12/2019 62  38 - 126 U/L Final  . Total Bilirubin 09/12/2019 0.7  0.3 - 1.2 mg/dL Final  . GFR calc non Af Amer 09/12/2019 >60  >60 mL/min Final  . GFR calc Af Amer 09/12/2019 >60  >60 mL/min Final  . Anion gap 09/12/2019 6  5 - 15 Final   Performed at [facility], [address], [place], [place] [address]  . WBC 09/12/2019 5.9  4.0 - 10.5 K/uL Final  . RBC 09/12/2019 3.54* 3.87 - 5.11 MIL/uL Final  . Hemoglobin 09/12/2019 11.9* 12.0 - 15.0 g/dL Final  . HCT 09/12/2019 34.3* 36.0 - 46.0 % Final  . MCV 09/12/2019 96.9  80.0 - 100.0 fL Final  . MCH 09/12/2019 33.6  26.0 - 34.0 pg Final  . MCHC 09/12/2019 34.7  30.0 - 36.0 g/dL Final  . RDW 09/12/2019 13.7  11.5 - 15.5 % Final  . Platelets 09/12/2019 255  150 - 400 K/uL Final  . nRBC 09/12/2019 0.0  0.0 - 0.2 % Final  . Neutrophils Relative % 09/12/2019 79  % Final  . Neutro Abs 09/12/2019 4.7  1.7 - 7.7 K/uL Final  . Lymphocytes Relative 09/12/2019 6  % Final  . Lymphs Abs 09/12/2019 0.4* 0.7 - 4.0 K/uL Final  . Monocytes Relative 09/12/2019 11  % Final  . Monocytes Absolute 09/12/2019 0.7  0.1 - 1.0 K/uL Final  . Eosinophils Relative 09/12/2019 2  % Final  . Eosinophils Absolute 09/12/2019 0.1  0.0 - 0.5 K/uL Final  . Basophils Relative 09/12/2019 1  % Final  . Basophils Absolute 09/12/2019 0.1  0.0 - 0.1 K/uL Final  . Immature Granulocytes 09/12/2019 1  % Final  . Abs Immature Granulocytes 09/12/2019 0.03  0.00 - 0.07 K/uL Final   Performed at [facility], [address], [place], [place] [address]    Assessment:  Sylvia Burow  Sanchez is a 85 y.o. female with a history of stage  IIIA follicular non-Hodgkin's lymphoma. She presented in the summer of 2003 with an epigastric and left upper quadrant mass and left arm weakness with decreased mobility. Abdominal ultrasound revealed a large mass in the head of the pancreas with multiple enlarged lymph nodes and a large lobulated mass in the right retroperitoneal area. MRI of the left brachial plexus revealed a 2.5 cm soft tissue mass and abnormal lymph node along the left brachial plexus and in the supraclavicular region.  Cervical node biopsyon 08/06/2002 revealed a grade I follicular non-Hodgkin's lymphoma. Bone marrowon 08/09/2002 was negative. Lumbar puncture was negative. She receivedLeukeran and Rituxan. As her arm pain and mobility was not relieved, she received local radiationto the left brachial plexus area. Leukeran continued through 02/28/2013 (discontinued secondary side effects).  She developed progressive disease in 10/2003. Patient received CVPchemotherapy beginning 01/02/2004. In 06/2004 she was switched to CNOP. She received 8 cycles completing on 01/15/2005. She began maintenance Rituxanon 04/23/2004 (4 weekly doses every 3 months for 2 years). Therapy completed on 09/13/2008.   Excision of a 1 cm right preauricular masson 10/04/2017 revealed a low grade follicular lymphoma. CT guided biopsyof the retroperitoneal nodal masson 11/20/2017 revealed grade I follicular lymphoma. Immunohistochemistry revealed positive for CD10, BCL-2, BCL-6, Ki67 (15-20%) and negative for cyclin D1 and CD5. Flow cytometry revealed a CD10+ clonal B-cell population.   She received 4 weekly cycles of Rituxan(12/24/2017 - 01/14/2018). She is s/p 3 cycles of maintenance Rituxan(03/24/2018 - 10/08/2018).  PET scanon 11/08/2018 revealed a 5.5 x 7.3 cm hypermetabolic (SUV 43.3) right retroperitoneal nodal mass and aortocaval lymph node. CT guided biopsyon 29/51/8841 confirmed follicular lymphoma, grade I.  She received  radiation(IMRT) of 3000 cGy to the retroperitoneal mass from 01/11/2019 - 01/31/2019.  Abdomen and pelvis CTon 03/11/2019 revealed partial treatment response. Right retroperitoneal nodal mass and aortocaval adenopathy haddecreased in size (7.1 x 5.8 cm to 6.5 x 4.1 cm).There was decreased mass effect on the IVC. There were no new sites of disease.  Abdomen and pelvis CT on 09/12/2019 revealed a significant interval decrease in size of a retroperitoneal mass that measured 4.6 x 2.8 cm (previously 6.5 x 4.1 cm). There was redemonstrated, adjacent post treatment retroperitoneal soft tissue about the inferior vena cava and aorta.  Findings were consistent with ongoing treatment response. There was no discretely enlarged lymph nodes in the abdomen or pelvis.  The following studies were negativeon 10/23/2017: hepatitis B core antibody, hepatitis B surface antigen, hepatitis C antibody. G6PD assay was normal.  She was admitted to [facility] 08/15/2015 - 08/25/2015 with bloody diarrhea then hemolytic uremic syndromerelated to E. coli 0157. She had a complicated course with acute renal failure and anemia. She was then in rehab for 3 weeks. She continues to work on her balance.  Bone densityon 10/27/2017 revealed osteoporosiswith a T-score of -3.8 in the left forearm radius. She began every 6 month Proliaon 04/20/2018 (last 06/09/2019).  Symptomatically, she has had issues with reflux following radiation. She denies any B symptoms.  Exam reveals no adenopathy or hepatosplenomegaly.  Plan: 1.   Labs today:  CBC with diff, CMP, LDH, uric acid. 2.   Stage IIIAfollicular non-Hodgkin's lymphoma Patient is s/p IMRT to the retroperitoneal mass. Clinically, she is doing well.  CT scan on 09/12/2019 reveals a significant improvement in the retroperitoneal mass.   Images personally reviewed.  Agree with radiology interpretation.  Discuss plan for ongoing surveillance. 3.  Osteoporosis Calcium 9.3.  Creatinine 0.61.               Continue calcium and vitamin D. Bone density scheduled on 10/31/2019.  Continue Prolia every 6 months. 4.   Reflux  Patient followed by Dr Vicente Males, GI.  EGD on 09/23/2019. 5.   RTC on 12/19/2019 for labs (BMP) and Prolia per patient request. 6.   RTC in 6 months for MD assessment and labs (CBC with diff, CMP, LDH, uric acid).  I discussed the assessment and treatment plan with the patient.  The patient was provided an opportunity to ask questions and all were answered.  The patient agreed with the plan and demonstrated an understanding of the instructions.  The patient was advised to call back if the symptoms worsen or if the condition fails to improve as anticipated.  I provided 25 minutes (1:15 PM - 1:40 PM) of face-to-face time during this this encounter and > 50% was spent counseling as documented under my assessment and plan.    Lequita Asal, MD, PhD    09/14/2019, 1:38 PM  I, Samul Dada, am acting as a scribe for Lequita Asal, MD.  I, [place] Mike Gip, MD, have reviewed the above documentation for accuracy and completeness, and I agree with the above.

## 2019-09-14 ENCOUNTER — Other Ambulatory Visit: Payer: Self-pay

## 2019-09-14 ENCOUNTER — Inpatient Hospital Stay (HOSPITAL_BASED_OUTPATIENT_CLINIC_OR_DEPARTMENT_OTHER): Payer: Medicare Other | Admitting: Hematology and Oncology

## 2019-09-14 ENCOUNTER — Encounter: Payer: Self-pay | Admitting: Hematology and Oncology

## 2019-09-14 VITALS — BP 133/56 | HR 64 | Temp 97.0°F | Resp 18 | Ht 65.0 in | Wt 111.1 lb

## 2019-09-14 DIAGNOSIS — C8200 Follicular lymphoma grade I, unspecified site: Secondary | ICD-10-CM

## 2019-09-14 DIAGNOSIS — M810 Age-related osteoporosis without current pathological fracture: Secondary | ICD-10-CM

## 2019-09-14 DIAGNOSIS — Z8572 Personal history of non-Hodgkin lymphomas: Secondary | ICD-10-CM | POA: Diagnosis not present

## 2019-09-14 DIAGNOSIS — Z7189 Other specified counseling: Secondary | ICD-10-CM | POA: Diagnosis not present

## 2019-09-14 NOTE — Progress Notes (Signed)
Patient states she had diarrhea after the scan yesterday. She also states she started having frequency at night x 1 month

## 2019-09-16 ENCOUNTER — Other Ambulatory Visit: Payer: Self-pay

## 2019-09-16 ENCOUNTER — Ambulatory Visit
Admission: RE | Admit: 2019-09-16 | Discharge: 2019-09-16 | Disposition: A | Discharge status: Home / Self Care | Payer: Medicare Other | Source: Ambulatory Visit | Attending: Radiation Oncology | Admitting: Radiation Oncology

## 2019-09-16 ENCOUNTER — Encounter: Payer: Self-pay | Admitting: Radiation Oncology

## 2019-09-16 VITALS — BP 133/57 | HR 75 | Temp 97.0°F | Resp 18 | Wt 111.7 lb

## 2019-09-16 DIAGNOSIS — Z923 Personal history of irradiation: Secondary | ICD-10-CM | POA: Diagnosis not present

## 2019-09-16 DIAGNOSIS — C8200 Follicular lymphoma grade I, unspecified site: Secondary | ICD-10-CM

## 2019-09-16 DIAGNOSIS — C8293 Follicular lymphoma, unspecified, intra-abdominal lymph nodes: Secondary | ICD-10-CM | POA: Diagnosis present

## 2019-09-16 DIAGNOSIS — K219 Gastro-esophageal reflux disease without esophagitis: Secondary | ICD-10-CM | POA: Diagnosis not present

## 2019-09-16 NOTE — Progress Notes (Signed)
Radiation Oncology Follow up Note  Name: [name]   Date:   09/16/2019 MRN:  [id number] DOB: April 13, 1932    This 85 y.o. female presents to the clinic today for 33-month follow-up status post abdominal radiation therapy for large mass in her abdomen in patient with known follicular B-cell lymphoma.  REFERRING PROVIDER: Marinda Elk, MD  HPI: Patient is a 85 year old female now seen out 5 months having completed involved field radiation therapy to her large abdominal mass and patient with known follicular B-cell lymphoma.  Seen today in routine follow-up she is doing well she states her bowels have finally straightened out she did change her diet somewhat.  She is having no abdominal complaints at this time.  She had a recent CT scan which I have reviewed.  Showing significant interval decrease in the size of the retroperitoneal mass in the right aspect of the inferior vena cava.  She still has some reflux and is taking a proton pump inhibitor.  COMPLICATIONS OF TREATMENT: none  FOLLOW UP COMPLIANCE: keeps appointments   PHYSICAL EXAM:  BP (!) 133/57   Pulse 75   Temp (!) 97 F (36.1 C)   Resp 18   Wt 111 lb 11.2 oz (50.7 kg)   BMI 18.59 kg/m  Well-developed well-nourished patient in NAD. HEENT reveals PERLA, EOMI, discs not visualized.  Oral cavity is clear. No oral mucosal lesions are identified. Neck is clear without evidence of cervical or supraclavicular adenopathy. Lungs are clear to A&P. Cardiac examination is essentially unremarkable with regular rate and rhythm without murmur rub or thrill. Abdomen is benign with no organomegaly or masses noted. Motor sensory and DTR levels are equal and symmetric in the upper and lower extremities. Cranial nerves II through XII are grossly intact. Proprioception is intact. No peripheral adenopathy or edema is identified. No motor or sensory levels are noted. Crude visual fields are within normal range.  RADIOLOGY RESULTS: CT scans  reviewed and compared to prior studies and compatible with above-stated findings  PLAN: Present time patient is doing well.  Based on her age and ability to attend doctors appointments I am going to turn follow-up care over to medical oncology and Dr. Mike Gip.  I be happy to reevaluate the patient at any time should further treatment be indicated.  Patient knows to call with any concerns at any time.  I would like to take this opportunity to thank you for allowing me to participate in the care of your patient.Noreene Filbert, MD

## 2019-09-20 ENCOUNTER — Other Ambulatory Visit: Payer: Self-pay

## 2019-09-20 ENCOUNTER — Other Ambulatory Visit
Admission: RE | Admit: 2019-09-20 | Discharge: 2019-09-20 | Disposition: A | Discharge status: Home / Self Care | Payer: Medicare Other | Source: Ambulatory Visit | Attending: Gastroenterology | Admitting: Gastroenterology

## 2019-09-20 DIAGNOSIS — Z01812 Encounter for preprocedural laboratory examination: Secondary | ICD-10-CM | POA: Insufficient documentation

## 2019-09-20 DIAGNOSIS — Z20828 Contact with and (suspected) exposure to other viral communicable diseases: Secondary | ICD-10-CM | POA: Diagnosis not present

## 2019-09-20 LAB — SARS CORONAVIRUS 2 (TAT 6-24 HRS): SARS Coronavirus 2: NEGATIVE

## 2019-09-22 ENCOUNTER — Other Ambulatory Visit: Payer: Self-pay | Admitting: *Deleted

## 2019-09-23 ENCOUNTER — Ambulatory Visit: Payer: Medicare Other | Admitting: Certified Registered"

## 2019-09-23 ENCOUNTER — Ambulatory Visit
Admission: RE | Admit: 2019-09-23 | Discharge: 2019-09-23 | Disposition: A | Discharge status: Home / Self Care | Payer: Medicare Other | Attending: Gastroenterology | Admitting: Gastroenterology

## 2019-09-23 ENCOUNTER — Other Ambulatory Visit: Payer: Self-pay

## 2019-09-23 ENCOUNTER — Encounter
Admission: RE | Disposition: A | Discharge status: Home / Self Care | Payer: Self-pay | Source: Home / Self Care | Attending: Gastroenterology

## 2019-09-23 DIAGNOSIS — Z8572 Personal history of non-Hodgkin lymphomas: Secondary | ICD-10-CM | POA: Diagnosis not present

## 2019-09-23 DIAGNOSIS — Z79899 Other long term (current) drug therapy: Secondary | ICD-10-CM | POA: Insufficient documentation

## 2019-09-23 DIAGNOSIS — Z923 Personal history of irradiation: Secondary | ICD-10-CM | POA: Insufficient documentation

## 2019-09-23 DIAGNOSIS — M810 Age-related osteoporosis without current pathological fracture: Secondary | ICD-10-CM | POA: Diagnosis not present

## 2019-09-23 DIAGNOSIS — Z8673 Personal history of transient ischemic attack (TIA), and cerebral infarction without residual deficits: Secondary | ICD-10-CM | POA: Diagnosis not present

## 2019-09-23 DIAGNOSIS — I10 Essential (primary) hypertension: Secondary | ICD-10-CM | POA: Insufficient documentation

## 2019-09-23 DIAGNOSIS — H9193 Unspecified hearing loss, bilateral: Secondary | ICD-10-CM | POA: Diagnosis not present

## 2019-09-23 DIAGNOSIS — K219 Gastro-esophageal reflux disease without esophagitis: Secondary | ICD-10-CM

## 2019-09-23 DIAGNOSIS — Z9221 Personal history of antineoplastic chemotherapy: Secondary | ICD-10-CM | POA: Insufficient documentation

## 2019-09-23 HISTORY — PX: ESOPHAGOGASTRODUODENOSCOPY (EGD) WITH PROPOFOL: SHX5813

## 2019-09-23 HISTORY — DX: Gastro-esophageal reflux disease without esophagitis: K21.9

## 2019-09-23 HISTORY — DX: Unspecified osteoarthritis, unspecified site: M19.90

## 2019-09-23 HISTORY — DX: Cardiac arrhythmia, unspecified: I49.9

## 2019-09-23 SURGERY — ESOPHAGOGASTRODUODENOSCOPY (EGD) WITH PROPOFOL
Anesthesia: General

## 2019-09-23 MED ORDER — SODIUM CHLORIDE 0.9 % IV SOLN
INTRAVENOUS | Status: DC
  Administered 2019-09-23: 09:00:00 via INTRAVENOUS

## 2019-09-23 MED ORDER — PROPOFOL 500 MG/50ML IV EMUL
INTRAVENOUS | Status: DC | PRN
  Administered 2019-09-23: 150 ug/kg/min via INTRAVENOUS

## 2019-09-23 MED ORDER — PROPOFOL 10 MG/ML IV BOLUS
INTRAVENOUS | Status: DC | PRN
  Administered 2019-09-23: 40 mg via INTRAVENOUS

## 2019-09-23 MED ORDER — PROPOFOL 10 MG/ML IV BOLUS
INTRAVENOUS | Status: AC
  Filled 2019-09-23: qty 40

## 2019-09-23 MED ORDER — LIDOCAINE HCL (CARDIAC) PF 100 MG/5ML IV SOSY
PREFILLED_SYRINGE | INTRAVENOUS | Status: DC | PRN
  Administered 2019-09-23: 50 mg via INTRAVENOUS

## 2019-09-23 NOTE — Anesthesia Preprocedure Evaluation (Signed)
Anesthesia Evaluation  Patient identified by MRN, date of birth, ID band Patient awake    Reviewed: Allergy & Precautions, H&P , NPO status , Patient's Chart, lab work & pertinent test results, reviewed documented beta blocker date and time   History of Anesthesia Complications Negative for: history of anesthetic complications  Airway Mallampati: II  TM Distance: >3 FB Neck ROM: full    Dental no notable dental hx. (+) Dental Advidsory Given, Teeth Intact   Pulmonary neg pulmonary ROS,    Pulmonary exam normal        Cardiovascular Exercise Tolerance: Good hypertension, (-) angina(-) Past MI and (-) Cardiac Stents Normal cardiovascular exam+ dysrhythmias + Valvular Problems/Murmurs      Neuro/Psych neg Seizures TIAnegative psych ROS   GI/Hepatic Neg liver ROS, GERD  ,  Endo/Other  negative endocrine ROS  Renal/GU negative Renal ROS  negative genitourinary   Musculoskeletal   Abdominal   Peds  Hematology negative hematology ROS (+)   Anesthesia Other Findings Past Medical History: No date: Arthritis 2013: Cancer ([place])     Comment:  Non-Hodgkin Lymphoma with chemo and rad tx No date: Dysrhythmia 08/06/2002: Follicular lymphoma grade I ([facility]) No date: GERD (gastroesophageal reflux disease) 08/13/2015: Hemolytic uremic syndrome ([facility])     Comment:  (E coli 0157) No date: Hypertension No date: Meniere's disease     Comment:  with bilateral hearing loss No date: Mini stroke [facility])     Comment:  summer 2014 No date: Osteoporosis   Reproductive/Obstetrics negative OB ROS                             Anesthesia Physical Anesthesia Plan  ASA: III  Anesthesia Plan: General   Post-op Pain Management:    Induction: Intravenous  PONV Risk Score and Plan: 3  Airway Management Planned: Natural Airway and Nasal Cannula  Additional Equipment:   Intra-op Plan:   Post-operative Plan:    Informed Consent: I have reviewed the patients History and Physical, chart, labs and discussed the procedure including the risks, benefits and alternatives for the proposed anesthesia with the patient or authorized representative who has indicated his/her understanding and acceptance.     Dental Advisory Given  Plan Discussed with: Anesthesiologist, CRNA and Surgeon  Anesthesia Plan Comments:         Anesthesia Quick Evaluation

## 2019-09-23 NOTE — Op Note (Signed)
[facility] Gastroenterology Patient Name: [name] Procedure Date: 09/23/2019 9:15 AM MRN: [id number] Account #: [id number] Date of Birth: [birth year] Admit Type: Outpatient Age: 85 Room: [facility] ENDO ROOM 1 Gender: Female Note Status: Finalized Procedure:            Upper GI endoscopy Indications:          Follow-up of esophageal reflux Providers:            Jonathon Bellows MD, MD Referring MD:         Precious Bard, MD (Referring MD) Medicines:            Monitored Anesthesia Care Complications:        No immediate complications. Procedure:            Pre-Anesthesia Assessment:                       - Prior to the procedure, a History and Physical was                        performed, and patient medications, allergies and                        sensitivities were reviewed. The patient's tolerance of                        previous anesthesia was reviewed.                       - The risks and benefits of the procedure and the                        sedation options and risks were discussed with the                        patient. All questions were answered and informed                        consent was obtained.                       - ASA Grade Assessment: III - A patient with severe                        systemic disease.                       After obtaining informed consent, the endoscope was                        passed under direct vision. Throughout the procedure,                        the patient's blood pressure, pulse, and oxygen                        saturations were monitored continuously. The Endoscope                        was introduced through the mouth, and advanced to the  third part of duodenum. The upper GI endoscopy was                        accomplished with ease. The patient tolerated the                        procedure well. Findings:      The examined duodenum was normal.      The stomach was  normal.      The cardia and gastric fundus were normal on retroflexion.      The examined esophagus was normal. Biopsies were obtained from the       proximal and distal esophagus with cold forceps for histology of       suspected eosinophilic esophagitis. Impression:           - Normal examined duodenum.                       - Normal stomach.                       - Normal esophagus. Biopsied. Recommendation:       - Discharge patient to home (with escort).                       - Resume previous diet.                       - Continue present medications.                       - Await pathology results.                       - Return to my office as previously scheduled. Procedure Code(s):    --- Professional ---                       [phone], Esophagogastroduodenoscopy, flexible, transoral;                        with biopsy, single or multiple Diagnosis Code(s):    --- Professional ---                       K21.9, Gastro-esophageal reflux disease without                        esophagitis CPT copyright 2019 American Medical Association. All rights reserved. The codes documented in this report are preliminary and upon coder review may  be revised to meet current compliance requirements. Jonathon Bellows, MD Jonathon Bellows MD, MD 09/23/2019 9:32:57 AM This report has been signed electronically. Number of Addenda: 0 Note Initiated On: 09/23/2019 9:15 AM Estimated Blood Loss: Estimated blood loss: none.      [facility]

## 2019-09-23 NOTE — Transfer of Care (Signed)
Immediate Anesthesia Transfer of Care Note  Patient: [name]  Procedure(s) Performed: ESOPHAGOGASTRODUODENOSCOPY (EGD) WITH PROPOFOL (N/A )  Patient Location: Endoscopy Unit  Anesthesia Type:General  Level of Consciousness: awake, alert  and oriented  Airway & Oxygen Therapy: Patient Spontanous Breathing and Patient connected to nasal cannula oxygen  Post-op Assessment: Report given to RN and Post -op Vital signs reviewed and stable  Post vital signs: Reviewed and stable  Last Vitals:  Vitals Value Taken Time  BP    Temp    Pulse    Resp    SpO2      Last Pain:  Vitals:   09/23/19 0903  TempSrc: Tympanic  PainSc: 2          Complications: No apparent anesthesia complications

## 2019-09-23 NOTE — Anesthesia Postprocedure Evaluation (Signed)
Anesthesia Post Note  Patient: [name]  Procedure(s) Performed: ESOPHAGOGASTRODUODENOSCOPY (EGD) WITH PROPOFOL (N/A )  Patient location during evaluation: Endoscopy Anesthesia Type: General Level of consciousness: awake and alert Pain management: pain level controlled Vital Signs Assessment: post-procedure vital signs reviewed and stable Respiratory status: spontaneous breathing, nonlabored ventilation, respiratory function stable and patient connected to nasal cannula oxygen Cardiovascular status: blood pressure returned to baseline and stable Postop Assessment: no apparent nausea or vomiting Anesthetic complications: no     Last Vitals:  Vitals:   09/23/19 0950 09/23/19 1000  BP: (!) 160/75 (!) 172/76  Pulse: 62 63  Resp: 18 19  Temp:    SpO2: 99% 100%    Last Pain:  Vitals:   09/23/19 1000  TempSrc:   PainSc: 0-No pain                 Martha Clan

## 2019-09-23 NOTE — Progress Notes (Signed)
Anesthesia MD aware of elevated BP. No new orders. MD okay with pt to D/C home with husband.

## 2019-09-23 NOTE — Anesthesia Post-op Follow-up Note (Signed)
Anesthesia QCDR form completed.        

## 2019-09-23 NOTE — H&P (Signed)
Jonathon Bellows, MD [address], [place], [place], [place], [address] [address], [place], [place], [place], [address] Phone: [phone]  Fax: [phone]  Primary Care Physician:  Marinda Elk, MD   Pre-Procedure History & Physical: HPI:  [name] is a 85 y.o. female is here for an endoscopy    Past Medical History:  Diagnosis Date  . Arthritis   . Cancer [facility]) 2013   Non-Hodgkin Lymphoma with chemo and rad tx  . Dysrhythmia   . Follicular lymphoma grade I ([place]) 08/06/2002  . GERD (gastroesophageal reflux disease)   . 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 Hypertension   . Meniere's disease    with bilateral hearing loss  . Mini stroke [facility])    summer 2014  . Osteoporosis     Past Surgical History:  Procedure Laterality Date  . APPENDECTOMY    . BONE MARROW BIOPSY  08/09/2002  . BREAST LUMPECTOMY  1950s   benign  . CATARACT EXTRACTION  03/2009  . COLONOSCOPY    . LYMPH NODE BIOPSY  08/07/2002   cervical  . TUBAL LIGATION      Prior to Admission medications   Medication Sig Start Date End Date Taking? Authorizing Provider  Acetaminophen (TYLENOL ARTHRITIS EXT RELIEF PO) Take 1 tablet by mouth daily as needed (pain).   Yes [provider]  allopurinol (ZYLOPRIM) 300 MG tablet TAKE ONE TABLET BY MOUTH ONE TIME DAILY  01/18/19  Yes Corcoran, Drue Second, MD  AZELASTINE & FLUTICASONE NA 1 spray each nase twice daily.   Yes [provider]  calcium-vitamin D (CALCIUM 500/D) 500-200 MG-UNIT tablet Take 1 tablet by mouth 2 (two) times daily.    Yes [provider]  Flax Oil-Fish Oil-Borage Oil (FISH OIL-FLAX OIL-BORAGE OIL) CAPS Take 1,000-1,400 mg by mouth daily.   Yes [provider]  Multiple Vitamin (MULTI-VITAMINS) TABS Take 1 tablet by mouth daily.    Yes [provider]  Multiple Vitamins-Minerals (PRESERVISION AREDS 2 PO) Take 1 tablet by mouth daily.    Yes [provider]  omeprazole (PRILOSEC) 40 MG capsule Take 40 mg by mouth daily.   Yes [provider]  polyethylene glycol (MIRALAX / GLYCOLAX) 17 g packet Take 17 g by mouth daily.   Yes [provider]  Probiotic Product (PROBIOTIC DAILY PO) Take 2 tablets by mouth daily.    Yes [provider]  sucralfate (CARAFATE) 1 g tablet Take 1 tablet (1 g total) by mouth 3 (three) times daily. Dissolve in warm water, take 30 minutes meals 03/02/19  Yes Chrystal, Eulas Post, MD  triamcinolone cream (KENALOG) 0.1 % Apply 1 application topically 2 (two) times daily.    Yes [provider]  Turmeric Curcumin 500 MG CAPS Take 1 capsule by mouth daily.    Yes [provider]  denosumab (PROLIA) 60 MG/ML SOSY injection Inject 60 mg into the skin every 6 (six) months.     [provider]  primidone (MYSOLINE) 50 MG tablet Take by mouth. 08/23/19 08/22/20  [provider]    Allergies as of 09/12/2019 - Review Complete 09/12/2019  Allergen Reaction Noted  . Lisinopril Cough 09/26/2015    Family History  Problem Relation Age of Onset  . Cancer Father        Throat  . Cancer Sister 16       Colon    Social History   Socioeconomic History  .  Marital status: Married    Spouse name: Not on file  . Number of children: Not on file  . Years of education: Not on file  . Highest education level: Not on file  Occupational History  . Not on file  Social Needs  . Financial resource strain: Not on file  . Food insecurity    Worry: Not on file    Inability: Not on file  . Transportation needs    Medical: Not on file    Non-medical: Not on file  Tobacco Use  . Smoking status: Never Smoker  . Smokeless tobacco: Never Used  Substance and Sexual Activity  . Alcohol use: No    Alcohol/week: 0.0 standard drinks  . Drug use: No  . Sexual activity: Not on file  Lifestyle  . Physical activity    Days per week: Not on file    Minutes per session: Not on  file  . Stress: Not on file  Relationships  . Social Herbalist on phone: Not on file    Gets together: Not on file    Attends religious service: Not on file    Active member of club or organization: Not on file    Attends meetings of clubs or organizations: Not on file    Relationship status: Not on file  . Intimate partner violence    Fear of current or ex partner: Not on file    Emotionally abused: Not on file    Physically abused: Not on file    Forced sexual activity: Not on file  Other Topics Concern  . Not on file  Social History Narrative  . Not on file    Review of Systems: See HPI, otherwise negative ROS  Physical Exam: BP (!) 159/87   Pulse 64   Temp (!) 96.7 F (35.9 C) (Tympanic)   Resp 16   Ht 5' 5"  (1.651 m)   Wt 49 kg   SpO2 100%   BMI 17.97 kg/m  General:   Alert,  pleasant and cooperative in NAD Head:  Normocephalic and atraumatic. Neck:  Supple; no masses or thyromegaly. Lungs:  Clear throughout to auscultation, normal respiratory effort.    Heart:  +S1, +S2, Regular rate and rhythm, No edema. Abdomen:  Soft, nontender and nondistended. Normal bowel sounds, without guarding, and without rebound.   Neurologic:  Alert and  oriented x4;  grossly normal neurologically.  Impression/Plan: [name] is here for an endoscopy  to be performed for  evaluation of GERD    Risks, benefits, limitations, and alternatives regarding endoscopy have been reviewed with the patient.  Questions have been answered.  All parties agreeable.   Jonathon Bellows, MD  09/23/2019, 9:14 AM

## 2019-09-26 ENCOUNTER — Other Ambulatory Visit: Payer: Self-pay

## 2019-09-26 ENCOUNTER — Encounter: Payer: Self-pay | Admitting: Gastroenterology

## 2019-09-26 ENCOUNTER — Telehealth: Payer: Self-pay

## 2019-09-26 ENCOUNTER — Telehealth: Payer: Self-pay | Admitting: Gastroenterology

## 2019-09-26 LAB — SURGICAL PATHOLOGY

## 2019-09-26 MED ORDER — DEXLANSOPRAZOLE 60 MG PO CPDR: 60.0000 mg | DELAYED_RELEASE_CAPSULE | Freq: Every day | ORAL | 1 refills | Status: DC

## 2019-09-26 NOTE — Telephone Encounter (Signed)
Pt left vm to get a prescription for Dexilant send to [facility] in [place]

## 2019-09-26 NOTE — Telephone Encounter (Signed)
Please  Call pt once rx is  Called in

## 2019-09-26 NOTE — Telephone Encounter (Signed)
Informed patient, per Dr. Mike Gip, scans looked great and patient can hold allopurinol at this time. Advised Dr. Mike Gip would like patient to recheck labs in 2 weeks. Patient verbalizes understanding and denies any further questions or concerns.

## 2019-09-26 NOTE — Telephone Encounter (Signed)
Patient called in & l/m stating she needs Dexilant called in to [facility] since it's a new script from her colonoscopy done Friday 09-23-19 by Dr Vicente Males. Please call patient @ [phone].

## 2019-09-26 NOTE — Telephone Encounter (Signed)
Called pt to inform her that we have sent the [place] prescription to her preferred pharmacy.  Unable to contact, left detailed VM

## 2019-09-29 ENCOUNTER — Encounter: Payer: Self-pay | Admitting: Gastroenterology

## 2019-09-30 MED ORDER — ALLOPURINOL 300 MG PO TABS: 300.0000 mg | ORAL_TABLET | Freq: Every day | ORAL | 0 refills | Status: DC

## 2019-10-04 ENCOUNTER — Encounter: Payer: Self-pay | Admitting: Gastroenterology

## 2019-10-04 ENCOUNTER — Ambulatory Visit (INDEPENDENT_AMBULATORY_CARE_PROVIDER_SITE_OTHER): Payer: Medicare Other | Admitting: Gastroenterology

## 2019-10-04 ENCOUNTER — Other Ambulatory Visit: Payer: Self-pay

## 2019-10-04 VITALS — BP 162/79 | HR 64 | Temp 98.1°F | Ht 65.0 in | Wt 112.8 lb

## 2019-10-04 DIAGNOSIS — K219 Gastro-esophageal reflux disease without esophagitis: Secondary | ICD-10-CM | POA: Diagnosis not present

## 2019-10-04 NOTE — Progress Notes (Signed)
Jonathon Bellows MD, MRCP(U.K) [address]  [place]  [place], [place] [address]  Main: [phone]  Fax: [phone]   Primary Care Physician: Marinda Elk, MD  Primary Gastroenterologist:  Dr. Jonathon Bellows   Follow-up after her procedures   HPI: [name] is a 85 y.o. female    Summary of history :    She was initially seen and referred on 09/12/2019 for GERD and esophagitis.  She has a history of non-Hodgkin's lymphoma status post radiation.  She has had issues with acid reflux since 2003 which was initially treated with Zantac and then changed to Prilosec which stopped working subsequently.  At her initial visit she was taking Gaviscon, Pepcid, Prilosec and still having symptoms.  Her symptoms were tightness when she swallows solids or liquids but did not affect water.  CT scan of the abdomen on 09/12/2019 showed interval decrease in the size of retroperitoneal mass about the right aspect of the inferior vena cava posterior to the duodenum and pancreatic head.  Mass measures 4.6 x 2.8 cm.  Large burden of stool seen in the colon.  CT scan of the abdomen on 03/11/2019 also shows a large colonic stool volume suggesting constipation.    Interval history   09/12/2019-10/04/2019  09/23/2019: EGD normal: Biopsies of the esophagus showed no significant pathology.  She felt that since commencing on the Dexilant 60 mg a day she has had no symptoms.  Prior to obtaining the [place] she says she went back on omeprazole and the symptoms were present on and off.  She is very concerned over the price of Dexilant which is over $500 for a 90-day supply.  Current Outpatient Medications  Medication Sig Dispense Refill   Acetaminophen (TYLENOL ARTHRITIS EXT RELIEF PO) Take 1 tablet by mouth daily as needed (pain).     allopurinol (ZYLOPRIM) 300 MG tablet Take 1 tablet (300 mg total) by mouth daily. 90 tablet 0   AZELASTINE & FLUTICASONE NA 1 spray each nase twice daily.      calcium-vitamin D (CALCIUM 500/D) 500-200 MG-UNIT tablet Take 1 tablet by mouth 2 (two) times daily.      denosumab (PROLIA) 60 MG/ML SOSY injection Inject 60 mg into the skin every 6 (six) months.      dexlansoprazole (DEXILANT) 60 MG capsule Take 1 capsule (60 mg total) by mouth daily. 90 capsule 1   Flax Oil-Fish Oil-Borage Oil (FISH OIL-FLAX OIL-BORAGE OIL) CAPS Take 1,000-1,400 mg by mouth daily.     Multiple Vitamin (MULTI-VITAMINS) TABS Take 1 tablet by mouth daily.      Multiple Vitamins-Minerals (PRESERVISION AREDS 2 PO) Take 1 tablet by mouth daily.      omeprazole (PRILOSEC) 40 MG capsule Take 40 mg by mouth daily.     polyethylene glycol (MIRALAX / GLYCOLAX) 17 g packet Take 17 g by mouth daily.     primidone (MYSOLINE) 50 MG tablet Take by mouth.     Probiotic Product (PROBIOTIC DAILY PO) Take 2 tablets by mouth daily.      sucralfate (CARAFATE) 1 g tablet Take 1 tablet (1 g total) by mouth 3 (three) times daily. Dissolve in warm water, take 30 minutes meals 90 tablet 6   triamcinolone cream (KENALOG) 0.1 % Apply 1 application topically 2 (two) times daily.      Turmeric Curcumin 500 MG CAPS Take 1 capsule by mouth daily.      No current facility-administered medications for this visit.     Allergies  as of 10/04/2019 - Review Complete 09/23/2019  Allergen Reaction Noted   Lisinopril Cough 09/26/2015    ROS:  General: Negative for anorexia, weight loss, fever, chills, fatigue, weakness. ENT: Negative for hoarseness, difficulty swallowing , nasal congestion. CV: Negative for chest pain, angina, palpitations, dyspnea on exertion, peripheral edema.  Respiratory: Negative for dyspnea at rest, dyspnea on exertion, cough, sputum, wheezing.  GI: See history of present illness. GU:  Negative for dysuria, hematuria, urinary incontinence, urinary frequency, nocturnal urination.  Endo: Negative for unusual weight change.    Physical Examination:   There were no  vitals taken for this visit.  General: Well-nourished, well-developed in no acute distress.  Eyes: No icterus. Conjunctivae pink. Mouth: Oropharyngeal mucosa moist and pink , no lesions erythema or exudate. Lungs: Clear to auscultation bilaterally. Non-labored. Heart: Regular rate and rhythm, no murmurs rubs or gallops.  Abdomen: Bowel sounds are normal, nontender, nondistended, no hepatosplenomegaly or masses, no abdominal bruits or hernia , no rebound or guarding.   Extremities: No lower extremity edema. No clubbing or deformities. Neuro: Alert and oriented x 3.  Grossly intact. Skin: Warm and dry, no jaundice.   Psych: Alert and cooperative, normal mood and affect.   Imaging Studies: Ct Abdomen Pelvis W Contrast  Result Date: 09/12/2019 CLINICAL DATA:  Follow-up follicular lymphoma EXAM: CT ABDOMEN AND PELVIS WITH CONTRAST TECHNIQUE: Multidetector CT imaging of the abdomen and pelvis was performed using the standard protocol following bolus administration of intravenous contrast. CONTRAST:  174mL OMNIPAQUE IOHEXOL 300 MG/ML SOLN, additional oral enteric contrast COMPARISON:  03/11/2019, PET-CT, 11/08/2018 FINDINGS: Lower chest: No acute abnormality. Hepatobiliary: No solid liver abnormality is seen. No gallstones, gallbladder wall thickening, or obstructing lesion identified. There is mild intrahepatic biliary ductal dilatation without dilatation of the common bile duct or other abnormality to the ampulla, this appearance similar to prior examination. Pancreas: Unremarkable. No pancreatic ductal dilatation or surrounding inflammatory changes. Spleen: Normal in size without significant abnormality. Adrenals/Urinary Tract: Adrenal glands are unremarkable. Kidneys are normal, without renal calculi, solid lesion, or hydronephrosis. Bladder is unremarkable. Stomach/Bowel: Stomach is within normal limits. Appendix is not clearly visualized. No evidence of bowel wall thickening, distention, or  inflammatory changes. Large burden of stool and stool balls in the colon and rectum. Vascular/Lymphatic: Aortic atherosclerosis. 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 There has been significant interval decrease in size of a retroperitoneal mass about the right aspect of the inferior vena cava, posterior to the duodenum and pancreatic head and anterior to the right renal pelvis. This mass measures 4.6 x 2.8 cm, previously 6.5 x 4.1 cm when measured similarly (series 2, image 22). There is redemonstrated, adjacent post treatment retroperitoneal soft tissue about the inferior vena cava and aorta, which is similar in appearance (series 2, image 28). There are no discretely enlarged lymph nodes in the abdomen or pelvis. Reproductive: No mass or other significant abnormality. Other: No abdominal wall hernia or abnormality. No abdominopelvic ascites. Musculoskeletal: Levoscoliosis of the lumbar spine. IMPRESSION: 1. There has been significant interval decrease in size of a retroperitoneal mass about the right aspect of the inferior vena cava, posterior to the duodenum and pancreatic head and anterior to the right renal pelvis. This mass measures 4.6 x 2.8 cm, previously 6.5 x 4.1 cm when measured similarly (series 2, image 22). There is redemonstrated, adjacent post treatment retroperitoneal soft tissue about the inferior vena cava and aorta, which is similar in  appearance (series 2, image 28). Findings are consistent with ongoing treatment response. 2. There are no discretely enlarged lymph nodes in the abdomen or pelvis. 3.  Large burden of stool in the colon. 4.  Aortic Atherosclerosis (ICD10-I70.0). Electronically Signed   By: Eddie Candle M.D.   On: 09/12/2019 10:03    Assessment and Plan:   TACHE CUTRONE is a 85 y.o. y/o female here to follow-up for acid reflux.  Symptoms of discomfort associated with swallowing solids.  EGD with  biopsies were normal.  She has not responded to Prilosec and Pepcid in the past but has completely responded to Dexilant 60 mg a day.  Main issue is a cough related to the medication.  The fact that she responded to [place] indicates that she does have acid reflux.   Plan 1.  Explained that due to the long-term effects of PPI use and is risks associated my aim is to put her on the lowest dose needed for the shortest period of time.  I do understand that the cost of Dexilant is significantly higher and hence we will try to obtain patient assistance for the patient by contacting the company.  In addition I suggested that if we cannot get patient assistance we should try going back to Nexium at the end of 90 days when she runs out of her medication to see if we can get some benefit from its use.  If Nexium does not work we could try other drug such as rabeprazole.  Obviously she was in much more discomfort when the medication was not working and is doing significantly better now that her symptoms are controlled, hence it would warrant long-term use of PPI.  I explained to her it is always a balance between the risks of taking the medication versus the benefits.  In her case particularly if affecting the quality of life when she is symptomatic  Dr Jonathon Bellows  MD,MRCP [facility]) Follow up in 4 months telephone visit

## 2019-10-12 ENCOUNTER — Other Ambulatory Visit: Payer: Self-pay

## 2019-10-12 ENCOUNTER — Inpatient Hospital Stay: Payer: Medicare Other | Attending: Hematology and Oncology

## 2019-10-12 DIAGNOSIS — C8200 Follicular lymphoma grade I, unspecified site: Secondary | ICD-10-CM

## 2019-10-12 DIAGNOSIS — Z9221 Personal history of antineoplastic chemotherapy: Secondary | ICD-10-CM | POA: Diagnosis not present

## 2019-10-12 DIAGNOSIS — Z923 Personal history of irradiation: Secondary | ICD-10-CM | POA: Insufficient documentation

## 2019-10-12 LAB — CBC WITH DIFFERENTIAL/PLATELET
Abs Immature Granulocytes: 0.05 10*3/uL (ref 0.00–0.07)
Basophils Absolute: 0.1 10*3/uL (ref 0.0–0.1)
Basophils Relative: 1 %
Eosinophils Absolute: 0.1 10*3/uL (ref 0.0–0.5)
Eosinophils Relative: 1 %
HCT: 32 % — ABNORMAL LOW (ref 36.0–46.0)
Hemoglobin: 11 g/dL — ABNORMAL LOW (ref 12.0–15.0)
Immature Granulocytes: 1 %
Lymphocytes Relative: 5 %
Lymphs Abs: 0.3 10*3/uL — ABNORMAL LOW (ref 0.7–4.0)
MCH: 33.7 pg (ref 26.0–34.0)
MCHC: 34.4 g/dL (ref 30.0–36.0)
MCV: 98.2 fL (ref 80.0–100.0)
Monocytes Absolute: 0.8 10*3/uL (ref 0.1–1.0)
Monocytes Relative: 12 %
Neutro Abs: 5.8 10*3/uL (ref 1.7–7.7)
Neutrophils Relative %: 80 %
Platelets: 224 10*3/uL (ref 150–400)
RBC: 3.26 MIL/uL — ABNORMAL LOW (ref 3.87–5.11)
RDW: 13.6 % (ref 11.5–15.5)
WBC: 7.1 10*3/uL (ref 4.0–10.5)
nRBC: 0 % (ref 0.0–0.2)

## 2019-10-12 LAB — COMPREHENSIVE METABOLIC PANEL
ALT: 26 U/L (ref 0–44)
AST: 25 U/L (ref 15–41)
Albumin: 4.1 g/dL (ref 3.5–5.0)
Alkaline Phosphatase: 64 U/L (ref 38–126)
Anion gap: 8 (ref 5–15)
BUN: 28 mg/dL — ABNORMAL HIGH (ref 8–23)
CO2: 27 mmol/L (ref 22–32)
Calcium: 9 mg/dL (ref 8.9–10.3)
Chloride: 97 mmol/L — ABNORMAL LOW (ref 98–111)
Creatinine, Ser: 0.8 mg/dL (ref 0.44–1.00)
GFR calc Af Amer: >60 mL/min (ref >60)
GFR calc non Af Amer: >60 mL/min (ref >60)
Glucose, Bld: 89 mg/dL (ref 70–99)
Potassium: 4.3 mmol/L (ref 3.5–5.1)
Sodium: 132 mmol/L — ABNORMAL LOW (ref 135–145)
Total Bilirubin: 0.5 mg/dL (ref 0.3–1.2)
Total Protein: 6 g/dL — ABNORMAL LOW (ref 6.5–8.1)

## 2019-10-12 LAB — LACTATE DEHYDROGENASE: LDH: 130 U/L (ref 98–192)

## 2019-10-12 LAB — URIC ACID: Uric Acid, Serum: 4.3 mg/dL (ref 2.5–7.1)

## 2019-10-24 ENCOUNTER — Other Ambulatory Visit: Payer: Self-pay

## 2019-10-24 ENCOUNTER — Ambulatory Visit (INDEPENDENT_AMBULATORY_CARE_PROVIDER_SITE_OTHER): Payer: Medicare Other | Admitting: Vascular Surgery

## 2019-10-24 ENCOUNTER — Encounter (INDEPENDENT_AMBULATORY_CARE_PROVIDER_SITE_OTHER): Payer: Self-pay | Admitting: Vascular Surgery

## 2019-10-24 ENCOUNTER — Other Ambulatory Visit (INDEPENDENT_AMBULATORY_CARE_PROVIDER_SITE_OTHER): Payer: Self-pay | Admitting: Vascular Surgery

## 2019-10-24 ENCOUNTER — Ambulatory Visit (INDEPENDENT_AMBULATORY_CARE_PROVIDER_SITE_OTHER): Payer: Medicare Other

## 2019-10-24 VITALS — BP 154/75 | HR 60 | Resp 16 | Wt 111.6 lb

## 2019-10-24 DIAGNOSIS — I722 Aneurysm of renal artery: Secondary | ICD-10-CM

## 2019-10-24 DIAGNOSIS — E7800 Pure hypercholesterolemia, unspecified: Secondary | ICD-10-CM | POA: Diagnosis not present

## 2019-10-24 DIAGNOSIS — C8200 Follicular lymphoma grade I, unspecified site: Secondary | ICD-10-CM | POA: Diagnosis not present

## 2019-10-24 DIAGNOSIS — K219 Gastro-esophageal reflux disease without esophagitis: Secondary | ICD-10-CM

## 2019-10-24 NOTE — Progress Notes (Signed)
MRN : [id number]  [name] is a 85 y.o. (08-08-1932) female who presents with chief complaint of No chief complaint on file. 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 Patient denies abdominal pain or back pain, no other abdominal complaints. No changes suggesting embolic episodes.  BP has been stable.  The patient is also seen for followup evaluation regarding leg swelling.  The swelling has improved quite a bit and the pain associated with swelling has decreased substantially. There have not been any interval development of a ulcerations or wounds.  Since the previous visit the patient has been wearing graduated compression stockings and has noted little significant improvement in the lymphedema. The patient has been using compression routinely morning until night.  The patient also states elevation during the day and exercise is being done too.  There have been no interval changes in the patient's overall health care since his last visit.  Patient denies amaurosis fugax or TIA symptoms. There is no history of claudication or rest pain symptoms of the lower extremities. The patient denies angina or shortness of breath.   Duplex US of the aorta and renal arteries shows a normal right kidney small aneurysm previously noted is not identified at this study either.  No outpatient medications have been marked as taking for the 10/24/19 encounter (Appointment) with Delana Meyer, Dolores Lory, MD.    Past Medical History:  Diagnosis Date  . Arthritis   . Cancer [facility]) 2013   Non-Hodgkin Lymphoma with chemo and rad tx  . Dysrhythmia   . Follicular lymphoma grade I ([place]) 08/06/2002  . GERD (gastroesophageal reflux disease)   . 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 Hypertension   . Meniere's disease    with bilateral hearing loss  . Mini stroke [facility])    summer 2014  .  Osteoporosis     Past Surgical History:  Procedure Laterality Date  . APPENDECTOMY    . BONE MARROW BIOPSY  08/09/2002  . BREAST LUMPECTOMY  1950s   benign  . CATARACT EXTRACTION  03/2009  . COLONOSCOPY    . ESOPHAGOGASTRODUODENOSCOPY (EGD) WITH PROPOFOL N/A 09/23/2019   Procedure: ESOPHAGOGASTRODUODENOSCOPY (EGD) WITH PROPOFOL;  Surgeon: Jonathon Bellows, MD;  Location[facility] ENDOSCOPY;  Service: Gastroenterology;  Laterality: N/A;  . LYMPH NODE BIOPSY  08/07/2002   cervical  . TUBAL LIGATION      Social History Social History   Tobacco Use  . Smoking status: Never Smoker  . Smokeless tobacco: Never Used  Substance Use Topics  . Alcohol use: No    Alcohol/week: 0.0 standard drinks  . Drug use: No    Family History Family History  Problem Relation Age of Onset  . Cancer Father        Throat  . Cancer Sister 6       Colon    Allergies  Allergen Reactions  . Lisinopril Cough     REVIEW OF SYSTEMS (Negative unless checked)  Constitutional: _0 Weight loss  _1 Fever  _2 Chills Cardiac: _3 Chest pain   _4 Chest pressure   _5 Palpitations   _6 Shortness of breath when laying flat   _7 Shortness of breath with exertion. Vascular:  _8 Pain in legs with walking   _9 Pain in legs at rest  _10 History of DVT   _11 Phlebitis   _12 Swelling in legs   _13 Varicose veins   _14 Non-healing ulcers Pulmonary:   _15 Uses home oxygen   _16   Productive cough   _0 Hemoptysis   _1 Wheeze  _2 COPD   _3 Asthma Neurologic:  _4 Dizziness   _5 Seizures   _6 History of stroke   _7 History of TIA  _8 Aphasia   _9 Vissual changes   _10 Weakness or numbness in arm   _11 Weakness or numbness in leg Musculoskeletal:   _12 Joint swelling   _13 Joint pain   _14 Low back pain Hematologic:  _15 Easy bruising  _16 Easy bleeding   _17 Hypercoagulable state   _18 Anemic Gastrointestinal:  _19 Diarrhea   _20 Vomiting  _21 Gastroesophageal reflux/heartburn   _22 Difficulty swallowing. Genitourinary:  _23 Chronic kidney disease   _24 Difficult urination  _25 Frequent  urination   _26 Blood in urine Skin:  _27 Rashes   _28 Ulcers  Psychological:  _29 History of anxiety   _30  History of major depression.  Physical Examination  There were no vitals filed for this visit. There is no height or weight on file to calculate BMI. Gen: WD/WN, NAD Head: Green/AT, No temporalis wasting.  Ear/Nose/Throat: Hearing grossly intact, nares w/o erythema or drainage Eyes: PER, EOMI, sclera nonicteric.  Neck: Supple, no large masses.   Pulmonary:  Good air movement, no audible wheezing bilaterally, no use of accessory muscles.  Cardiac: RRR, no JVD Vascular: no abdominal bruits; no carotid bruits Vessel Right Left  Radial Palpable Palpable  Gastrointestinal: Non-distended. No guarding/no peritoneal signs.  Musculoskeletal: M/S 5/5 throughout.  No deformity or atrophy.  Neurologic: CN 2-12 intact. Symmetrical.  Speech is fluent. Motor exam as listed above. Psychiatric: Judgment intact, Mood & affect appropriate for pt's clinical situation. Dermatologic: No rashes or ulcers noted.  No changes consistent with cellulitis. Lymph : No lichenification or skin changes of chronic lymphedema.  CBC Lab Results  Component Value Date   WBC 7.1 10/12/2019   HGB 11.0 (L) 10/12/2019   HCT 32.0 (L) 10/12/2019   MCV 98.2 10/12/2019   PLT 224 10/12/2019    BMET    Component Value Date/Time   NA 132 (L) 10/12/2019 1323   NA 125 (L) 11/26/2013 0954   K 4.3 10/12/2019 1323   K 3.2 (L) 11/26/2013 0954   CL 97 (L) 10/12/2019 1323   CL 91 (L) 11/26/2013 0954   CO2 27 10/12/2019 1323   CO2 29 11/26/2013 0954   GLUCOSE 89 10/12/2019 1323   GLUCOSE 96 11/26/2013 0954   BUN 28 (H) 10/12/2019 1323   BUN 10 11/26/2013 0954   CREATININE 0.80 10/12/2019 1323   CREATININE 0.70 11/26/2013 0954   CALCIUM 9.0 10/12/2019 1323   CALCIUM 8.5 11/26/2013 0954   GFRNONAA >60 10/12/2019 1323   GFRNONAA >60 11/26/2013 0954   GFRAA >60 10/12/2019 1323   GFRAA >60 11/26/2013 0954   CrCl cannot be  calculated (Unknown ideal weight.).  COAG Lab Results  Component Value Date   INR 0.90 11/23/2018   INR 0.91 11/20/2017    Radiology No results found.    Assessment/Plan 1. Aneurysm of renal artery (HCC) Given patient's renal artery aneurysm, optimal control of the patient's hypertension is important.  It remains small and difficult to detect and therefore no intervention is needed.  BP is acceptable today  The patient's vital signs and noninvasive studies support the renal artery stenosis is not significantly increased when compared to the previous study.  No invasive studies or intervention is indicated at this time.  The patient will continue the current antihypertensive medications, no changes at this time.  The primary medical service will continue aggressive antihypertensive therapy as per the AHA guidelines   - Renal; Future  2. Follicular lymphoma grade  I, unspecified body region [facility]) The para renal mass is again noted and is smaller than last year.  3. Hypercholesterolemia Continue statin as ordered and reviewed, no changes at this time   4. Gastroesophageal reflux disease without esophagitis Continue PPI as already ordered, this medication has been reviewed and there are no changes at this time.  Avoidence of caffeine and alcohol  Moderate elevation of the head of the bed   Hortencia Pilar, MD  10/24/2019 9:02 AM

## 2019-11-07 ENCOUNTER — Ambulatory Visit
Admission: RE | Admit: 2019-11-07 | Discharge: 2019-11-07 | Disposition: A | Discharge status: Home / Self Care | Payer: Medicare Other | Source: Ambulatory Visit | Attending: Hematology and Oncology | Admitting: Hematology and Oncology

## 2019-11-07 DIAGNOSIS — M810 Age-related osteoporosis without current pathological fracture: Secondary | ICD-10-CM | POA: Diagnosis present

## 2019-11-30 ENCOUNTER — Telehealth: Payer: Self-pay

## 2019-11-30 NOTE — Telephone Encounter (Signed)
Called pt to enquire about the patient assistance program for the [facility]. Pt states due to her medicare insurance plan she did not qualify. I reminded pt of Dr. Georgeann Oppenheim recommendation to try Nexium again once she completes current Dexilant prescription. Pt agrees and plans to contact our office to request the Nexium prescription once she's almost completed the Dexilant.

## 2019-12-19 ENCOUNTER — Inpatient Hospital Stay: Payer: Medicare Other

## 2019-12-19 ENCOUNTER — Other Ambulatory Visit: Payer: Self-pay

## 2019-12-19 ENCOUNTER — Inpatient Hospital Stay: Payer: Medicare Other | Attending: Hematology and Oncology

## 2019-12-19 ENCOUNTER — Telehealth: Payer: Self-pay

## 2019-12-19 VITALS — BP 148/61 | HR 66 | Temp 97.4°F | Resp 18

## 2019-12-19 DIAGNOSIS — M810 Age-related osteoporosis without current pathological fracture: Secondary | ICD-10-CM | POA: Diagnosis not present

## 2019-12-19 DIAGNOSIS — C8200 Follicular lymphoma grade I, unspecified site: Secondary | ICD-10-CM

## 2019-12-19 DIAGNOSIS — Z9221 Personal history of antineoplastic chemotherapy: Secondary | ICD-10-CM | POA: Insufficient documentation

## 2019-12-19 LAB — CBC WITH DIFFERENTIAL/PLATELET
Abs Immature Granulocytes: 0.04 10*3/uL (ref 0.00–0.07)
Basophils Absolute: 0.1 10*3/uL (ref 0.0–0.1)
Basophils Relative: 1 %
Eosinophils Absolute: 0.6 10*3/uL — ABNORMAL HIGH (ref 0.0–0.5)
Eosinophils Relative: 8 %
HCT: 34.5 % — ABNORMAL LOW (ref 36.0–46.0)
Hemoglobin: 11.7 g/dL — ABNORMAL LOW (ref 12.0–15.0)
Immature Granulocytes: 1 %
Lymphocytes Relative: 3 %
Lymphs Abs: 0.2 10*3/uL — ABNORMAL LOW (ref 0.7–4.0)
MCH: 31.8 pg (ref 26.0–34.0)
MCHC: 33.9 g/dL (ref 30.0–36.0)
MCV: 93.8 fL (ref 80.0–100.0)
Monocytes Absolute: 1.2 10*3/uL — ABNORMAL HIGH (ref 0.1–1.0)
Monocytes Relative: 16 %
Neutro Abs: 5.1 10*3/uL (ref 1.7–7.7)
Neutrophils Relative %: 71 %
Platelets: 321 10*3/uL (ref 150–400)
RBC: 3.68 MIL/uL — ABNORMAL LOW (ref 3.87–5.11)
RDW: 11.9 % (ref 11.5–15.5)
WBC: 7.2 10*3/uL (ref 4.0–10.5)
nRBC: 0 % (ref 0.0–0.2)

## 2019-12-19 LAB — COMPREHENSIVE METABOLIC PANEL
ALT: 31 U/L (ref 0–44)
AST: 23 U/L (ref 15–41)
Albumin: 3.9 g/dL (ref 3.5–5.0)
Alkaline Phosphatase: 131 U/L — ABNORMAL HIGH (ref 38–126)
Anion gap: 9 (ref 5–15)
BUN: 15 mg/dL (ref 8–23)
CO2: 25 mmol/L (ref 22–32)
Calcium: 9.2 mg/dL (ref 8.9–10.3)
Chloride: 95 mmol/L — ABNORMAL LOW (ref 98–111)
Creatinine, Ser: 0.7 mg/dL (ref 0.44–1.00)
GFR calc Af Amer: >60 mL/min (ref >60)
GFR calc non Af Amer: >60 mL/min (ref >60)
Glucose, Bld: 91 mg/dL (ref 70–99)
Potassium: 4.4 mmol/L (ref 3.5–5.1)
Sodium: 129 mmol/L — ABNORMAL LOW (ref 135–145)
Total Bilirubin: 0.6 mg/dL (ref 0.3–1.2)
Total Protein: 6.5 g/dL (ref 6.5–8.1)

## 2019-12-19 LAB — LACTATE DEHYDROGENASE: LDH: 129 U/L (ref 98–192)

## 2019-12-19 LAB — URIC ACID: Uric Acid, Serum: 3.3 mg/dL (ref 2.5–7.1)

## 2019-12-19 MED ORDER — DENOSUMAB 60 MG/ML ~~LOC~~ SOSY
60.0000 mg | PREFILLED_SYRINGE | Freq: Once | SUBCUTANEOUS | Status: AC
  Administered 2019-12-19: 60 mg via SUBCUTANEOUS

## 2019-12-19 NOTE — Telephone Encounter (Signed)
-----   Message from Lequita Asal, MD sent at 12/19/2019  1:59 PM EST ----- Regarding: Please send labs to her PCP.  Sodium was low 2 months ago and is lower.  Is she on a diuretic?    Drinking fluids with electrolytes rather than palin water.  This needs to be evaluated.  M ----- Message ----- From: Interface, Lab In [place] Sent: 12/19/2019   1:24 PM EST To: Lequita Asal, MD

## 2019-12-19 NOTE — Telephone Encounter (Signed)
Spoke with the patient to inform her that her sodium is low and can she try to drink more fluids with electroclytes rather than plain water. The patient labs has also been routed to her PCP office. The patient was understanding and agreeable.

## 2019-12-21 ENCOUNTER — Telehealth: Payer: Self-pay | Admitting: Gastroenterology

## 2019-12-21 NOTE — Telephone Encounter (Signed)
Pt left vm she states she received a couple messages to come see Dr. Vicente Males she states she has been doing fine and will try another  Medication before going back to [place].

## 2020-03-09 ENCOUNTER — Other Ambulatory Visit: Payer: Self-pay

## 2020-03-09 DIAGNOSIS — C8200 Follicular lymphoma grade I, unspecified site: Secondary | ICD-10-CM

## 2020-03-12 NOTE — Progress Notes (Signed)
[facility]  [address] [place], [place] [address] Phone: [phone]  Fax: [phone]   Clinic Day:  03/14/2020  Referring physician: Marinda Elk, MD  Chief Complaint: [name] is a 86 y.o. female with a history of stage IIIA follicular non-Hodgkin's lymphoma  who is seen for 6 month assessment.  HPI: The patient was last seen in the medical oncology clinic on 09/14/2019. At that time, she had had issues with reflux following radiation. She denied any B symptoms. Exam revealed no adenopathy or hepatosplenomegaly.  The patient met with Dr. Vicente Males on 09/23/2019 for an upper GI endoscopy. Exam was normal. Esophageal biopsy revealed no dysplasia or malignancy.    Labs followed:  10/12/2019: Hematocrit 32.0, hemoglobin 11.0, MCV 98.2, platelets 224,000, WBC 7100, ANC 5800. LDH 130. Uric acid 4.3.  12/19/2019: Hematocrit 34.5, hemoglobin 11.7, MCV 93.8, platelets 321,000, WBC 7200, ANC 5100.  LDH 129. Uric acid 3.3.   Sodium 129.  Bone density on 11/07/2019 revealed osteoporosis with a T score of -2.5 in the right femoral neck.  She received Prolia on 12/19/2019.  During the interim, she notes she has been experiencing abdominal pain after eating; symptoms have been ongoing for a month. Symptoms tend to be severe between 2 am and 4 am. She only eats twice a day, her last meal of the day is between 2 pm and 3 pm. She thought it was gas but it is more painful. She descries the pain as somebody squeezing something inside. She feel that there is a mound between her ribs and hip bone; she feels that her tumor has grown.  She tried Gas-X before sleeping to ease the pain; it did not help. She has been taking Dexilant; she denies acid reflux.   She has gained 6 lbs since 12/2019.  She is taking glucosamine for her knee pain. She has doubled her Calcium. She has been trying to drink more fluids; she has been drinking Gatorade everyday since her  last clinic visit to improve her sodium levels. Her right leg has been swollen for a week.    Past Medical History:  Diagnosis Date  . Arthritis   . Cancer [facility]) 2013   Non-Hodgkin Lymphoma with chemo and rad tx  . Dysrhythmia   . Follicular lymphoma grade I ([place]) 08/06/2002  . GERD (gastroesophageal reflux disease)   . 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 Hypertension   . Meniere's disease    with bilateral hearing loss  . Mini stroke [facility])    summer 2014  . Osteoporosis     Past Surgical History:  Procedure Laterality Date  . APPENDECTOMY    . BONE MARROW BIOPSY  08/09/2002  . BREAST LUMPECTOMY  1950s   benign  . CATARACT EXTRACTION  03/2009  . COLONOSCOPY    . ESOPHAGOGASTRODUODENOSCOPY (EGD) WITH PROPOFOL N/A 09/23/2019   Procedure: ESOPHAGOGASTRODUODENOSCOPY (EGD) WITH PROPOFOL;  Surgeon: Jonathon Bellows, MD;  Location: [facility] ENDOSCOPY;  Service: Gastroenterology;  Laterality: N/A;  . LYMPH NODE BIOPSY  08/07/2002   cervical  . TUBAL LIGATION      Family History  Problem Relation Age of Onset  . Cancer Father        Throat  . Cancer Sister 23       Colon    Social History:  reports that she has never smoked. She has never used smokeless tobacco. She reports that she does not drink alcohol or use  drugs. She lives in [place]. She and her husband are getting restless staying at home during the pandemic. The patient is alone  today.  Allergies:  Allergies  Allergen Reactions  . Lisinopril Cough    Current Medications: Current Outpatient Medications  Medication Sig Dispense Refill  . Acetaminophen (TYLENOL ARTHRITIS EXT RELIEF PO) Take 1 tablet by mouth daily as needed (pain).    . AZELASTINE & FLUTICASONE NA 1 spray each nase twice daily.    . calcium-vitamin D (CALCIUM 500/D) 500-200 MG-UNIT tablet Take 1 tablet by mouth 2 (two) times daily.     [name] denosumab (PROLIA) 60 MG/ML SOSY injection Inject 60 mg into the skin every 6 (six)  months.     . dexlansoprazole (DEXILANT) 60 MG capsule Take 1 capsule (60 mg total) by mouth daily. 90 capsule 1  . Flax Oil-Fish Oil-Borage Oil (FISH OIL-FLAX OIL-BORAGE OIL) CAPS Take 1,000-1,400 mg by mouth daily.    . Glucosamine HCl-MSM (GLUCOSAMINE-MSM PO) Take by mouth.    . Magnesium Citrate 125 MG CAPS     . melatonin 5 MG TABS Take 5 mg by mouth.    . Multiple Vitamin (MULTIVITAMIN) tablet Take 1 tablet by mouth daily.    . Multiple Vitamins-Minerals (PRESERVISION AREDS 2 PO) Take 1 tablet by mouth daily.     [name] omeprazole (PRILOSEC) 40 MG capsule Take 40 mg by mouth daily.    . polyethylene glycol (MIRALAX / GLYCOLAX) 17 g packet Take 17 g by mouth daily.    [name] triamcinolone cream (KENALOG) 0.1 % Apply 1 application topically 2 (two) times daily.     . Turmeric Curcumin 500 MG CAPS Take 1 capsule by mouth daily.     . primidone (MYSOLINE) 50 MG tablet Take by mouth.    . Probiotic Product (PROBIOTIC DAILY PO) Take 2 tablets by mouth daily.     . sucralfate (CARAFATE) 1 g tablet Take 1 tablet (1 g total) by mouth 3 (three) times daily. Dissolve in warm water, take 30 minutes meals (Patient not taking: Reported on 10/24/2019) 90 tablet 6  . vitamin B-12 (CYANOCOBALAMIN) 500 MCG tablet Take 500 mcg by mouth daily.     No current facility-administered medications for this visit.    Review of Systems  Constitutional: Negative for chills, diaphoresis, fever, malaise/fatigue and weight loss (gained 6 lbs since 12/2019).  HENT: Negative.  Negative for congestion, ear discharge, ear pain, nosebleeds, sinus pain and sore throat.   Eyes: Negative.  Negative for blurred vision, double vision, photophobia and pain.  Respiratory: Negative.  Negative for cough, hemoptysis, sputum production and shortness of breath.   Cardiovascular: Positive for leg swelling. Negative for chest pain, palpitations, orthopnea and PND.  Gastrointestinal: Positive for heartburn (on Dexilant). Negative for abdominal  pain, blood in stool, constipation (takes Miralax daily), diarrhea, melena, nausea and vomiting.       Abdominal pain x 1 month.  Genitourinary: Positive for frequency (5 times at night on 09/13/2019). Negative for dysuria, hematuria and urgency.  Musculoskeletal: Positive for joint pain (knee pain). Negative for back pain, falls, myalgias and neck pain.  Skin: Negative.  Negative for itching and rash.  Neurological: Positive for tremors (essential). Negative for dizziness, tingling, sensory change, speech change, focal weakness, weakness and headaches.  Endo/Heme/Allergies: Negative.  Does not bruise/bleed easily.  Psychiatric/Behavioral: Negative.  Negative for depression and memory loss. The patient is not nervous/anxious and does not have insomnia.   All other systems reviewed and are negative.  Performance status (ECOG):  1  Vitals Blood pressure (!) 148/79, pulse 83, temperature 97.6 F (36.4 C), temperature source Tympanic, resp. rate 19, weight 114 lb 10.2 oz (52 kg), SpO2 99 %.   Physical Exam  Constitutional: She is oriented to person, place, and time. She appears well-developed and well-nourished. No distress.  HENT:  Head: Normocephalic and atraumatic.  Mouth/Throat: Oropharynx is clear and moist. No oropharyngeal exudate.  Curly gray hair. Mask.  Eyes: Pupils are equal, round, and reactive to light. Conjunctivae and EOM are normal. No scleral icterus.  Gold rimmed glasses.  Blue eyes.   Neck: No JVD present.  Cardiovascular: Normal rate, regular rhythm and normal heart sounds.  No murmur heard. Pulmonary/Chest: Effort normal and breath sounds normal. No respiratory distress. She has no wheezes.  Abdominal: Soft. Bowel sounds are normal. She exhibits no distension and no mass. There is abdominal tenderness in the right upper quadrant and right lower quadrant. There is no rebound and no guarding.  Epigastric tenderness.  Musculoskeletal:        General: Edema present. Normal  range of motion.     Cervical back: Normal range of motion and neck supple.     Comments: Right lower extremity edema- slight above knee and +1 below knee.  Lymphadenopathy:       Head (right side): No preauricular, no posterior auricular and no occipital adenopathy present.       Head (left side): No preauricular, no posterior auricular and no occipital adenopathy present.    She has no cervical adenopathy.    She has no axillary adenopathy.       Right: No inguinal and no supraclavicular adenopathy present.       Left: No inguinal and no supraclavicular adenopathy present.  Neurological: She is alert and oriented to person, place, and time.  Skin: Skin is warm and dry. No rash noted. She is not diaphoretic. No erythema. No pallor.  Psychiatric: She has a normal mood and affect. Her behavior is normal. Judgment and thought content normal.  Nursing note and vitals reviewed.   Appointment on 03/14/2020  Component Date Value Ref Range Status  . LDH 03/14/2020 129  98 - 192 U/L Final   Performed at [facility], [address], [place], [place] [address]  . Uric Acid, Serum 03/14/2020 4.0  2.5 - 7.1 mg/dL Final   Performed at [facility], [address] [place], [place] [address]  . WBC 03/14/2020 6.6  4.0 - 10.5 K/uL Final  . RBC 03/14/2020 3.40* 3.87 - 5.11 MIL/uL Final  . Hemoglobin 03/14/2020 10.8* 12.0 - 15.0 g/dL Final  . HCT 03/14/2020 32.1* 36.0 - 46.0 % Final  . MCV 03/14/2020 94.4  80.0 - 100.0 fL Final  . MCH 03/14/2020 31.8  26.0 - 34.0 pg Final  . MCHC 03/14/2020 33.6  30.0 - 36.0 g/dL Final  . RDW 03/14/2020 13.3  11.5 - 15.5 % Final  . Platelets 03/14/2020 300  150 - 400 K/uL Final  . nRBC 03/14/2020 0.0  0.0 - 0.2 % Final  . Neutrophils Relative % 03/14/2020 81  % Final  . Neutro Abs 03/14/2020 5.4  1.7 - 7.7 K/uL Final  . Lymphocytes Relative 03/14/2020 4  % Final  . Lymphs Abs 03/14/2020 0.2* 0.7 - 4.0 K/uL Final  . Monocytes Relative  03/14/2020 12  % Final  . Monocytes Absolute 03/14/2020 0.8  0.1 - 1.0 K/uL Final  . Eosinophils Relative 03/14/2020 2  %  Final  . Eosinophils Absolute 03/14/2020 0.1  0.0 - 0.5 K/uL Final  . Basophils Relative 03/14/2020 1  % Final  . Basophils Absolute 03/14/2020 0.1  0.0 - 0.1 K/uL Final  . Immature Granulocytes 03/14/2020 0  % Final  . Abs Immature Granulocytes 03/14/2020 0.02  0.00 - 0.07 K/uL Final   Performed at [facility], [address], [place], [place] [address]  . Sodium 03/14/2020 133* 135 - 145 mmol/L Final  . Potassium 03/14/2020 4.6  3.5 - 5.1 mmol/L Final  . Chloride 03/14/2020 99  98 - 111 mmol/L Final  . CO2 03/14/2020 23  22 - 32 mmol/L Final  . Glucose, Bld 03/14/2020 94  70 - 99 mg/dL Final   Glucose reference range applies only to samples taken after fasting for at least 8 hours.  . BUN 03/14/2020 27* 8 - 23 mg/dL Final  . Creatinine, Ser 03/14/2020 1.23* 0.44 - 1.00 mg/dL Final  . Calcium 03/14/2020 9.2  8.9 - 10.3 mg/dL Final  . Total Protein 03/14/2020 6.3* 6.5 - 8.1 g/dL Final  . Albumin 03/14/2020 4.0  3.5 - 5.0 g/dL Final  . AST 03/14/2020 19  15 - 41 U/L Final  . ALT 03/14/2020 16  0 - 44 U/L Final  . Alkaline Phosphatase 03/14/2020 85  38 - 126 U/L Final  . Total Bilirubin 03/14/2020 0.6  0.3 - 1.2 mg/dL Final  . GFR calc non Af Amer 03/14/2020 40* >60 mL/min Final  . GFR calc Af Amer 03/14/2020 46* >60 mL/min Final  . Anion gap 03/14/2020 11  5 - 15 Final   Performed at [facility], [address] [place], [place] [address]    Assessment:  [name] is a 86 y.o. female with a history of stage IIIA follicular non-Hodgkin's lymphoma. She presented in the summer of 2003 with an epigastric and left upper quadrant mass and left arm weakness with decreased mobility. Abdominal ultrasound revealed a large mass in the head of the pancreas with multiple enlarged lymph nodes and a large lobulated mass in the right  retroperitoneal area. MRI of the left brachial plexus revealed a 2.5 cm soft tissue mass and abnormal lymph node along the left brachial plexus and in the supraclavicular region.  Cervical node biopsyon 08/06/2002 revealed a grade I follicular non-Hodgkin's lymphoma. Bone marrowon 08/09/2002 was negative. Lumbar puncture was negative. She receivedLeukeran and Rituxan. As her arm pain and mobility was not relieved, she received local radiationto the left brachial plexus area. Leukeran continued through 02/28/2013 (discontinued secondary side effects).  She developed progressive disease in 10/2003. Patient received CVPchemotherapy beginning 01/02/2004. In 06/2004 she was switched to CNOP. She received 8 cycles completing on 01/15/2005. She began maintenance Rituxanon 04/23/2004 (4 weekly doses every 3 months for 2 years). Therapy completed on 09/13/2008.   Excision of a 1 cm right preauricular masson 10/04/2017 revealed a low grade follicular lymphoma. CT guided biopsyof the retroperitoneal nodal masson 11/20/2017 revealed grade I follicular lymphoma. Immunohistochemistry revealed positive for CD10, BCL-2, BCL-6, Ki67 (15-20%) and negative for cyclin D1 and CD5. Flow cytometry revealed a CD10+ clonal B-cell population.   She received 4 weekly cycles of Rituxan(12/24/2017 - 01/14/2018). She is s/p 3 cycles of maintenance Rituxan(03/24/2018 - 10/08/2018).  PET scanon 11/08/2018 revealed a 5.5 x 7.3 cm hypermetabolic (SUV 90.3) right retroperitoneal nodal mass and aortocaval lymph node. CT guided biopsyon 00/92/3300 confirmed follicular lymphoma, grade I.  She received radiation(IMRT) of 3000 cGy to the  retroperitoneal mass from 01/11/2019 - 01/31/2019.  Abdomen and pelvis CTon 03/11/2019 revealed partial treatment response. Right retroperitoneal nodal mass and aortocaval adenopathy haddecreased in size (7.1 x 5.8 cm to 6.5 x 4.1 cm).There was decreased mass effect on the  IVC. There were no new sites of disease.  Abdomen and pelvis CT on 09/12/2019 revealed a significant interval decrease in size of a retroperitoneal mass that measured 4.6 x 2.8 cm (previously 6.5 x 4.1 cm). There was redemonstrated, adjacent post treatment retroperitoneal soft tissue about the inferior vena cava and aorta.  Findings were consistent with ongoing treatment response. There was no discretely enlarged lymph nodes in the abdomen or pelvis.  The following studies were negativeon 10/23/2017: hepatitis B core antibody, hepatitis B surface antigen, hepatitis C antibody. G6PD assay was normal.  She was admitted to [facility]/15/2015 - 08/25/2015 with bloody diarrhea then hemolytic uremic syndromerelated to E. coli 0157. She had a complicated course with acute renal failure and anemia. She was then in rehab for 3 weeks. She continues to work on her balance.  Bone densityon 10/27/2017 revealed osteoporosiswith a T-score of -3.8 in the left forearm radius. Bone density on 11/07/2019 revealed osteoporosis with a T score of -2.5 in the right femoral neck.  She began every 6 month Proliaon 04/20/2018 (last 12/19/2019).  Symptomatically, she notes abdominal discomfort x 1 month.  She has tried Building surveyor, Gas-X without relief.  Exam reveals new right lower extremity edema.  Plan: 1.   Labs today:  CBC with diff, CMP, LDH, uric acid 2.   Stage IIIAfollicular non-Hodgkin's lymphoma Patient is s/p IMRT to the retroperitoneal mass (completed 01/31/2019). Clinically, she denies any B symptoms.   However, she has unexplained abdominal discomfort/pain x 1 month.  CT scan on 09/12/2019 revealedsignificant improvement in the retroperitoneal mass.  Discuss plans for re-imaging secondary to symptoms.   Abdomen and pelvis CT without contrast (secondary to increased creatinine). 3.   Abdominal symptoms  Etiology unclear.  Patient followed by Dr Vicente Males in GI.  EGD on 09/23/2019  was normal.  Add amylase and lipase (doubt pancreatitis).  Abdomen and pelvis CT as above. 4.   Renal insufficiency  Etiology unclear.  Creatinine 1.23 today (previously 0.70 on 12/19/2019).  Plan for CT scan without contrast (CrCl 27.5 ml/min).   Assess for lymphoma and r/o hydronephrosis. 5. Right lower extremity edema  Discuss plans for STAT RLE duplex to r/o DVT.  Additional imaging with CT scan (r/o venous obstruction secondary to recurrent lymphoma). 6.   Osteoporosis Calcium 9.2.               Continue calcium and vitamin D. Bone density on 11/07/2019 revealed osteoporosis with a T score of -2.5 in the right femoral neck.    She received Prolia on 12/19/2019. 7.   RLE duplex STAT. 8.   Abdomen and pelvis CT scan ASAP. 9.   RTC after imaging for MD assess and review of studies.  Addendum:  Right lower extremity duplex revealed no femororpopliteal evidence of DVT within visualized calf veins. There is a Baker's cyst in the RIGHT popliteal fossa.  I discussed the assessment and treatment plan with the patient.  The patient was provided an opportunity to ask questions and all were answered. The patient agreed with the plan and demonstrated an understanding of the instructions.  The patient was advised to call back if the symptoms worsen or if the condition fails to improve as anticipated.  I provided 20 minutes (1:55 PM -  2:15 PM) of face-to-face time during this this encounter and > 50% was spent counseling as documented under my assessment and plan.  An additional 10 minutes were spent reviewing her chart (Epic and Care Everywhere) including notes, labs, and imaging studies.    Lequita Asal, MD, PhD    03/14/2020, 1:55 PM  I, Heywood Footman, am acting as a Education administrator for Lequita Asal, MD.  I, [place] Mike Gip, MD, have reviewed the above documentation for accuracy and completeness, and I agree with the above.

## 2020-03-13 ENCOUNTER — Other Ambulatory Visit: Payer: Medicare Other

## 2020-03-14 ENCOUNTER — Ambulatory Visit
Admission: RE | Admit: 2020-03-14 | Discharge: 2020-03-14 | Disposition: A | Discharge status: Home / Self Care | Payer: Medicare Other | Source: Ambulatory Visit | Attending: Hematology and Oncology | Admitting: Hematology and Oncology

## 2020-03-14 ENCOUNTER — Inpatient Hospital Stay: Payer: Medicare Other | Attending: Hematology and Oncology | Admitting: Hematology and Oncology

## 2020-03-14 ENCOUNTER — Encounter: Payer: Self-pay | Admitting: Hematology and Oncology

## 2020-03-14 ENCOUNTER — Telehealth: Payer: Self-pay

## 2020-03-14 ENCOUNTER — Other Ambulatory Visit: Payer: Self-pay

## 2020-03-14 ENCOUNTER — Inpatient Hospital Stay: Payer: Medicare Other

## 2020-03-14 VITALS — BP 148/79 | HR 83 | Temp 97.6°F | Resp 19 | Wt 114.6 lb

## 2020-03-14 DIAGNOSIS — M7989 Other specified soft tissue disorders: Secondary | ICD-10-CM | POA: Diagnosis not present

## 2020-03-14 DIAGNOSIS — I10 Essential (primary) hypertension: Secondary | ICD-10-CM | POA: Insufficient documentation

## 2020-03-14 DIAGNOSIS — N289 Disorder of kidney and ureter, unspecified: Secondary | ICD-10-CM | POA: Diagnosis not present

## 2020-03-14 DIAGNOSIS — Z8673 Personal history of transient ischemic attack (TIA), and cerebral infarction without residual deficits: Secondary | ICD-10-CM | POA: Diagnosis not present

## 2020-03-14 DIAGNOSIS — Z79899 Other long term (current) drug therapy: Secondary | ICD-10-CM | POA: Insufficient documentation

## 2020-03-14 DIAGNOSIS — C8288 Other types of follicular lymphoma, lymph nodes of multiple sites: Secondary | ICD-10-CM | POA: Diagnosis not present

## 2020-03-14 DIAGNOSIS — K219 Gastro-esophageal reflux disease without esophagitis: Secondary | ICD-10-CM

## 2020-03-14 DIAGNOSIS — C8200 Follicular lymphoma grade I, unspecified site: Secondary | ICD-10-CM

## 2020-03-14 DIAGNOSIS — Z923 Personal history of irradiation: Secondary | ICD-10-CM | POA: Diagnosis not present

## 2020-03-14 DIAGNOSIS — R600 Localized edema: Secondary | ICD-10-CM | POA: Insufficient documentation

## 2020-03-14 DIAGNOSIS — R109 Unspecified abdominal pain: Secondary | ICD-10-CM | POA: Diagnosis not present

## 2020-03-14 DIAGNOSIS — Z8572 Personal history of non-Hodgkin lymphomas: Secondary | ICD-10-CM | POA: Insufficient documentation

## 2020-03-14 DIAGNOSIS — M810 Age-related osteoporosis without current pathological fracture: Secondary | ICD-10-CM

## 2020-03-14 DIAGNOSIS — M1990 Unspecified osteoarthritis, unspecified site: Secondary | ICD-10-CM | POA: Insufficient documentation

## 2020-03-14 LAB — COMPREHENSIVE METABOLIC PANEL
ALT: 16 U/L (ref 0–44)
AST: 19 U/L (ref 15–41)
Albumin: 4 g/dL (ref 3.5–5.0)
Alkaline Phosphatase: 85 U/L (ref 38–126)
Anion gap: 11 (ref 5–15)
BUN: 27 mg/dL — ABNORMAL HIGH (ref 8–23)
CO2: 23 mmol/L (ref 22–32)
Calcium: 9.2 mg/dL (ref 8.9–10.3)
Chloride: 99 mmol/L (ref 98–111)
Creatinine, Ser: 1.23 mg/dL — ABNORMAL HIGH (ref 0.44–1.00)
GFR calc Af Amer: 46 mL/min — ABNORMAL LOW (ref >60)
GFR calc non Af Amer: 40 mL/min — ABNORMAL LOW (ref >60)
Glucose, Bld: 94 mg/dL (ref 70–99)
Potassium: 4.6 mmol/L (ref 3.5–5.1)
Sodium: 133 mmol/L — ABNORMAL LOW (ref 135–145)
Total Bilirubin: 0.6 mg/dL (ref 0.3–1.2)
Total Protein: 6.3 g/dL — ABNORMAL LOW (ref 6.5–8.1)

## 2020-03-14 LAB — CBC WITH DIFFERENTIAL/PLATELET
Abs Immature Granulocytes: 0.02 10*3/uL (ref 0.00–0.07)
Basophils Absolute: 0.1 10*3/uL (ref 0.0–0.1)
Basophils Relative: 1 %
Eosinophils Absolute: 0.1 10*3/uL (ref 0.0–0.5)
Eosinophils Relative: 2 %
HCT: 32.1 % — ABNORMAL LOW (ref 36.0–46.0)
Hemoglobin: 10.8 g/dL — ABNORMAL LOW (ref 12.0–15.0)
Immature Granulocytes: 0 %
Lymphocytes Relative: 4 %
Lymphs Abs: 0.2 10*3/uL — ABNORMAL LOW (ref 0.7–4.0)
MCH: 31.8 pg (ref 26.0–34.0)
MCHC: 33.6 g/dL (ref 30.0–36.0)
MCV: 94.4 fL (ref 80.0–100.0)
Monocytes Absolute: 0.8 10*3/uL (ref 0.1–1.0)
Monocytes Relative: 12 %
Neutro Abs: 5.4 10*3/uL (ref 1.7–7.7)
Neutrophils Relative %: 81 %
Platelets: 300 10*3/uL (ref 150–400)
RBC: 3.4 MIL/uL — ABNORMAL LOW (ref 3.87–5.11)
RDW: 13.3 % (ref 11.5–15.5)
WBC: 6.6 10*3/uL (ref 4.0–10.5)
nRBC: 0 % (ref 0.0–0.2)

## 2020-03-14 LAB — URIC ACID: Uric Acid, Serum: 4 mg/dL (ref 2.5–7.1)

## 2020-03-14 LAB — AMYLASE: Amylase: 106 U/L — ABNORMAL HIGH (ref 28–100)

## 2020-03-14 LAB — LACTATE DEHYDROGENASE: LDH: 129 U/L (ref 98–192)

## 2020-03-14 LAB — LIPASE, BLOOD: Lipase: 24 U/L (ref 11–51)

## 2020-03-14 NOTE — Progress Notes (Signed)
Patient states that she feels like there is something in her abdomen causing pressure and pain. It is worse at night and early morning. No complaint of bowel problems or nausea/vomiting. She is having a bit of gas which she takes beano for.

## 2020-03-14 NOTE — Telephone Encounter (Signed)
Spoke with the patient in office to inform her that, Per Dr Patsy Baltimore she does not have a DVT. I have informed the patient that she is schedule for her CT Scan on 4/7 the patient was ok with delaying her trip for 1 day to get her scans done. The patient was understanding and agreeable.

## 2020-03-15 DIAGNOSIS — M7121 Synovial cyst of popliteal space [Baker], right knee: Secondary | ICD-10-CM

## 2020-03-15 HISTORY — DX: Synovial cyst of popliteal space (Baker), right knee: M71.21

## 2020-03-18 ENCOUNTER — Encounter: Payer: Self-pay | Admitting: Hematology and Oncology

## 2020-03-19 ENCOUNTER — Other Ambulatory Visit: Payer: Self-pay | Admitting: Gastroenterology

## 2020-03-19 ENCOUNTER — Telehealth: Payer: Self-pay

## 2020-03-19 NOTE — Telephone Encounter (Signed)
Left a message To inform the patient i have sent over new patient information to Dr Marry Guan ( Orthopedics office). I have also informed the patient is she have any question please feel free to contact the office.

## 2020-03-19 NOTE — Progress Notes (Signed)
[facility]  [address], [address] [place], [place] [address] Phone: [phone]  Fax: [phone]   Clinic Day:  03/22/2020  Referring physician: Marinda Elk, MD  Chief Complaint: [name] is a 86 y.o. female with a history of stage IIIA follicular non-Hodgkin's lymphoma who is seen for a 1 week assessment and review of imaging.   HPI: The patient was last seen in the medical oncology clinic on 03/14/2020. At that time, exam revealed a slightly tender abdomen and new right lower extremity edema.  Creatinine was 1.23 (previously 0.70).  She was scheduled for abdominal pelvic CT scan.  STAT right lower extremity venous duplex showed no evidence of DVT.   Labs included hematocrit 32.1, hemoglobin 10.8, platelets 300,00, WBC 6,600 (ANC 5400). Sodium was 133 and creatinine 1.23.  LDH 129. Uric acid was 4.0. Lipase was 24 and amylase 106.   Abdomen and pelvis CT on 03/21/2020 revealed moderate right hydroureteronephrosis which appeared to extend to the L5 level, but without definite obstructing calculus.  The cause of the distal ureteral obstruction was unknown.  There was sigmoid diverticulosis without inflammation and several small calcified uterine fibroids.  During the interim, she has felt "not to well". Patient reports progressive right leg and foot swelling associated with pain. Pain increases with walking. She notes having a hard time putting on her socks and shoes.  She notes swelling in the abdomen with associated discomfort that keeps her up at night. She has gained 6 pounds of fluid weight since last visit 03/14/2020. The patient had never been seen by the urologist. Patient agreed to possible stent placement.    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   (E coli 0157)   Hypertension    Meniere's disease    with bilateral hearing loss   Mini stroke [facility])    summer 2014   Osteoporosis     Past Surgical History:  Procedure Laterality Date   APPENDECTOMY     BONE MARROW BIOPSY  08/09/2002   BREAST LUMPECTOMY  1950s   benign   CATARACT EXTRACTION  03/2009   COLONOSCOPY     ESOPHAGOGASTRODUODENOSCOPY (EGD) WITH PROPOFOL N/A 09/23/2019   Procedure: ESOPHAGOGASTRODUODENOSCOPY (EGD) WITH PROPOFOL;  Surgeon: Jonathon Bellows, MD;  Location: [facility] ENDOSCOPY;  Service: Gastroenterology;  Laterality: N/A;   LYMPH NODE BIOPSY  08/07/2002   cervical   TUBAL LIGATION      Family History  Problem Relation Age of Onset   Cancer Father        Throat   Cancer Sister 76       Colon    Social History:  reports that she has never smoked. She has never used smokeless tobacco. She reports that she does not drink alcohol or use drugs. She lives in [place].She and her husband are getting restless staying at home during the pandemic. The patient is alone today.  Allergies:  Allergies  Allergen Reactions   Lisinopril Cough    Current Medications: Current Outpatient Medications  Medication Sig Dispense Refill   Acetaminophen (TYLENOL ARTHRITIS EXT RELIEF PO) Take 1 tablet by mouth daily as needed (pain).     AZELASTINE & FLUTICASONE NA 1 spray each nase twice daily.  calcium-vitamin D (CALCIUM 500/D) 500-200 MG-UNIT tablet Take 1 tablet by mouth 2 (two) times daily.      denosumab (PROLIA) 60 MG/ML SOSY injection Inject 60 mg into the skin every 6 (six) months.      DEXILANT 60 MG capsule TAKE ONE CAPSULE BY MOUTH ONE TIME DAILY  90 capsule 1   Flax Oil-Fish Oil-Borage Oil (FISH OIL-FLAX OIL-BORAGE OIL) CAPS Take 1,000-1,400 mg by mouth daily.     Glucosamine HCl-MSM (GLUCOSAMINE-MSM PO) Take by mouth.     Magnesium Citrate 125 MG CAPS      melatonin 5 MG TABS Take 5 mg by  mouth.     Multiple Vitamin (MULTIVITAMIN) tablet Take 1 tablet by mouth daily.     Multiple Vitamins-Minerals (PRESERVISION AREDS 2 PO) Take 1 tablet by mouth daily.      naproxen sodium (ALEVE) 220 MG tablet Take 220 mg by mouth.     polyethylene glycol (MIRALAX / GLYCOLAX) 17 g packet Take 17 g by mouth daily.     Probiotic Product (PROBIOTIC DAILY PO) Take 2 tablets by mouth daily.      triamcinolone cream (KENALOG) 0.1 % Apply 1 application topically 2 (two) times daily.      Turmeric Curcumin 500 MG CAPS Take 1 capsule by mouth daily.      No current facility-administered medications for this visit.    Review of Systems  Constitutional: Negative for chills, diaphoresis, fever, malaise/fatigue and weight loss (gained 6 lbs).       "Not to well".  HENT: Negative for congestion, ear discharge, ear pain, hearing loss, nosebleeds, sinus pain and sore throat.   Eyes: Negative for blurred vision.  Respiratory: Negative for cough, hemoptysis, sputum production and shortness of breath.   Cardiovascular: Positive for leg swelling (right leg and foot). Negative for chest pain and palpitations.  Gastrointestinal: Positive for abdominal pain (discomfort with associated swelling) and heartburn. Negative for blood in stool, constipation (takes MiraLax), diarrhea, melena, nausea and vomiting.  Genitourinary: Negative.  Negative for dysuria, frequency, hematuria and urgency.  Musculoskeletal: Positive for joint pain (knee pain; RLE). Negative for back pain, myalgias and neck pain.  Skin: Negative for itching and rash.  Neurological: Positive for tremors (essential). Negative for dizziness, weakness and headaches.  Endo/Heme/Allergies: Does not bruise/bleed easily.  Psychiatric/Behavioral: Negative for depression and memory loss. The patient has insomnia (related to abdominal discomfort). The patient is not nervous/anxious.   All other systems reviewed and are negative.  Performance status  (ECOG): 1-2  Vitals Blood pressure (!) 158/73, pulse 68, temperature 97.6 F (36.4 C), temperature source Tympanic, resp. rate 18, weight 120 lb 4.2 oz (54.5 kg), SpO2 99 %.   Physical Exam  Constitutional: She is oriented to person, place, and time. She appears well-developed and well-nourished. No distress.  HENT:  Head: Normocephalic and atraumatic.  Curly gray hair. Mask.  Eyes: Conjunctivae and EOM are normal. No scleral icterus.  Gold rimmed glasses. Blue eyes.  Musculoskeletal:        General: Edema (right lower extremity 1+ distal to knee; no change from last week) present. Normal range of motion.  Neurological: She is alert and oriented to person, place, and time.  Skin: Skin is warm and dry. No rash noted. She is not diaphoretic. No erythema. No pallor.  Psychiatric: She has a normal mood and affect. Her behavior is normal. Judgment and thought content normal.  Nursing note and vitals reviewed.   No visits with results within 3 Day(s)  from this visit.  Latest known visit with results is:  Appointment on 03/14/2020  Component Date Value Ref Range Status   LDH 03/14/2020 129  98 - 192 U/L Final   Performed at [facility], [address], [place], [place] [address]   Uric Acid, Serum 03/14/2020 4.0  2.5 - 7.1 mg/dL Final   Performed at [facility], [address], [place], [place] [address]   WBC 03/14/2020 6.6  4.0 - 10.5 K/uL Final   RBC 03/14/2020 3.40* 3.87 - 5.11 MIL/uL Final   Hemoglobin 03/14/2020 10.8* 12.0 - 15.0 g/dL Final   HCT 03/14/2020 32.1* 36.0 - 46.0 % Final   MCV 03/14/2020 94.4  80.0 - 100.0 fL Final   MCH 03/14/2020 31.8  26.0 - 34.0 pg Final   MCHC 03/14/2020 33.6  30.0 - 36.0 g/dL Final   RDW 03/14/2020 13.3  11.5 - 15.5 % Final   Platelets 03/14/2020 300  150 - 400 K/uL Final   nRBC 03/14/2020 0.0  0.0 - 0.2 % Final   Neutrophils Relative % 03/14/2020 81  % Final   Neutro Abs 03/14/2020 5.4  1.7 - 7.7 K/uL  Final   Lymphocytes Relative 03/14/2020 4  % Final   Lymphs Abs 03/14/2020 0.2* 0.7 - 4.0 K/uL Final   Monocytes Relative 03/14/2020 12  % Final   Monocytes Absolute 03/14/2020 0.8  0.1 - 1.0 K/uL Final   Eosinophils Relative 03/14/2020 2  % Final   Eosinophils Absolute 03/14/2020 0.1  0.0 - 0.5 K/uL Final   Basophils Relative 03/14/2020 1  % Final   Basophils Absolute 03/14/2020 0.1  0.0 - 0.1 K/uL Final   Immature Granulocytes 03/14/2020 0  % Final   Abs Immature Granulocytes 03/14/2020 0.02  0.00 - 0.07 K/uL Final   Performed at [facility], [address], [place], [place] [address]   Sodium 03/14/2020 133* 135 - 145 mmol/L Final   Potassium 03/14/2020 4.6  3.5 - 5.1 mmol/L Final   Chloride 03/14/2020 99  98 - 111 mmol/L Final   CO2 03/14/2020 23  22 - 32 mmol/L Final   Glucose, Bld 03/14/2020 94  70 - 99 mg/dL Final   Glucose reference range applies only to samples taken after fasting for at least 8 hours.   BUN 03/14/2020 27* 8 - 23 mg/dL Final   Creatinine, Ser 03/14/2020 1.23* 0.44 - 1.00 mg/dL Final   Calcium 03/14/2020 9.2  8.9 - 10.3 mg/dL Final   Total Protein 03/14/2020 6.3* 6.5 - 8.1 g/dL Final   Albumin 03/14/2020 4.0  3.5 - 5.0 g/dL Final   AST 03/14/2020 19  15 - 41 U/L Final   ALT 03/14/2020 16  0 - 44 U/L Final   Alkaline Phosphatase 03/14/2020 85  38 - 126 U/L Final   Total Bilirubin 03/14/2020 0.6  0.3 - 1.2 mg/dL Final   GFR calc non Af Amer 03/14/2020 40* >60 mL/min Final   GFR calc Af Amer 03/14/2020 46* >60 mL/min Final   Anion gap 03/14/2020 11  5 - 15 Final   Performed at [facility], [address], [place], [place] [address]   Lipase 03/14/2020 24  11 - 51 U/L Final   Performed at [facility], [address], [place], [place] [address]   Amylase 03/14/2020 106* 28 - 100 U/L Final   Performed at [facility], [address] [place], [place] [address]  Assessment:   LAKARA WEILAND is a 86 y.o. female with a history of stage IIIA follicular non-Hodgkin's lymphoma. She presented in the summer of 2003 with an epigastric and left upper quadrant mass and left arm weakness with decreased mobility. Abdominal ultrasound revealed a large mass in the head of the pancreas with multiple enlarged lymph nodes and a large lobulated mass in the right retroperitoneal area. MRI of the left brachial plexus revealed a 2.5 cm soft tissue mass and abnormal lymph node along the left brachial plexus and in the supraclavicular region.  Cervical node biopsyon 08/06/2002 revealed a grade I follicular non-Hodgkin's lymphoma. Bone marrowon 08/09/2002 was negative. Lumbar puncture was negative. She receivedLeukeran and Rituxan. As her arm pain and mobility was not relieved, she received local radiationto the left brachial plexus area. Leukeran continued through 02/28/2013 (discontinued secondary side effects).  She developed progressive disease in 10/2003. Patient received CVPchemotherapy beginning 01/02/2004. In 06/2004 she was switched to CNOP. She received 8 cycles completing on 01/15/2005. She began maintenance Rituxanon 04/23/2004 (4 weekly doses every 3 months for 2 years). Therapy completed on 09/13/2008.   Excision of a 1 cm right preauricular masson 10/04/2017 revealed a low grade follicular lymphoma. CT guided biopsyof the retroperitoneal nodal masson 11/20/2017 revealed grade I follicular lymphoma. Immunohistochemistry revealed positive for CD10, BCL-2, BCL-6, Ki67 (15-20%) and negative for cyclin D1 and CD5. Flow cytometry revealed a CD10+ clonal B-cell population.   She received 4 weekly cycles of Rituxan(12/24/2017 - 01/14/2018). She is s/p 3 cycles of maintenance Rituxan(03/24/2018 - 10/08/2018).  PET scanon 11/08/2018 revealed a 5.5 x 7.3 cm hypermetabolic (SUV 81.8) right retroperitoneal nodal mass and aortocaval lymph node. CT guided biopsyon  56/31/4970 confirmed follicular lymphoma, grade I.  She received radiation(IMRT) of 3000 cGy to the retroperitoneal mass from 01/11/2019 - 01/31/2019.  Abdomen and pelvis CTon 03/11/2019 revealed partial treatment response. Right retroperitoneal nodal mass and aortocaval adenopathy haddecreased in size (7.1 x 5.8 cm to 6.5 x 4.1 cm).There was decreased mass effect on the IVC. There were no new sites of disease.  Abdomen and pelvis CT on 09/12/2019 revealed a significant interval decrease in size of a retroperitoneal mass that measured 4.6 x 2.8 cm (previously 6.5 x 4.1 cm). There was redemonstrated, adjacent post treatment retroperitoneal soft tissue about the inferior vena cava and aorta.  Findings were consistent with ongoing treatment response. There was no discretely enlarged lymph nodes in the abdomen or pelvis.  Abdomen and pelvis CT on 03/21/2020 revealed moderate right hydroureteronephrosis which appeared to extend to the L5 level, but no definite obstructing calculus is noted.  The cause of the distal ureteral obstruction is unknown. There was sigmoid diverticulosis without inflammation and several small calcified uterine fibroids.  The following studies were negativeon 10/23/2017: hepatitis B core antibody, hepatitis B surface antigen, hepatitis C antibody. G6PD assay was normal.  She was admitted to [facility] 08/15/2015 - 08/25/2015 with bloody diarrhea then hemolytic uremic syndromerelated to E. coli 0157. She had a complicated course with acute renal failure and anemia. She was then in rehab for 3 weeks. She continues to work on her balance.  Bone densityon 10/27/2017 revealed osteoporosiswith a T-score of -3.8 in the left forearm radius. Bone density on 11/07/2019 revealed osteoporosis with a T score of -2.5 in the right femoral neck.  She began every 6 month Proliaon 04/20/2018 (last 12/19/2019).  She has right lower extremity edema.  Right lower extremity  duplex on 03/14/2020 revealed no evidence of DVT.  She  has a 1 month history of abdominal discomfort.  Creatinine has increased.  She has new right sided hydroureteronephrosis.  Abdomen and pelvis CT on 03/21/2020 revealed moderate right hydroureteronephrosis which appeared to extend to the L5 level, but without definite obstructing calculus.  The cause of the distal ureteral obstruction was unknown.  There was sigmoid diverticulosis without inflammation and several small calcified uterine fibroids.  Appears to be interval development of retroperitoneal adenopathy.  Left periaortic lymph node measures 2 cm.  There is a 2.6 cm right paratracheal node.  There is probable bilateral iliac adenopathy.  Adenopathy may be the cause of distal right ureteral obstruction.  Symptomatically, she has ongoing abdominal discomfort and right lower extremity edema.  Creatinine is 1.23 (CrCl 23 ml/min).  Plan: 1.   Review labs from 03/14/2020. 2.Stage IIIAfollicular non-Hodgkin's lymphoma Patient is s/p IMRT to the retroperitoneal mass. Clinically, she notes abdominal symptoms x 1 month.       Abdomen and pelvis CT on 09/12/2019 reveals a significant improvement in the retroperitoneal mass.      Abdominal and pelvic pelvis CT on 03/21/2020 was personally reviewed.  Imaging discussed with radiology.   An addendum was placed on the report.   She appears to have developed retroperitoneal adenopathy although imaging is limited secondary to lack of IV contrast.   There is probable bilateral iliac adenopathy.     Adenopathy may be the cause of her distal right ureteral obstruction       Discuss plans for PET scan after resolution of hydroureteronephrosis.  Rx:  prednisone 30 mg a day x 5 days.  Anticipate initiation of salvage chemotherapy and immunotherapy. 3.   Right hydroureteronephrosis  This appears to be the etiology of her increased creatinine (0.70 to 1.23).  Discuss concern for adenopathy  causing obstruction.  Discuss referral to urology for likely stent placement.   Discussed with Dr Erlene Quan- appt on Tuesday 03/27/2020 at 2:00 PM.  Patient in agreement.   4.   Right lower extremity edema  Suspect etiology is secondary to obstructive adenopathy in abdomen/pelvis limiting venous return.  Duplex on 03/14/2020 revealed no evidence of DVT.  Referral to Dr Delana Meyer (patient well know to him)- contacted.   Discussed with Dr Delana Meyer- appt on Monday 03/26/2020 at 1:45 PM. 5.   Patient feels comfortable at home.  Discussing contacting clinic urgently if change in urine output , increasing RLE edema, shortness of breath or any concerns.  Discuss addressing issues as in inpatient if she is not doing well. 6.   Referral to urology (Dr Erlene Quan). 7.   Referral to vascular surgery (Dr Delana Meyer). 8.   RTC in 1 week for MD assessment.  I discussed the assessment and treatment plan with the patient.  The patient was provided an opportunity to ask questions and all were answered.  The patient agreed with the plan and demonstrated an understanding of the instructions.  The patient was advised to call back if the symptoms worsen or if the condition fails to improve as anticipated.  I provided 22 minutes of face-to-face time during this this encounter and > 50% was spent counseling as documented under my assessment and plan. An additional 15 minutes were spent reviewing her imaging studies with radiology and discussion with Dr Erlene Quan and Dr Delana Meyer for urgent referral.    Lequita Asal, MD, PhD    03/22/2020, 10:10 AM  I, Selena Batten, am acting as scribe for [place]. Mike Gip, MD, PhD.  I, Shaindy Reader C. Mike Gip,  MD, have reviewed the above documentation for accuracy and completeness, and I agree with the above.

## 2020-03-21 ENCOUNTER — Ambulatory Visit
Admission: RE | Admit: 2020-03-21 | Discharge: 2020-03-21 | Disposition: A | Discharge status: Home / Self Care | Payer: Medicare Other | Source: Ambulatory Visit | Attending: Hematology and Oncology | Admitting: Hematology and Oncology

## 2020-03-21 ENCOUNTER — Other Ambulatory Visit: Payer: Self-pay

## 2020-03-21 DIAGNOSIS — C8288 Other types of follicular lymphoma, lymph nodes of multiple sites: Secondary | ICD-10-CM | POA: Diagnosis present

## 2020-03-21 DIAGNOSIS — R109 Unspecified abdominal pain: Secondary | ICD-10-CM | POA: Diagnosis present

## 2020-03-22 ENCOUNTER — Inpatient Hospital Stay: Payer: Medicare Other | Attending: Hematology and Oncology | Admitting: Hematology and Oncology

## 2020-03-22 ENCOUNTER — Encounter: Payer: Self-pay | Admitting: Hematology and Oncology

## 2020-03-22 VITALS — BP 158/73 | HR 68 | Temp 97.6°F | Resp 18 | Wt 120.3 lb

## 2020-03-22 DIAGNOSIS — R600 Localized edema: Secondary | ICD-10-CM | POA: Insufficient documentation

## 2020-03-22 DIAGNOSIS — R0602 Shortness of breath: Secondary | ICD-10-CM | POA: Diagnosis not present

## 2020-03-22 DIAGNOSIS — I10 Essential (primary) hypertension: Secondary | ICD-10-CM | POA: Insufficient documentation

## 2020-03-22 DIAGNOSIS — Z9221 Personal history of antineoplastic chemotherapy: Secondary | ICD-10-CM | POA: Diagnosis not present

## 2020-03-22 DIAGNOSIS — G250 Essential tremor: Secondary | ICD-10-CM | POA: Diagnosis not present

## 2020-03-22 DIAGNOSIS — Z7189 Other specified counseling: Secondary | ICD-10-CM

## 2020-03-22 DIAGNOSIS — C8200 Follicular lymphoma grade I, unspecified site: Secondary | ICD-10-CM

## 2020-03-22 DIAGNOSIS — Z923 Personal history of irradiation: Secondary | ICD-10-CM | POA: Diagnosis not present

## 2020-03-22 DIAGNOSIS — R1310 Dysphagia, unspecified: Secondary | ICD-10-CM | POA: Diagnosis not present

## 2020-03-22 DIAGNOSIS — Z7952 Long term (current) use of systemic steroids: Secondary | ICD-10-CM | POA: Insufficient documentation

## 2020-03-22 DIAGNOSIS — M7989 Other specified soft tissue disorders: Secondary | ICD-10-CM

## 2020-03-22 DIAGNOSIS — N1330 Unspecified hydronephrosis: Secondary | ICD-10-CM

## 2020-03-22 DIAGNOSIS — Z79899 Other long term (current) drug therapy: Secondary | ICD-10-CM | POA: Diagnosis not present

## 2020-03-22 DIAGNOSIS — N131 Hydronephrosis with ureteral stricture, not elsewhere classified: Secondary | ICD-10-CM | POA: Insufficient documentation

## 2020-03-22 DIAGNOSIS — K219 Gastro-esophageal reflux disease without esophagitis: Secondary | ICD-10-CM | POA: Diagnosis not present

## 2020-03-22 DIAGNOSIS — G4700 Insomnia, unspecified: Secondary | ICD-10-CM | POA: Insufficient documentation

## 2020-03-22 DIAGNOSIS — C8298 Follicular lymphoma, unspecified, lymph nodes of multiple sites: Secondary | ICD-10-CM | POA: Insufficient documentation

## 2020-03-22 DIAGNOSIS — Z8673 Personal history of transient ischemic attack (TIA), and cerebral infarction without residual deficits: Secondary | ICD-10-CM | POA: Insufficient documentation

## 2020-03-22 MED ORDER — PREDNISONE 10 MG PO TABS: 30.0000 mg | ORAL_TABLET | Freq: Every day | ORAL | 0 refills | Status: DC

## 2020-03-22 NOTE — Progress Notes (Signed)
Reports swelling in right leg and foot along with pain. Pt also reports swelling in abdomen.

## 2020-03-25 DIAGNOSIS — R109 Unspecified abdominal pain: Secondary | ICD-10-CM | POA: Insufficient documentation

## 2020-03-25 DIAGNOSIS — N289 Disorder of kidney and ureter, unspecified: Secondary | ICD-10-CM | POA: Insufficient documentation

## 2020-03-25 DIAGNOSIS — M7989 Other specified soft tissue disorders: Secondary | ICD-10-CM | POA: Insufficient documentation

## 2020-03-26 ENCOUNTER — Encounter (INDEPENDENT_AMBULATORY_CARE_PROVIDER_SITE_OTHER): Payer: Self-pay | Admitting: Vascular Surgery

## 2020-03-26 ENCOUNTER — Other Ambulatory Visit: Payer: Self-pay

## 2020-03-26 ENCOUNTER — Ambulatory Visit (INDEPENDENT_AMBULATORY_CARE_PROVIDER_SITE_OTHER): Payer: Medicare Other | Admitting: Vascular Surgery

## 2020-03-26 VITALS — BP 130/70 | HR 87 | Resp 16 | Wt 118.0 lb

## 2020-03-26 DIAGNOSIS — M7989 Other specified soft tissue disorders: Secondary | ICD-10-CM

## 2020-03-26 DIAGNOSIS — K219 Gastro-esophageal reflux disease without esophagitis: Secondary | ICD-10-CM | POA: Diagnosis not present

## 2020-03-26 DIAGNOSIS — I722 Aneurysm of renal artery: Secondary | ICD-10-CM

## 2020-03-26 DIAGNOSIS — C8200 Follicular lymphoma grade I, unspecified site: Secondary | ICD-10-CM | POA: Diagnosis not present

## 2020-03-26 NOTE — Progress Notes (Signed)
03/27/20 7:18 AM   [name] 03-Jun-1932 [id number]  Referring provider: Lequita Asal, N[address] [place],  [place] [address]  Chief Complaint  Patient presents with  . Hydronephrosis    HPI: [name] is a 86 y.o. F with  a history of stage IIIA follicular non-Hodgkin's lymphoma who presents today for the evaluation and management of hydronephrosis.   She is being followed by Dr. Mike Gip for follicular lymphoma.   Recent CT scan revealed new hydronephrosis. She also had an acute rise in creatinine.   She reports of lower quadrant abdominal pain radiating to leg w/ swelling onset 3 weeks. Pain relieved by Prednisone.   Denies gross hematuria or dysuria.   PMH: Past Medical History:  Diagnosis Date  . Arthritis    osteoporosis also. prolia treatments every 6 months  . Baker cyst, right 03/2020  . Cancer [facility]) 2013   Non-Hodgkin Lymphoma with chemo and rad tx  . Complication of anesthesia   . Dehydration    low sodium levels  . Dysrhythmia   . Essential tremor   . Follicular lymphoma grade I ([place]) 08/06/2002  . GERD (gastroesophageal reflux disease)   . Hearing loss of both ears    wears bilateral hearing aides  . 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 History of blood transfusion   . History of chemotherapy   . History of radiation therapy   . Hypertension   . Labile blood pressure   . Meniere's disease    with bilateral hearing loss  . Mini stroke [facility])    summer 2014  . Osteoporosis   . PONV (postoperative nausea and vomiting)   . 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 Stroke [facility]) 2015   possible mini stroke. loss of conciousness while sitting at table. could not diagnose  . Syncope     Surgical History: Past Surgical History:  Procedure Laterality Date  . APPENDECTOMY    . BONE MARROW BIOPSY  08/09/2002  . BREAST LUMPECTOMY  1950s   benign  . CATARACT EXTRACTION  03/2009  . COLONOSCOPY    .  ESOPHAGOGASTRODUODENOSCOPY (EGD) WITH PROPOFOL N/A 09/23/2019   Procedure: ESOPHAGOGASTRODUODENOSCOPY (EGD) WITH PROPOFOL;  Surgeon: Jonathon Bellows, MD;  Location: [facility] ENDOSCOPY;  Service: Gastroenterology;  Laterality: N/A;  . FRACTURE SURGERY Left    wrist  . LYMPH NODE BIOPSY  08/07/2002   cervical  . TUBAL LIGATION      Home Medications:  Allergies as of 03/27/2020      Reactions   Lisinopril Cough   Primidone Other (See Comments)   Makes her feel "loopy and unsteady" Also, did not help the tremors      Medication List       Accurate as of March 27, 2020 11:59 PM. If you have any questions, ask your nurse or doctor.        AZELASTINE & FLUTICASONE NA 1 spray each nase twice daily.   Calcium 500/D 500-200 MG-UNIT tablet Generic drug: calcium-vitamin D Take 1 tablet by mouth 2 (two) times daily.   denosumab 60 MG/ML Sosy injection Commonly known as: PROLIA Inject 60 mg into the skin every 6 (six) months.   Dexilant 60 MG capsule Generic drug: dexlansoprazole TAKE ONE CAPSULE BY MOUTH ONE TIME DAILY   Fish Oil-Flax Oil-Borage Oil Caps Take 1,000-1,400 mg by mouth daily.   Glucosamine-Chondroitin-MSM 768-115-726 MG Tabs Take by mouth.   GLUCOSAMINE-MSM PO Take by mouth.  Magnesium Citrate 125 MG Caps   melatonin 5 MG Tabs Take 5 mg by mouth.   multivitamin tablet Take 1 tablet by mouth daily.   naproxen sodium 220 MG tablet Commonly known as: ALEVE Take 220 mg by mouth.   polyethylene glycol 17 g packet Commonly known as: MIRALAX / GLYCOLAX Take 17 g by mouth daily.   predniSONE 10 MG tablet Commonly known as: DELTASONE Take 3 tablets (30 mg total) by mouth daily with breakfast.   PRESERVISION AREDS 2 PO Take 1 tablet by mouth daily.   PROBIOTIC DAILY PO Take 2 tablets by mouth daily.   triamcinolone cream 0.1 % Commonly known as: KENALOG Apply 1 application topically 2 (two) times daily.   Turmeric Curcumin 500 MG Caps Take 1 capsule by  mouth daily.   TYLENOL ARTHRITIS EXT RELIEF PO Take 1 tablet by mouth daily as needed (pain).       Allergies:  Allergies  Allergen Reactions  . Lisinopril Cough  . Primidone Other (See Comments)    Makes her feel "loopy and unsteady" Also, did not help the tremors    Family History: Family History  Problem Relation Age of Onset  . Cancer Father        Throat  . Cancer Sister 81       Colon    Social History:  reports that she has never smoked. She has never used smokeless tobacco. She reports that she does not drink alcohol or use drugs.   Physical Exam: BP (!) 167/78   Pulse 83   Ht 5' 5" (1.651 m)   Wt 118 lb (53.5 kg)   BMI 19.64 kg/m   Constitutional:  Alert and oriented, No acute distress.   Elderly,somewhat frail.  Accompanied by husband today. HEENT: [place] AT, moist mucus membranes.  Trachea midline, no masses. Cardiovascular: No clubbing, cyanosis, or edema. Respiratory: Normal respiratory effort, no increased work of breathing. Skin: No rashes, bruises or suspicious lesions. Neurologic: Grossly intact, no focal deficits, moving all 4 extremities. Psychiatric: Normal mood and affect.  Laboratory Data:  Urinalysis UA today negative   Lab Results  Component Value Date   CREATININE 1.23 (H) 03/14/2020   Pertinent Imaging: ADDENDUM REPORT: 03/22/2020 08:56  ADDENDUM: Upon further review, there does appear to be the interval development of retroperitoneal adenopathy, although evaluation is limited due the lack of intravenous contrast. Left periaortic lymph node measures 2 cm. 2.6 cm right paratracheal lymph node is noted. There is probable bilateral iliac adenopathy is well, although evaluation is limited due to the lack of intravenous contrast. As noted in the impression, CT scan with intravenous and oral contrast is recommended for further evaluation if the patient can tolerate intravenous contrast. This adenopathy may be the cause of distal right  ureteral obstruction.   Electronically Signed   By: James  Green Jr M.D.   On: 03/22/2020 08:56  I have personally reviewed the images and agree with radiologist interpretation.   Assessment & Plan:    1. Right Hydronephrosis  Likely secondary to obstructing lymphadenopathy from follicular lymphoma Urinalysis today negative; sent for urine culture pre-op Discussed right nephrostomy tube vs stent placement with risks and benefits to preserve renal function vs.observation  Dicussed risk of infection, stent discomfort, hematuria, and 6 month stent exchange in OR w/ anesthesia.  Pt understood and agreed with stent placement procedure  Return for symptom recheck with PA-C 1 week post-op with labs (BMP) and RUS   2. Follicular lymphoma  Followed   by Dr. Mike Gip   3. AKI Likely secondary to #1, plan to recheck Cr 1 week post op with RUS to ensure stent function    [facility] [address], [place], [place] [address] [phone]  I, [name], am acting as a scribe for Dr. Hollice Espy,  I have reviewed the above documentation for accuracy and completeness, and I agree with the above.   Hollice Espy, MD

## 2020-03-26 NOTE — H&P (View-Only) (Signed)
03/27/20 7:18 AM   [name] 03-Jun-1932 [id number]  Referring provider: Lequita Asal, [address] [place],  [place] [address]  Chief Complaint  Patient presents with  . Hydronephrosis    HPI: [name] is a 86 y.o. F with  a history of stage IIIA follicular non-Hodgkin's lymphoma who presents today for the evaluation and management of hydronephrosis.   She is being followed by Dr. Mike Gip for follicular lymphoma.   Recent CT scan revealed new hydronephrosis. She also had an acute rise in creatinine.   She reports of lower quadrant abdominal pain radiating to leg w/ swelling onset 3 weeks. Pain relieved by Prednisone.   Denies gross hematuria or dysuria.   PMH: Past Medical History:  Diagnosis Date  . Arthritis    osteoporosis also. prolia treatments every 6 months  . Baker cyst, right 03/2020  . Cancer [facility]) 2013   Non-Hodgkin Lymphoma with chemo and rad tx  . Complication of anesthesia   . Dehydration    low sodium levels  . Dysrhythmia   . Essential tremor   . Follicular lymphoma grade I ([place]) 08/06/2002  . GERD (gastroesophageal reflux disease)   . Hearing loss of both ears    wears bilateral hearing aides  . 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 History of blood transfusion   . History of chemotherapy   . History of radiation therapy   . Hypertension   . Labile blood pressure   . Meniere's disease    with bilateral hearing loss  . Mini stroke [facility])    summer 2014  . Osteoporosis   . PONV (postoperative nausea and vomiting)   . 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 Stroke [facility]) 2015   possible mini stroke. loss of conciousness while sitting at table. could not diagnose  . Syncope     Surgical History: Past Surgical History:  Procedure Laterality Date  . APPENDECTOMY    . BONE MARROW BIOPSY  08/09/2002  . BREAST LUMPECTOMY  1950s   benign  . CATARACT EXTRACTION  03/2009  . COLONOSCOPY    .  ESOPHAGOGASTRODUODENOSCOPY (EGD) WITH PROPOFOL N/A 09/23/2019   Procedure: ESOPHAGOGASTRODUODENOSCOPY (EGD) WITH PROPOFOL;  Surgeon: Jonathon Bellows, MD;  Location: [facility] ENDOSCOPY;  Service: Gastroenterology;  Laterality: N/A;  . FRACTURE SURGERY Left    wrist  . LYMPH NODE BIOPSY  08/07/2002   cervical  . TUBAL LIGATION      Home Medications:  Allergies as of 03/27/2020      Reactions   Lisinopril Cough   Primidone Other (See Comments)   Makes her feel "loopy and unsteady" Also, did not help the tremors      Medication List       Accurate as of March 27, 2020 11:59 PM. If you have any questions, ask your nurse or doctor.        AZELASTINE & FLUTICASONE NA 1 spray each nase twice daily.   Calcium 500/D 500-200 MG-UNIT tablet Generic drug: calcium-vitamin D Take 1 tablet by mouth 2 (two) times daily.   denosumab 60 MG/ML Sosy injection Commonly known as: PROLIA Inject 60 mg into the skin every 6 (six) months.   Dexilant 60 MG capsule Generic drug: dexlansoprazole TAKE ONE CAPSULE BY MOUTH ONE TIME DAILY   Fish Oil-Flax Oil-Borage Oil Caps Take 1,000-1,400 mg by mouth daily.   Glucosamine-Chondroitin-MSM 768-115-726 MG Tabs Take by mouth.   GLUCOSAMINE-MSM PO Take by mouth.  Magnesium Citrate 125 MG Caps   melatonin 5 MG Tabs Take 5 mg by mouth.   multivitamin tablet Take 1 tablet by mouth daily.   naproxen sodium 220 MG tablet Commonly known as: ALEVE Take 220 mg by mouth.   polyethylene glycol 17 g packet Commonly known as: MIRALAX / GLYCOLAX Take 17 g by mouth daily.   predniSONE 10 MG tablet Commonly known as: DELTASONE Take 3 tablets (30 mg total) by mouth daily with breakfast.   PRESERVISION AREDS 2 PO Take 1 tablet by mouth daily.   PROBIOTIC DAILY PO Take 2 tablets by mouth daily.   triamcinolone cream 0.1 % Commonly known as: KENALOG Apply 1 application topically 2 (two) times daily.   Turmeric Curcumin 500 MG Caps Take 1 capsule by  mouth daily.   TYLENOL ARTHRITIS EXT RELIEF PO Take 1 tablet by mouth daily as needed (pain).       Allergies:  Allergies  Allergen Reactions  . Lisinopril Cough  . Primidone Other (See Comments)    Makes her feel "loopy and unsteady" Also, did not help the tremors    Family History: Family History  Problem Relation Age of Onset  . Cancer Father        Throat  . Cancer Sister 47       Colon    Social History:  reports that she has never smoked. She has never used smokeless tobacco. She reports that she does not drink alcohol or use drugs.   Physical Exam: BP (!) 167/78   Pulse 83   Ht _0  (1.651 m)   Wt 118 lb (53.5 kg)   BMI 19.64 kg/m   Constitutional:  Alert and oriented, No acute distress.   Elderly,somewhat frail.  Accompanied by husband today. HEENT: [place] AT, moist mucus membranes.  Trachea midline, no masses. Cardiovascular: No clubbing, cyanosis, or edema. Respiratory: Normal respiratory effort, no increased work of breathing. Skin: No rashes, bruises or suspicious lesions. Neurologic: Grossly intact, no focal deficits, moving all 4 extremities. Psychiatric: Normal mood and affect.  Laboratory Data:  Urinalysis UA today negative   Lab Results  Component Value Date   CREATININE 1.23 (H) 03/14/2020   Pertinent Imaging: ADDENDUM REPORT: 03/22/2020 08:56  ADDENDUM: Upon further review, there does appear to be the interval development of retroperitoneal adenopathy, although evaluation is limited due the lack of intravenous contrast. Left periaortic lymph node measures 2 cm. 2.6 cm right paratracheal lymph node is noted. There is probable bilateral iliac adenopathy is well, although evaluation is limited due to the lack of intravenous contrast. As noted in the impression, CT scan with intravenous and oral contrast is recommended for further evaluation if the patient can tolerate intravenous contrast. This adenopathy may be the cause of distal right  ureteral obstruction.   Electronically Signed   By: Marijo Conception M.D.   On: 03/22/2020 08:56  I have personally reviewed the images and agree with radiologist interpretation.   Assessment & Plan:    1. Right Hydronephrosis  Likely secondary to obstructing lymphadenopathy from follicular lymphoma Urinalysis today negative; sent for urine culture pre-op Discussed right nephrostomy tube vs stent placement with risks and benefits to preserve renal function vs.observation  Dicussed risk of infection, stent discomfort, hematuria, and 6 month stent exchange in OR w/ anesthesia.  Pt understood and agreed with stent placement procedure  Return for symptom recheck with PA-C 1 week post-op with labs (BMP) and RUS   2. Follicular lymphoma  Followed  by Dr. Mike Gip   3. AKI Likely secondary to #1, plan to recheck Cr 1 week post op with RUS to ensure stent function    [facility] [address], [place], [place] [address] [phone]  I, [name], am acting as a scribe for Dr. Hollice Espy,  I have reviewed the above documentation for accuracy and completeness, and I agree with the above.   Hollice Espy, MD

## 2020-03-27 ENCOUNTER — Encounter: Payer: Self-pay | Admitting: Urology

## 2020-03-27 ENCOUNTER — Encounter: Payer: Self-pay | Admitting: Hematology and Oncology

## 2020-03-27 ENCOUNTER — Other Ambulatory Visit: Payer: Self-pay | Admitting: Radiology

## 2020-03-27 ENCOUNTER — Encounter
Admission: RE | Admit: 2020-03-27 | Discharge: 2020-03-27 | Disposition: A | Discharge status: Home / Self Care | Payer: Medicare Other | Source: Ambulatory Visit | Attending: Urology | Admitting: Urology

## 2020-03-27 ENCOUNTER — Ambulatory Visit (INDEPENDENT_AMBULATORY_CARE_PROVIDER_SITE_OTHER): Payer: Medicare Other | Admitting: Urology

## 2020-03-27 ENCOUNTER — Other Ambulatory Visit
Admission: RE | Admit: 2020-03-27 | Discharge: 2020-03-27 | Disposition: A | Discharge status: Home / Self Care | Payer: Medicare Other | Source: Ambulatory Visit | Attending: Urology | Admitting: Urology

## 2020-03-27 ENCOUNTER — Other Ambulatory Visit: Payer: Self-pay

## 2020-03-27 VITALS — BP 167/78 | HR 83 | Ht 65.0 in | Wt 118.0 lb

## 2020-03-27 DIAGNOSIS — N1330 Unspecified hydronephrosis: Secondary | ICD-10-CM

## 2020-03-27 DIAGNOSIS — Z01812 Encounter for preprocedural laboratory examination: Secondary | ICD-10-CM | POA: Diagnosis present

## 2020-03-27 DIAGNOSIS — Z20822 Contact with and (suspected) exposure to covid-19: Secondary | ICD-10-CM | POA: Diagnosis not present

## 2020-03-27 LAB — URINALYSIS, COMPLETE
Bilirubin, UA: NEGATIVE
Glucose, UA: NEGATIVE
Leukocytes,UA: NEGATIVE
Nitrite, UA: NEGATIVE
RBC, UA: NEGATIVE
Specific Gravity, UA: 1.02 (ref 1.005–1.030)
Urobilinogen, Ur: 0.2 mg/dL (ref 0.2–1.0)
pH, UA: 7.5 (ref 5.0–7.5)

## 2020-03-27 LAB — MICROSCOPIC EXAMINATION
Bacteria, UA: NONE SEEN
RBC, Urine: NONE SEEN /hpf (ref 0–2)

## 2020-03-27 NOTE — Patient Instructions (Signed)
INSTRUCTIONS FOR SURGERY     Your surgery is scheduled for:   Thursday, April 15TH     To find out your arrival time for the day of surgery,          please call [phone] between 1 pm and 3 pm on :  Wednesday, April 14TH     When you arrive for surgery, report to the [facility].       Do NOT stop on the first floor to register.    REMEMBER: Instructions that are not followed completely may result in serious medical risk,  up to and including death, or upon the discretion of your surgeon and anesthesiologist,            your surgery may need to be rescheduled.  __X__ 1. Do not eat food after midnight the night before your procedure.                    No gum, candy, lozenger, tic tacs, tums or hard candies.                  ABSOLUTELY NOTHING SOLID IN YOUR MOUTH AFTER MIDNIGHT                    You may drink unlimited clear liquids up to 2 hours before you are scheduled to arrive for surgery.                   Do not drink anything within those 2 hours unless you need to take medicine, then take the                   smallest amount you need.  Clear liquids include:  water, apple juice without pulp,                   any flavor Gatorade, Black coffee, black tea.  Sugar may be added but no dairy/ honey /lemon.                        Broth and jello is not considered a clear liquid.  __x__  2. On the morning of surgery, please brush your teeth with toothpaste and water. You may rinse with                  mouthwash if you wish but DO NOT SWALLOW TOOTHPASTE OR MOUTHWASH  __X___3. NO alcohol for 24 hours before or after surgery.  __x___ 4.  Do NOT smoke or use e-cigarettes for 24 HOURS PRIOR TO SURGERY.                      DO NOT Use any chewable tobacco products for at least 6 hours prior to surgery.  __x___ 5. If you start any new medication after this appointment and prior to surgery, please        Bring it with you on the day of surgery.  ___x__ 6. Notify your doctor if there is any change in your medical condition, such as fever,  infection, vomitting, diarrhea or any open sores.  __x___ 7.  USE ANTIBACTERIAL SOAP as instructed, the night before surgery                    Once you have washed with this soap, do NOT use any of the following: Powders, perfumes                    or lotions. Please do not wear make up, hairpins, clips or nail polish. You MAY wear deodorant.                   Men may shave their face and neck.  Women need to shave 48 hours prior to surgery.                   DO NOT wear ANY jewelry on the day of surgery. If there are rings that are too tight to                    remove easily, please address this prior to the surgery day. Piercings need to be removed.                                                                     NO METAL ON YOUR BODY.                    Do NOT bring any valuables.  If you came to Pre-Admit testing then you will not need license,                     insurance card or credit card.  If you will be staying overnight, please either leave your things in                     the car or have your family be responsible for these items.                     [facility] IS NOT RESPONSIBLE FOR BELONGINGS OR VALUABLES.  ___X__ 8. DO NOT wear contact lenses on surgery day.  You may not have dentures,                     Hearing aides, contacts or glasses in the operating room. These items can be                    Placed in the Recovery Room to receive immediately after surgery.  __x___ 9. IF YOU ARE SCHEDULED TO GO HOME ON THE SAME DAY, YOU MUST                   Have someone to drive you home and to stay with you  for the first 24 hours.                    Have an arrangement prior to arriving on surgery day.  ___x__ 10. Take the following medications on the morning of surgery with a sip of water:  1.  DEXILANT                     2.  NASAL SPRAY                     3.                    _____ 11.  Follow any instructions provided to you by your surgeon.                        Such as enema, clear liquid bowel prep  __X__  12. STOP ALL ASPIRIN PRODUCTS NOW. 03/27/20                       THIS INCLUDES BC POWDERS / GOODIES POWDER  __x___ 13. STOP Anti-inflammatories as of: NOW. 03/27/20                      This includes IBUPROFEN / MOTRIN / ADVIL / ALEVE/ NAPROXYN                    YOU MAY TAKE TYLENOL ANY TIME PRIOR TO SURGERY.  __X___ 14.  Stop supplements until after surgery.                     This includes:  CALCIUM + D // FISH OIL // GLUCOSAMINE //TURMERIC // MAGNESIUM                 You may continue taking Vitamin B12 / Vitamin D3 but do not take on the morning of surgery.  ______18.     Wear clean and comfortable clothing to the hospital.  [facility] AND [facility]IV[facility] WILL DOCUMENTS TO [place].

## 2020-03-28 ENCOUNTER — Encounter (INDEPENDENT_AMBULATORY_CARE_PROVIDER_SITE_OTHER): Payer: Self-pay | Admitting: Vascular Surgery

## 2020-03-28 LAB — SARS CORONAVIRUS 2 (TAT 6-24 HRS): SARS Coronavirus 2: NEGATIVE

## 2020-03-28 NOTE — Progress Notes (Signed)
MRN : [id number]  [name] is a 86 y.o. (05/28/1932) female who presents with chief complaint of  Chief Complaint  Patient presents with   Follow-up    rle swelling  .  History of Present Illness:  Patient is seen sooner than expected.  She has developed painful right lower extremity swelling.  Recently she is been found to have lymphoma involving the pelvic region and this has caused obstructive hydronephrosis.  She is being followed by Dr. Erlene Quan and Dr. Mike Gip.  She describes the pain is beginning in the groin area and radiating down the inside thigh to the knee.  She describes it as a painful numbness or burning.  Because of the right lower extremity symptoms she did undergo duplex ultrasound to evaluate for DVT.  No DVT was identified.  She denies shortness of breath or pleuritic chest pain.  There is no history of DVT.  Current Meds  Medication Sig   Acetaminophen (TYLENOL ARTHRITIS EXT RELIEF PO) Take 1 tablet by mouth daily as needed (pain).   AZELASTINE & FLUTICASONE NA 1 spray each nase twice daily.   calcium-vitamin D (CALCIUM 500/D) 500-200 MG-UNIT tablet Take 1 tablet by mouth 2 (two) times daily.    denosumab (PROLIA) 60 MG/ML SOSY injection Inject 60 mg into the skin every 6 (six) months.    DEXILANT 60 MG capsule TAKE ONE CAPSULE BY MOUTH ONE TIME DAILY    Flax Oil-Fish Oil-Borage Oil (FISH OIL-FLAX OIL-BORAGE OIL) CAPS Take 1,000-1,400 mg by mouth daily.   Glucosamine HCl-MSM (GLUCOSAMINE-MSM PO) Take by mouth.   Magnesium Citrate 125 MG CAPS    melatonin 5 MG TABS Take 5 mg by mouth.   Multiple Vitamin (MULTIVITAMIN) tablet Take 1 tablet by mouth daily.   Multiple Vitamins-Minerals (PRESERVISION AREDS 2 PO) Take 1 tablet by mouth daily.    naproxen sodium (ALEVE) 220 MG tablet Take 220 mg by mouth.   polyethylene glycol (MIRALAX / GLYCOLAX) 17 g packet Take 17 g by mouth daily.   predniSONE (DELTASONE) 10 MG tablet Take 3 tablets  (30 mg total) by mouth daily with breakfast. (Patient not taking: Reported on 03/27/2020)   Probiotic Product (PROBIOTIC DAILY PO) Take 2 tablets by mouth daily.    triamcinolone cream (KENALOG) 0.1 % Apply 1 application topically 2 (two) times daily.    Turmeric Curcumin 500 MG CAPS Take 1 capsule by mouth daily.     Past Medical History:  Diagnosis Date   Arthritis    osteoporosis also. 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 loss of conciousness while sitting at table. could not diagnose   Syncope     Past Surgical History:  Procedure Laterality Date   APPENDECTOMY     BONE MARROW BIOPSY  08/09/2002   BREAST LUMPECTOMY  1950s   benign   CATARACT EXTRACTION  03/2009   COLONOSCOPY     ESOPHAGOGASTRODUODENOSCOPY (EGD) WITH PROPOFOL N/A 09/23/2019   Procedure: ESOPHAGOGASTRODUODENOSCOPY (EGD) WITH PROPOFOL;  Surgeon: Jonathon Bellows, MD;  Location: [facility] ENDOSCOPY;  Service: Gastroenterology;  Laterality: N/A;   FRACTURE SURGERY Left    wrist   LYMPH NODE BIOPSY  08/07/2002   cervical   TUBAL LIGATION      Social History Social History   Tobacco Use   Smoking status: Never Smoker    Smokeless tobacco: Never Used  Substance Use Topics   Alcohol use: No    Alcohol/week: 0.0 standard drinks    Comment: rare   Drug use: No    Family History Family History  Problem Relation Age of Onset   Cancer Father        Throat   Cancer Sister 1       Colon    Allergies  Allergen Reactions   Lisinopril Cough   Primidone Other (See Comments)    Makes her feel "loopy and unsteady" Also, did not help the tremors     REVIEW OF SYSTEMS (Negative unless checked)  Constitutional: [] Weight loss  [] Fever  [] Chills Cardiac: [] Chest pain   [] Chest pressure   [] Palpitations   [] Shortness of breath when laying flat   [] Shortness of breath with exertion. Vascular:  [] Pain in legs with walking   [x] Pain in legs at rest  [] History of DVT   [] Phlebitis   [x] Swelling in legs   [] Varicose veins   [] Non-healing ulcers Pulmonary:   [] Uses home oxygen   [] Productive cough   [] Hemoptysis   [] Wheeze  [] COPD   [] Asthma Neurologic:  [] Dizziness   [] Seizures   [] History of stroke   [] History of TIA  [] Aphasia   [] Vissual changes   [] Weakness or numbness in arm   [] Weakness or numbness in leg Musculoskeletal:   [] Joint swelling   [] Joint pain   [] Low back pain Hematologic:  [] Easy bruising  [] Easy bleeding   [] Hypercoagulable state   [] Anemic Gastrointestinal:  [] Diarrhea   [] Vomiting  [] Gastroesophageal reflux/heartburn   [] Difficulty swallowing. Genitourinary:  [] Chronic kidney disease   [] Difficult urination  [] Frequent urination   [] Blood in urine Skin:  [] Rashes   [] Ulcers  Psychological:  [] History of anxiety   []  History of major depression.  Physical Examination  Vitals:   03/26/20 1402  BP: 130/70  Pulse: 87  Resp: 16  Weight: 118 lb (53.5 kg)   Body mass index is 19.64 kg/m. Gen: WD/WN, NAD Head: [place]/AT, No temporalis wasting.  Ear/Nose/Throat: Hearing grossly intact, nares w/o erythema or drainage Eyes: PER, EOMI, sclera nonicteric.  Neck: Supple, no large masses.     Pulmonary:  Good air movement, no audible wheezing bilaterally, no use of accessory muscles.  Cardiac: RRR, no JVD Vascular: Right lower extremity demonstrates 1-2+ edema.  Is nontender to palpation.  There are mild venous stasis changes.  Scattered varicosities noted. Vessel Right Left  Radial Palpable Palpable  Gastrointestinal: Non-distended. No guarding/no peritoneal signs.  Musculoskeletal: M/S 5/5 throughout.  No deformity or atrophy.  Neurologic: CN 2-12 intact. Symmetrical.  Speech is fluent. Motor exam as listed above. Psychiatric: Judgment intact, Mood & affect appropriate for pt's clinical  situation. Dermatologic: No rashes or ulcers noted.  No changes consistent with cellulitis.   CBC Lab Results  Component Value Date   WBC 6.6 03/14/2020   HGB 10.8 (L) 03/14/2020   HCT 32.1 (L) 03/14/2020   MCV 94.4 03/14/2020   PLT 300 03/14/2020    BMET    Component Value Date/Time   NA 133 (L) 03/14/2020 1316   NA 125 (L) 11/26/2013 0954   K 4.6 03/14/2020 1316   K 3.2 (L) 11/26/2013 0954   CL 99 03/14/2020 1316   CL 91 (L) 11/26/2013 0954   CO2 23 03/14/2020 1316   CO2 29 11/26/2013 0954   GLUCOSE 94 03/14/2020 1316   GLUCOSE 96 11/26/2013 0954   BUN 27 (H) 03/14/2020 1316   BUN 10 11/26/2013 0954   CREATININE 1.23 (H) 03/14/2020 1316   CREATININE 0.70 11/26/2013 0954   CALCIUM 9.2 03/14/2020 1316   CALCIUM 8.5 11/26/2013 0954   GFRNONAA 40 (L) 03/14/2020 1316   GFRNONAA >60 11/26/2013 0954   GFRAA 46 (L) 03/14/2020 1316   GFRAA >60 11/26/2013 0954   Estimated Creatinine Clearance: 28.2 mL/min (A) (by C-G formula based on SCr of 1.23 mg/dL (H)).  COAG Lab Results  Component Value Date   INR 0.90 11/23/2018   INR 0.91 11/20/2017    Radiology CT ABDOMEN PELVIS WO CONTRAST  Addendum Date: 03/22/2020   ADDENDUM REPORT: 03/22/2020 08:56 ADDENDUM: Upon further review, there does appear to be the interval development of retroperitoneal adenopathy, although  evaluation is limited due the lack of intravenous contrast. Left periaortic lymph node measures 2 cm. 2.6 cm right paratracheal lymph node is noted. There is probable bilateral iliac adenopathy is well, although evaluation is limited due to the lack of intravenous contrast. As noted in the impression, CT scan with intravenous and oral contrast is recommended for further evaluation if the patient can tolerate intravenous contrast. This adenopathy may be the cause of distal right ureteral obstruction. Electronically Signed   By: Marijo Conception M.D.   On: 03/22/2020 08:56   Result Date: 03/22/2020 CLINICAL DATA:  Right-sided abdominal pain. EXAM: CT ABDOMEN AND PELVIS WITHOUT CONTRAST TECHNIQUE: Multidetector CT imaging of the abdomen and pelvis was performed following the standard protocol without IV contrast. COMPARISON:  September 12, 2019. FINDINGS: Lower chest: No acute abnormality. Hepatobiliary: No focal liver abnormality is seen. No gallstones, gallbladder wall thickening, or biliary dilatation. Pancreas: Unremarkable. No pancreatic ductal dilatation or surrounding inflammatory changes. Spleen: Calcified splenic granulomata are noted. Adrenals/Urinary Tract: Adrenal glands appear normal. Left kidney and ureter are unremarkable. Moderate right hydroureteronephrosis is noted which appears to extend to the L5 level, but no definite obstructing calculus is noted. Urinary bladder is decompressed. Stomach/Bowel: Stomach is unremarkable. There is no evidence of bowel obstruction or inflammation. Stool is seen throughout the colon. Sigmoid diverticulosis is noted without inflammation. The appendix is not clearly visualized, but no inflammation is noted in the right lower quadrant. Vascular/Lymphatic: Aortic atherosclerosis. No enlarged abdominal or pelvic lymph nodes. Reproductive: Several small calcified uterine fibroids may be present. No definite adnexal abnormality is noted. Other: No abdominal wall hernia or  abnormality. No abdominopelvic ascites. Musculoskeletal: No acute or significant osseous findings. IMPRESSION: 1. Moderate right hydroureteronephrosis is noted which appears to extend to the L5 level, but no definite obstructing calculus is noted. Cause of distal ureteral obstruction is unknown. CT scan with intravenous contrast may be performed for further evaluation. 2. Sigmoid diverticulosis without inflammation. 3. Several small calcified uterine  fibroids. Aortic Atherosclerosis (ICD10-I70.0). Electronically Signed: By: Marijo Conception M.D. On: 03/21/2020 15:51   US Venous Img Lower Unilateral Right (DVT)  Result Date: 03/14/2020 CLINICAL DATA:  RIGHT leg swelling for 1 week question deep venous thrombosis EXAM: RIGHT LOWER EXTREMITY VENOUS DOPPLER ULTRASOUND TECHNIQUE: Gray-scale sonography with compression, as well as color and duplex ultrasound, were performed to evaluate the deep venous system(s) from the level of the common femoral vein through the popliteal and proximal calf veins. COMPARISON:  None FINDINGS: VENOUS Normal compressibility of the common femoral, superficial femoral, and popliteal veins, as well as the visualized calf veins. Visualized portions of profunda femoral vein and great saphenous vein unremarkable. No filling defects to suggest DVT on grayscale or color Doppler imaging. Doppler waveforms show normal direction of venous flow, normal respiratory phasicity and response to augmentation. Limited views of the contralateral common femoral vein are unremarkable. OTHER Incidentally noted is presence of a Baker cyst at the RIGHT popliteal fossa, 4.1 x 1.0 x 3.4 cm in size. Limitations: none IMPRESSION: No femoropopliteal DVT nor evidence of DVT within the visualized calf veins. Baker cyst RIGHT popliteal fossa. If clinical symptoms are inconsistent or if there are persistent or worsening symptoms, further imaging (possibly involving the iliac veins) may be warranted. Electronically Signed    By: Lavonia Dana M.D.   On: 03/14/2020 15:56     Assessment/Plan 1. Swelling of right lower extremity The patient has developed a painful swelling of the right lower extremity in association with lymphoma that is obstructing her right ureter.  Duplex ultrasound is negative for DVT in the right lower extremity.  The description of the painful symptoms matches the superficial femoral nerve distribution.  These findings would suggest that the malignancy is compressing not only the ureter but likely her iliac vein and probably the superficial femoral nerve.  She does have some mild renal insufficiency so the CT scan was done without intravenous contrast.  This makes evaluating the degree of venous compression in the pelvic region exceedingly difficult.  I did discuss the potential for a right pelvic venogram which would also allow [place] to stent the iliac vein if venography confirms external compression.  This would certainly relieve her lower extremity edema and improve any pain related to her venous congestion.  Having said that the discomfort that she is describing to me in the office matches a neuropathic irritation and treatment of her venous stenosis would not improve this aspect.  I discussed this with her in detail.  At this time she wishes to consider this but it is not moving forward with venography  2. Follicular lymphoma grade I, unspecified body region [facility]) Plan per Dr. Mike Gip and Dr. Erlene Quan  3. Aneurysm of renal artery (HCC) We will continue to follow this as arranged.  The present time it is small and does not need intervention.  4. Gastroesophageal reflux disease without esophagitis Continue PPI as already ordered, this medication has been reviewed and there are no changes at this time.  Avoidence of caffeine and alcohol  Moderate elevation of the head of the bed     Hortencia Pilar, MD  03/28/2020 3:35 PM

## 2020-03-29 ENCOUNTER — Encounter
Admission: RE | Disposition: A | Discharge status: Home / Self Care | Payer: Self-pay | Source: Home / Self Care | Attending: Urology

## 2020-03-29 ENCOUNTER — Other Ambulatory Visit: Payer: Self-pay

## 2020-03-29 ENCOUNTER — Inpatient Hospital Stay: Payer: Medicare Other | Admitting: Hematology and Oncology

## 2020-03-29 ENCOUNTER — Ambulatory Visit: Payer: Medicare Other

## 2020-03-29 ENCOUNTER — Ambulatory Visit: Payer: Self-pay | Admitting: Urology

## 2020-03-29 ENCOUNTER — Encounter: Payer: Self-pay | Admitting: Urology

## 2020-03-29 ENCOUNTER — Ambulatory Visit: Payer: Medicare Other | Admitting: Anesthesiology

## 2020-03-29 ENCOUNTER — Ambulatory Visit
Admission: RE | Admit: 2020-03-29 | Discharge: 2020-03-29 | Disposition: A | Discharge status: Home / Self Care | Payer: Medicare Other | Attending: Urology | Admitting: Urology

## 2020-03-29 DIAGNOSIS — Z888 Allergy status to other drugs, medicaments and biological substances status: Secondary | ICD-10-CM | POA: Insufficient documentation

## 2020-03-29 DIAGNOSIS — H8109 Meniere's disease, unspecified ear: Secondary | ICD-10-CM | POA: Diagnosis not present

## 2020-03-29 DIAGNOSIS — C8290 Follicular lymphoma, unspecified, unspecified site: Secondary | ICD-10-CM | POA: Diagnosis present

## 2020-03-29 DIAGNOSIS — Z9221 Personal history of antineoplastic chemotherapy: Secondary | ICD-10-CM | POA: Diagnosis not present

## 2020-03-29 DIAGNOSIS — I69312 Visuospatial deficit and spatial neglect following cerebral infarction: Secondary | ICD-10-CM | POA: Diagnosis not present

## 2020-03-29 DIAGNOSIS — Z923 Personal history of irradiation: Secondary | ICD-10-CM | POA: Diagnosis not present

## 2020-03-29 DIAGNOSIS — N1330 Unspecified hydronephrosis: Secondary | ICD-10-CM | POA: Insufficient documentation

## 2020-03-29 DIAGNOSIS — C8230 Follicular lymphoma grade IIIa, unspecified site: Secondary | ICD-10-CM | POA: Diagnosis not present

## 2020-03-29 DIAGNOSIS — Z79899 Other long term (current) drug therapy: Secondary | ICD-10-CM | POA: Insufficient documentation

## 2020-03-29 DIAGNOSIS — I69398 Other sequelae of cerebral infarction: Secondary | ICD-10-CM | POA: Insufficient documentation

## 2020-03-29 DIAGNOSIS — I10 Essential (primary) hypertension: Secondary | ICD-10-CM | POA: Insufficient documentation

## 2020-03-29 DIAGNOSIS — Z9849 Cataract extraction status, unspecified eye: Secondary | ICD-10-CM | POA: Insufficient documentation

## 2020-03-29 DIAGNOSIS — R519 Headache, unspecified: Secondary | ICD-10-CM | POA: Diagnosis not present

## 2020-03-29 DIAGNOSIS — D649 Anemia, unspecified: Secondary | ICD-10-CM | POA: Insufficient documentation

## 2020-03-29 DIAGNOSIS — Z800 Family history of malignant neoplasm of digestive organs: Secondary | ICD-10-CM | POA: Insufficient documentation

## 2020-03-29 DIAGNOSIS — Z01810 Encounter for preprocedural cardiovascular examination: Secondary | ICD-10-CM

## 2020-03-29 DIAGNOSIS — K219 Gastro-esophageal reflux disease without esophagitis: Secondary | ICD-10-CM | POA: Insufficient documentation

## 2020-03-29 DIAGNOSIS — G250 Essential tremor: Secondary | ICD-10-CM | POA: Diagnosis not present

## 2020-03-29 DIAGNOSIS — H9193 Unspecified hearing loss, bilateral: Secondary | ICD-10-CM | POA: Diagnosis not present

## 2020-03-29 DIAGNOSIS — M1990 Unspecified osteoarthritis, unspecified site: Secondary | ICD-10-CM | POA: Diagnosis not present

## 2020-03-29 DIAGNOSIS — N179 Acute kidney failure, unspecified: Secondary | ICD-10-CM | POA: Insufficient documentation

## 2020-03-29 DIAGNOSIS — M810 Age-related osteoporosis without current pathological fracture: Secondary | ICD-10-CM | POA: Diagnosis not present

## 2020-03-29 HISTORY — DX: Nausea with vomiting, unspecified: R11.2

## 2020-03-29 HISTORY — DX: Other complications of anesthesia, initial encounter: T88.59XA

## 2020-03-29 HISTORY — PX: CYSTOSCOPY WITH STENT PLACEMENT: SHX5790

## 2020-03-29 HISTORY — DX: Essential tremor: G25.0

## 2020-03-29 HISTORY — DX: Aneurysm of renal artery: I72.2

## 2020-03-29 HISTORY — DX: Other specified postprocedural states: Z98.890

## 2020-03-29 HISTORY — DX: Other specified symptoms and signs involving the circulatory and respiratory systems: R09.89

## 2020-03-29 HISTORY — DX: Personal history of other medical treatment: Z92.89

## 2020-03-29 HISTORY — DX: Personal history of antineoplastic chemotherapy: Z92.21

## 2020-03-29 HISTORY — DX: Dehydration: E86.0

## 2020-03-29 HISTORY — DX: Personal history of irradiation: Z92.3

## 2020-03-29 HISTORY — DX: Syncope and collapse: R55

## 2020-03-29 HISTORY — DX: Unspecified hearing loss, bilateral: H91.93

## 2020-03-29 LAB — FIBRINOGEN: Fibrinogen: 395 mg/dL (ref 210–475)

## 2020-03-29 LAB — CBC
HCT: 32.2 % — ABNORMAL LOW (ref 36.0–46.0)
Hemoglobin: 11 g/dL — ABNORMAL LOW (ref 12.0–15.0)
MCH: 32 pg (ref 26.0–34.0)
MCHC: 34.2 g/dL (ref 30.0–36.0)
MCV: 93.6 fL (ref 80.0–100.0)
Platelets: 283 10*3/uL (ref 150–400)
RBC: 3.44 MIL/uL — ABNORMAL LOW (ref 3.87–5.11)
RDW: 13 % (ref 11.5–15.5)
WBC: 10 10*3/uL (ref 4.0–10.5)
nRBC: 0 % (ref 0.0–0.2)

## 2020-03-29 LAB — PROTIME-INR
INR: 1 (ref 0.8–1.2)
Prothrombin Time: 13.2 seconds (ref 11.4–15.2)

## 2020-03-29 LAB — APTT: aPTT: 26 seconds (ref 24–36)

## 2020-03-29 SURGERY — CYSTOSCOPY, WITH STENT INSERTION
Anesthesia: General | Laterality: Right

## 2020-03-29 MED ORDER — OXYBUTYNIN CHLORIDE 5 MG PO TABS: 5.0000 mg | ORAL_TABLET | Freq: Three times a day (TID) | ORAL | 0 refills | Status: DC | PRN

## 2020-03-29 MED ORDER — FENTANYL CITRATE (PF) 100 MCG/2ML IJ SOLN
INTRAMUSCULAR | Status: AC
  Administered 2020-03-29: 09:00:00 25 ug via INTRAVENOUS
  Filled 2020-03-29: qty 2

## 2020-03-29 MED ORDER — PROPOFOL 10 MG/ML IV BOLUS
INTRAVENOUS | Status: DC | PRN
  Administered 2020-03-29: 50 mg via INTRAVENOUS
  Administered 2020-03-29: 100 mg via INTRAVENOUS

## 2020-03-29 MED ORDER — EPHEDRINE SULFATE 50 MG/ML IJ SOLN
INTRAMUSCULAR | Status: DC | PRN
  Administered 2020-03-29: 10 mg via INTRAVENOUS

## 2020-03-29 MED ORDER — IOHEXOL 180 MG/ML  SOLN
INTRAMUSCULAR | Status: DC | PRN
  Administered 2020-03-29: 08:00:00 20 mL

## 2020-03-29 MED ORDER — LIDOCAINE HCL (PF) 2 % IJ SOLN
INTRAMUSCULAR | Status: AC
  Filled 2020-03-29: qty 5

## 2020-03-29 MED ORDER — TAMSULOSIN HCL 0.4 MG PO CAPS: 0.4000 mg | ORAL_CAPSULE | Freq: Every day | ORAL | 0 refills | Status: DC

## 2020-03-29 MED ORDER — FENTANYL CITRATE (PF) 100 MCG/2ML IJ SOLN
25.0000 ug | INTRAMUSCULAR | Status: DC | PRN
  Administered 2020-03-29: 09:00:00 25 ug via INTRAVENOUS

## 2020-03-29 MED ORDER — DEXAMETHASONE SODIUM PHOSPHATE 10 MG/ML IJ SOLN
INTRAMUSCULAR | Status: DC | PRN
  Administered 2020-03-29: 5 mg via INTRAVENOUS

## 2020-03-29 MED ORDER — CEFAZOLIN SODIUM-DEXTROSE 2-4 GM/100ML-% IV SOLN
2.0000 g | INTRAVENOUS | Status: AC
  Administered 2020-03-29: 2 g via INTRAVENOUS

## 2020-03-29 MED ORDER — PROPOFOL 10 MG/ML IV BOLUS
INTRAVENOUS | Status: AC
  Filled 2020-03-29: qty 20

## 2020-03-29 MED ORDER — ONDANSETRON HCL 4 MG/2ML IJ SOLN
INTRAMUSCULAR | Status: DC | PRN
  Administered 2020-03-29: 4 mg via INTRAVENOUS

## 2020-03-29 MED ORDER — OXYBUTYNIN CHLORIDE 5 MG PO TABS
ORAL_TABLET | ORAL | Status: AC
  Filled 2020-03-29: qty 1

## 2020-03-29 MED ORDER — FENTANYL CITRATE (PF) 100 MCG/2ML IJ SOLN
INTRAMUSCULAR | Status: DC | PRN
  Administered 2020-03-29: 25 ug via INTRAVENOUS

## 2020-03-29 MED ORDER — LIDOCAINE HCL (CARDIAC) PF 100 MG/5ML IV SOSY
PREFILLED_SYRINGE | INTRAVENOUS | Status: DC | PRN
  Administered 2020-03-29: 60 mg via INTRAVENOUS

## 2020-03-29 MED ORDER — EPHEDRINE 5 MG/ML INJ
INTRAVENOUS | Status: AC
  Filled 2020-03-29: qty 10

## 2020-03-29 MED ORDER — CEFAZOLIN SODIUM-DEXTROSE 2-4 GM/100ML-% IV SOLN
INTRAVENOUS | Status: AC
  Filled 2020-03-29: qty 100

## 2020-03-29 MED ORDER — LACTATED RINGERS IV SOLN: INTRAVENOUS | Status: DC

## 2020-03-29 MED ORDER — OXYBUTYNIN CHLORIDE 5 MG PO TABS
5.0000 mg | ORAL_TABLET | Freq: Three times a day (TID) | ORAL | Status: DC | PRN
  Administered 2020-03-29: 5 mg via ORAL
  Filled 2020-03-29: qty 1

## 2020-03-29 MED ORDER — FENTANYL CITRATE (PF) 100 MCG/2ML IJ SOLN
INTRAMUSCULAR | Status: AC
  Filled 2020-03-29: qty 2

## 2020-03-29 SURGICAL SUPPLY — 20 items
BAG DRAIN CYSTO-URO LG1000N (MISCELLANEOUS) ×2 IMPLANT
BRUSH SCRUB EZ 1% IODOPHOR (MISCELLANEOUS) ×2 IMPLANT
CATH URETL 5X70 OPEN END (CATHETERS) ×2 IMPLANT
GLIDEWIRE STIFF .35X180X3 HYDR (WIRE) ×1 IMPLANT
GLOVE BIO SURGEON STRL SZ 6.5 (GLOVE) ×5 IMPLANT
GOWN STRL REUS W/ TWL LRG LVL3 (GOWN DISPOSABLE) ×2 IMPLANT
GOWN STRL REUS W/TWL LRG LVL3 (GOWN DISPOSABLE) ×3
GUIDEWIRE STR DUAL SENSOR (WIRE) ×2 IMPLANT
KIT TURNOVER CYSTO (KITS) ×2 IMPLANT
PACK CYSTO AR (MISCELLANEOUS) ×2 IMPLANT
SET CYSTO W/LG BORE CLAMP LF (SET/KITS/TRAYS/PACK) ×2 IMPLANT
SOL .9 NS 3000ML IRR  AL (IV SOLUTION) ×1
SOL .9 NS 3000ML IRR UROMATIC (IV SOLUTION) ×1 IMPLANT
STENT URET 6FRX24 CONTOUR (STENTS) IMPLANT
STENT URET 6FRX26 CONTOUR (STENTS) IMPLANT
STENT URO INLAY 6FRX24CM (STENTS) ×1 IMPLANT
SURGILUBE 2OZ TUBE FLIPTOP (MISCELLANEOUS) ×2 IMPLANT
SYR 10ML LL (SYRINGE) ×1 IMPLANT
SYRINGE IRR TOOMEY STRL 70CC (SYRINGE) ×2 IMPLANT
WATER STERILE IRR 1000ML POUR (IV SOLUTION) ×2 IMPLANT

## 2020-03-29 NOTE — Progress Notes (Signed)
Patient's IV site was infiltrated and bleeding so I removed the IV and applied pressure to the site and bandaged it and took the patient to the bathroom and when we got back to her room her IV site was bleeding again and it started bruising really bad therefore, I called the anesthesiologist Dr. Lubertha Basque and she came over to check the patient. She applied pressure and ordered some labs to make sure she is doing okay. If labs are okay and bleeding stopped she can go home.

## 2020-03-29 NOTE — Transfer of Care (Signed)
Immediate Anesthesia Transfer of Care Note  Patient: [name]  Procedure(s) Performed: CYSTOSCOPY WITH STENT PLACEMENT (Right )  Patient Location: PACU  Anesthesia Type:General  Level of Consciousness: awake and alert   Airway & Oxygen Therapy: Patient Spontanous Breathing and Patient connected to nasal cannula oxygen  Post-op Assessment: Report given to RN and Post -op Vital signs reviewed and stable  Post vital signs: Reviewed and stable  Last Vitals:  Vitals Value Taken Time  BP 171/76 03/29/20 0838  Temp    Pulse 71 03/29/20 0840  Resp 10 03/29/20 0840  SpO2 100 % 03/29/20 0840  Vitals shown include unvalidated device data.  Last Pain:  Vitals:   03/29/20 0617  TempSrc: Temporal  PainSc: 8       Patients Stated Pain Goal: 2 ([id number])  Complications: No apparent anesthesia complications

## 2020-03-29 NOTE — Progress Notes (Signed)
Patient's labs were normal Dr. Ronelle Nigh stated to let her go home and follow up with PCP.

## 2020-03-29 NOTE — Op Note (Signed)
Date of procedure: 03/29/20  Preoperative diagnosis:  1. Follicular lymphoma 2. Right hydronephrosis 3. Acute kidney injury/renal insufficiency  Postoperative diagnosis:  1. Same as above  Procedure: 1. Cystoscopy 2. Right retrograde pyelogram 3. Right ureteral stent placement  Surgeon: Hollice Espy, MD  Anesthesia: General  Complications: None  Intraoperative findings: High-grade ureteral obstruction near the level of the iliac crest with proximal severe hydroureteronephrosis and tortuosity of the proximal mid ureter to this level.  Difficult stent placement.  EBL: Minimal  Specimens: None  Drains: 6 x 24 French double-J ureteral stent on right, Bard Optima  Indication: [name] is a 86 y.o. patient with worsening renal function found to have new right/worsening hydroureteronephrosis and worsening renal function.  After reviewing the management options for treatment, she elected to proceed with the above surgical procedure(s). We have discussed the potential benefits and risks of the procedure, side effects of the proposed treatment, the likelihood of the patient achieving the goals of the procedure, and any potential problems that might occur during the procedure or recuperation. Informed consent has been obtained.  Description of procedure:  The patient was taken to the operating room and general anesthesia was induced.  The patient was placed in the dorsal lithotomy position, prepped and draped in the usual sterile fashion, and preoperative antibiotics were administered. A preoperative time-out was performed.   A 21 French scope was advanced per urethra into the bladder.  Attention was turned to the right ureteral orifice was cannulated using an open-ended ureteral catheter.  A gentle retrograde pyelogram was performed which revealed a decompressed distal ureter but the level of the iliacs, the ureter became hydronephrotic and torturous at this transition point  with severe hydronephrosis appreciated.  I advanced first a sensor wire up to this level but the wire kept coiling would not progress beyond this.  I advanced the open-ended just proximal to this transition point to help try to manipulate the stone.  I also injected the slurry of lidocaine solution mixed with contrast to help facilitate the wire but ultimately was unsuccessful in this effort.  I then tried to use a Glidewire, angled in order to access the ureter.  Ultimately after a significant amount of maneuvering, I was able to get the wire all the way up to the level of the renal pelvis where the wire coiled.  The open-ended 5 French catheter was then advanced into the renal pelvis with some difficulty.  The wire was then exchanged for sensor wire and the open-ended was removed leaving the wire in place.  With some difficulty, I was able to advance a 6 x 24 French Bard Optima ureteral stent to the level of the renal pelvis where a focal was made upon partially removing the wire.  This is a very very tight squeeze to get the stent up again suggestive of very high-grade obstruction.  The wire was then fully withdrawn and full coils noted both within the kidney as well as the renal pelvis.  There was a brisk hydronephrotic flow appreciated from the stent upon placement.  The bladder was then drained.  The patient was then cleaned and dried, repositioned supine position, reversed myesthesia, and taken to the PACU in stable condition.  Plan: She will follow up next week for repeat BMP and renal ultrasound to assess stent function.  Given the high-grade obstruction, it is possible that the stent may not function optimally.  Hollice Espy, M.D.

## 2020-03-29 NOTE — Anesthesia Preprocedure Evaluation (Signed)
Anesthesia Evaluation  Patient identified by MRN, date of birth, ID band Patient awake    Reviewed: Allergy & Precautions, H&P , NPO status , Patient's Chart, lab work & pertinent test results  History of Anesthesia Complications (+) PONV and history of anesthetic complications  Airway Mallampati: II  TM Distance: >3 FB     Dental  (+) Chipped   Pulmonary neg pulmonary ROS,    breath sounds clear to auscultation       Cardiovascular Exercise Tolerance: Good hypertension,  Rhythm:regular Rate:Normal     Neuro/Psych  Headaches, CVA (balance problems, vision and hearing issues)    GI/Hepatic GERD  Controlled,  Endo/Other    Renal/GU Renal disease  negative genitourinary   Musculoskeletal   Abdominal   Peds  Hematology  (+) Blood dyscrasia, anemia ,   Anesthesia Other Findings Past Medical History: No date: Arthritis     Comment:  osteoporosis also. prolia treatments every 6 months 03/2020: Baker cyst, right 2013: Cancer ([place])     Comment:  Non-Hodgkin Lymphoma with chemo and rad tx No date: Complication of anesthesia No date: Dehydration     Comment:  low sodium levels No date: Dysrhythmia No date: Essential tremor 08/06/2002: Follicular lymphoma grade I (HCC) No date: GERD (gastroesophageal reflux disease) No date: Hearing loss of both ears     Comment:  wears bilateral hearing aides 08/13/2015: Hemolytic uremic syndrome (HCC)     Comment:  (E coli 0157) No date: History of blood transfusion No date: History of chemotherapy No date: History of radiation therapy No date: Hypertension No date: Labile blood pressure No date: Meniere's disease     Comment:  with bilateral hearing loss No date: Mini stroke 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 loss  of conciousness while sitting              at table. could not diagnose No date: Syncope  Past Surgical History: No date: APPENDECTOMY 08/09/2002: BONE MARROW BIOPSY 1950s: BREAST LUMPECTOMY     Comment:  benign 03/2009: CATARACT EXTRACTION No date: COLONOSCOPY 09/23/2019: ESOPHAGOGASTRODUODENOSCOPY (EGD) WITH PROPOFOL; N/A     Comment:  Procedure: ESOPHAGOGASTRODUODENOSCOPY (EGD) WITH               PROPOFOL;  Surgeon: Jonathon Bellows, MD;  Location: [facility]               ENDOSCOPY;  Service: Gastroenterology;  Laterality: N/A; No date: FRACTURE SURGERY; Left     Comment:  wrist 08/07/2002: LYMPH NODE BIOPSY     Comment:  cervical No date: TUBAL LIGATION  BMI    Body Mass Index: 18.80 kg/m      Reproductive/Obstetrics (+) Pregnancy                             Anesthesia Physical Anesthesia Plan  ASA: II  Anesthesia Plan: General LMA   Post-op Pain Management:    Induction:   PONV Risk Score and Plan:   Airway Management Planned:   Additional Equipment:   Intra-op Plan:   Post-operative Plan:   Informed Consent: I have reviewed the patients History and Physical, chart, labs and discussed the procedure including the risks, benefits and alternatives for the proposed anesthesia with the patient or authorized representative who has indicated  his/her understanding and acceptance.     Dental Advisory Given  Plan Discussed with: Anesthesiologist  Anesthesia Plan Comments:         Anesthesia Quick Evaluation

## 2020-03-29 NOTE — Interval H&P Note (Signed)
History and Physical Interval Note:  03/29/2020 7:21 AM  [name]  has presented today for surgery, with the diagnosis of right hydronephrosis.  The various methods of treatment have been discussed with the patient and family. After consideration of risks, benefits and other options for treatment, the patient has consented to  Procedure(s): CYSTOSCOPY WITH STENT PLACEMENT (Right) as a surgical intervention.  The patient's history has been reviewed, patient examined, no change in status, stable for surgery.  I have reviewed the patient's chart and labs.  Questions were answered to the patient's satisfaction.    RRR CTAB  Hollice Espy

## 2020-03-29 NOTE — Discharge Instructions (Addendum)
You have a ureteral stent in place.  This is a tube that extends from your kidney to your bladder.  This may cause urinary bleeding, burning with urination, and urinary frequency.  Please call our office or present to the ED if you develop fevers >101 or pain which is not able to be controlled with oral pain medications.  You may be given either Flomax and/ or ditropan to help with bladder spasms and stent pain in addition to pain medications.   ° °[facility] °[address], [address] °[place], [place] [address] °[phone] ° ° ° °AMBULATORY SURGERY  °DISCHARGE INSTRUCTIONS ° ° °1) The drugs that you were given will stay in your system until tomorrow so for the next 24 hours you should not: ° °A) Drive an automobile °B) Make any legal decisions °C) Drink any alcoholic beverage ° ° °2) You may resume regular meals tomorrow.  Today it is better to start with liquids and gradually work up to solid foods. ° °You may eat anything you prefer, but it is better to start with liquids, then soup and crackers, and gradually work up to solid foods. ° ° °3) Please notify your doctor immediately if you have any unusual bleeding, trouble breathing, redness and pain at the surgery site, drainage, fever, or pain not relieved by medication. ° ° ° °4) Additional Instructions: ° ° ° ° ° ° ° °Please contact your physician with any problems or Same Day Surgery at [phone], Monday through Friday 6 am to 4 pm, or [facility] [place] Main number at [phone]. °

## 2020-03-29 NOTE — Anesthesia Procedure Notes (Signed)
Procedure Name: LMA Insertion Performed by: Fredderick Phenix, CRNA Pre-anesthesia Checklist: Patient identified, Emergency Drugs available, Suction available and Patient being monitored Patient Re-evaluated:Patient Re-evaluated prior to induction Oxygen Delivery Method: Circle system utilized Preoxygenation: Pre-oxygenation with 100% oxygen Induction Type: IV induction Ventilation: Mask ventilation without difficulty LMA: LMA inserted and LMA flexible inserted LMA Size: 3.0 Tube type: Oral Number of attempts: 1 Airway Equipment and Method: Oral airway Placement Confirmation: ETT inserted through vocal cords under direct vision,  positive ETCO2 and breath sounds checked- equal and bilateral Tube secured with: Tape Dental Injury: Teeth and Oropharynx as per pre-operative assessment

## 2020-03-31 LAB — CULTURE, URINE COMPREHENSIVE

## 2020-03-31 NOTE — Anesthesia Postprocedure Evaluation (Signed)
Anesthesia Post Note  Patient: [name]  Procedure(s) Performed: CYSTOSCOPY WITH STENT PLACEMENT (Right )  Patient location during evaluation: PACU Anesthesia Type: General Level of consciousness: awake and alert Pain management: pain level controlled Vital Signs Assessment: post-procedure vital signs reviewed and stable Respiratory status: spontaneous breathing, nonlabored ventilation and respiratory function stable Cardiovascular status: blood pressure returned to baseline and stable Postop Assessment: no apparent nausea or vomiting Anesthetic complications: yes Anesthetic complication details: anesthesia complicationsComments:     PIV placed in pre-op in LUE infiltrated on induction.  Second PIV placed in OR in pt's RUE was noted to be infiltrated when pt arrived to Phase II.  After PIV was removed by RN, a large hematoma formed in [place].  Pressure was held and a compression wrap was applied.  Due to the large hematoma formation (and known diagnosis of lymphoma), labs were sent including CBC and coags.  Labs returned WNL.  No further bleeding noted.  Pt deemed stable for discharge.       Last Vitals:  Vitals:   03/29/20 1032 03/29/20 1202  BP: (!) 141/63 (!) 150/65  Pulse: 67 72  Resp: 16 16  Temp:    SpO2: 100% 100%    Last Pain:  Vitals:   03/29/20 1202  TempSrc:   PainSc: 0-No pain                 Brett Canales Julann Mcgilvray

## 2020-04-02 ENCOUNTER — Telehealth: Payer: Self-pay | Admitting: Radiology

## 2020-04-02 ENCOUNTER — Ambulatory Visit: Payer: Medicare Other | Admitting: Hematology and Oncology

## 2020-04-02 NOTE — Telephone Encounter (Signed)
Patient reports increased reflux since general anesthesia on 03/29/2020. She contacted her PCP who referred her back to Dr Erlene Quan. She asks if this is something that will go away or be permanent. She also asks for recommendations to control symptoms.

## 2020-04-02 NOTE — Telephone Encounter (Signed)
Yes hopefully she will improve.  I;d have her reach back out to her primary care physician, I do not really treat reflux but she can always try to take something over-the-counter.  She can also ask Dr. Mike Gip.  Hollice Espy, MD

## 2020-04-02 NOTE — Progress Notes (Signed)
Confirmed Name and DOB. Patient states she has had trouble with swallowing since Cystoscopy procedure (anesthesia). Reports when she swallows it still feels as if "something is still there" and has had increase in acid reflux resulting in decreased appetite. Reports she has reached out to MDs office to make aware.  Denies any other concerns.

## 2020-04-02 NOTE — Telephone Encounter (Signed)
Encouraged patient to reach out to her primary care physician or Dr Mike Gip as Dr Erlene Quan doesn't really treat reflux. She states she has already left a message for her gastroenterologist to get their recommendations. Patient verbalizes understanding.

## 2020-04-02 NOTE — Progress Notes (Signed)
[facility]  [address], [address] [place], [place] [address] Phone: [phone]  Fax: [phone]   Clinic Day:  04/03/2020  Referring physician: Marinda Elk, MD  Chief Complaint: [name] is a 86 y.o. female with  a history of stage IIIA follicular non-Hodgkin's lymphoma who is seen for a 1 week assessment after interval right ureteral stent placement.  HPI: The patient was last seen in the medical oncology clinic on 03/22/2020. At that time, she had abdominal discomfort and right lower extremity edema. Abdomen and pelvis CT on 03/21/2020 revealed right hydroureteronephrosis.  She appeared to have developed retroperitoneal adenopathy although imaging was limited secondary to lack of IV contrast.  There was probable bilateral iliac adenopathy.  Adenopathy appeared to be the cause of the distal right ureteral obstruction.  Creatinine was 1.23 (CrCl 23 ml/min).  She was referral to Dr. Erlene Quan and Dr. Carroll Kinds. She was started on prednisone 30 mg /day x 5 days.   She was seen by Dr Delana Meyer on 03/26/2020. Pain appeared to match the superficial femoral nerve distribution. Findings suggested that her lymphoma was compressing not only the ureter but likely her iliac vein and probably the superficial femoral nerve.  Right pelvic venogram and possible stent to the iliac vein was discussed.  She was considering intervention.   She was seen by Dr Hollice Espy on 03/27/2020. She underwent cystoscopy, right retrograde pyelogram, and right ureteral stent placement on 03/29/2020.  Findings revealed high grade ureteral obstruction near the level of the iliac crest with proximal severe hydroureteronephrosis and tortuosity of the proximal mid ureter to this level.  During the interim, she has felt "not good". She feels weak. Since 03/31/202, she has had increased pain in her legs, abdomen and throat (7/10).  Throat symptoms began after her procedure.  Anaesthesia  notes indicate oral intubation.  She notes thick saliva in her throat. She reports some dysphagia. She has occasional hiccups.   She notes difficulty swallowing.  This morning she ate a small waffle and took a few sips of coffee. She notes pain after eating; food feels like it is getting stuck. She is drinking 1 Ensure Max a day. I encouraged her to drink 3 Ensures per day. The patient reports only eating about 1 cup of food a day and drinking 1 quart of fluid a day. Her husband reports she is drinking plenty fluids but she it not eating well. She denies any nausea or vomiting.  She feels short of breath and can only take short breaths.  She notes insomnia x 2 weeks secondary to abdominal pain. She has only had 2 good nights sleep. Last night she had difficulty finding a position to sleep in. She denies any hematuria or dysuria. She notes the prednisone pulse last week helped decrease the swelling in her right leg and foot.  After stopping prednisone,  the swelling and pain returned.  She is taking Tylenol for pain.  She is interested in something stronger.   Her husband notes this is one of the most cleared minded days she has had in a while.   She has a follow-up renal ultrasound scheduled on 04/06/2020.     Past Medical History:  Diagnosis Date  . Arthritis    osteoporosis also. prolia treatments every 6 months  . Baker cyst, right 03/2020  . Cancer [facility]) 2013   Non-Hodgkin Lymphoma with chemo and rad tx  . Complication of anesthesia   . Dehydration  low sodium levels  . Dysrhythmia   . Essential tremor   . Follicular lymphoma grade I ([place]) 08/06/2002  . GERD (gastroesophageal reflux disease)   . Hearing loss of both ears    wears bilateral hearing aides  . 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 History of blood transfusion   . History of chemotherapy   . History of radiation therapy   . Hypertension   . Labile blood pressure   . Meniere's disease    with  bilateral hearing loss  . Mini stroke [facility])    summer 2014  . Osteoporosis   . PONV (postoperative nausea and vomiting)   . Renal artery aneurysm ([place])   . Stroke [facility]) 2015   possible mini stroke. loss of conciousness while sitting at table. could not diagnose  . Syncope     Past Surgical History:  Procedure Laterality Date  . APPENDECTOMY    . BONE MARROW BIOPSY  08/09/2002  . BREAST LUMPECTOMY  1950s   benign  . CATARACT EXTRACTION  03/2009  . COLONOSCOPY    . CYSTOSCOPY WITH STENT PLACEMENT Right 03/29/2020   Procedure: CYSTOSCOPY WITH STENT PLACEMENT;  Surgeon: Hollice Espy, MD;  Location: [facility] ORS;  Service: Urology;  Laterality: Right;  . ESOPHAGOGASTRODUODENOSCOPY (EGD) WITH PROPOFOL N/A 09/23/2019   Procedure: ESOPHAGOGASTRODUODENOSCOPY (EGD) WITH PROPOFOL;  Surgeon: Jonathon Bellows, MD;  Location: [facility] ENDOSCOPY;  Service: Gastroenterology;  Laterality: N/A;  . FRACTURE SURGERY Left    wrist  . LYMPH NODE BIOPSY  08/07/2002   cervical  . TUBAL LIGATION      Family History  Problem Relation Age of Onset  . Cancer Father        Throat  . Cancer Sister 25       Colon    Social History:  reports that she has never smoked. She has never used smokeless tobacco. She reports that she does not drink alcohol or use drugs. She lives in [place].She and her husband are getting restless staying at home during the pandemic. The patient is accompanied by her husband today.  Allergies:  Allergies  Allergen Reactions  . Lisinopril Cough  . Primidone Other (See Comments)    Makes her feel "loopy and unsteady" Also, did not help the tremors    Current Medications: Current Outpatient Medications  Medication Sig Dispense Refill  . Acetaminophen (TYLENOL ARTHRITIS EXT RELIEF PO) Take 1 tablet by mouth daily as needed (pain).    . AZELASTINE & FLUTICASONE NA 1 spray each nase twice daily.    . calcium-vitamin D (CALCIUM 500/D) 500-200 MG-UNIT tablet Take 1 tablet by mouth 2  (two) times daily.     [name] denosumab (PROLIA) 60 MG/ML SOSY injection Inject 60 mg into the skin every 6 (six) months.     . DEXILANT 60 MG capsule TAKE ONE CAPSULE BY MOUTH ONE TIME DAILY  90 capsule 1  . Flax Oil-Fish Oil-Borage Oil (FISH OIL-FLAX OIL-BORAGE OIL) CAPS Take 1,000-1,400 mg by mouth daily.    . Glucosamine HCl-MSM (GLUCOSAMINE-MSM PO) Take 1 tablet by mouth daily.     . Magnesium Citrate 125 MG CAPS Take 1 tablet by mouth daily.     . melatonin 5 MG TABS Take 5 mg by mouth at bedtime as needed.     . Multiple Vitamin (MULTIVITAMIN) tablet Take 1 tablet by mouth daily.    . Multiple Vitamins-Minerals (PRESERVISION AREDS 2 PO) Take 1 tablet by mouth daily.     [name]  naproxen sodium (ALEVE) 220 MG tablet Take 220 mg by mouth daily as needed.     [name] oxybutynin (DITROPAN) 5 MG tablet Take 1 tablet (5 mg total) by mouth every 8 (eight) hours as needed for bladder spasms. 30 tablet 0  . polyethylene glycol (MIRALAX / GLYCOLAX) 17 g packet Take 17 g by mouth daily.    . Probiotic Product (PROBIOTIC DAILY PO) Take 2 tablets by mouth daily.     . tamsulosin (FLOMAX) 0.4 MG CAPS capsule Take 1 capsule (0.4 mg total) by mouth daily. 30 capsule 0  . triamcinolone cream (KENALOG) 0.1 % Apply 1 application topically as needed.     . Turmeric Curcumin 500 MG CAPS Take 1 capsule by mouth daily.      No current facility-administered medications for this visit.    Review of Systems  Constitutional: Negative for chills, diaphoresis, fever, malaise/fatigue and weight loss (stable).       "Not good".  HENT: Positive for sore throat (with associated dysphagia (7/10); after eating; s/p recent surgery). Negative for congestion, ear discharge, ear pain, hearing loss, nosebleeds and sinus pain.   Eyes: Negative for blurred vision.  Respiratory: Positive for sputum production (thick saliva; s/p surgery) and shortness of breath (difficulty taking deep breaths since surgery). Negative for cough and hemoptysis.     Cardiovascular: Positive for leg swelling (right leg and foot). Negative for chest pain, palpitations and orthopnea.  Gastrointestinal: Positive for abdominal pain. Negative for blood in stool, constipation (takes MiraLax), diarrhea, heartburn, melena, nausea and vomiting.       Eating about 1 cup of food. Drinking 1 quart of fluid daily. Drinking 1 ensure Max a day. Occasional hiccups.  Genitourinary: Negative.  Negative for dysuria, frequency, hematuria and urgency.  Musculoskeletal: Positive for joint pain (knee pain; RLE) and myalgias (bilateral legs). Negative for back pain and neck pain.  Skin: Negative for itching and rash.  Neurological: Positive for tremors (essential) and weakness. Negative for dizziness and headaches.  Endo/Heme/Allergies: Does not bruise/bleed easily.  Psychiatric/Behavioral: Negative for depression and memory loss. The patient has insomnia (related to abdominal discomfort x 2 weeks). The patient is not nervous/anxious.   All other systems reviewed and are negative.  Performance status (ECOG): 2  Vitals Blood pressure (!) 166/65, pulse 77, temperature (!) 97.1 F (36.2 C), temperature source Tympanic, resp. rate 18, height 5' 5"  (1.651 m), weight 118 lb 9.7 oz (53.8 kg), SpO2 100 %.   Physical Exam  Constitutional: She is oriented to person, place, and time.  Thin slightly frail appearing woman sitting in clinic in no acute distress.  HENT:  Head: Normocephalic and atraumatic.  Mouth/Throat: Oropharynx is clear and moist. No oropharyngeal exudate.  Curly gray hair. Mask.  Eyes: Pupils are equal, round, and reactive to light. Conjunctivae and EOM are normal. No scleral icterus.  Gold rimmed glasses. Blue eyes.  Neck: No JVD present.  Cardiovascular: Normal rate, regular rhythm and normal heart sounds. Exam reveals no gallop.  No murmur heard. Pulmonary/Chest: Effort normal and breath sounds normal. No respiratory distress. She has no wheezes. She has no  rales. She exhibits no tenderness.  Abdominal: Soft. Bowel sounds are normal. She exhibits no distension and no mass. There is abdominal tenderness. There is no rebound and no guarding.  Musculoskeletal:        General: Tenderness (right lower extremity) and edema (1-2+ right lower extremity-stable) present. Normal range of motion.     Cervical back: Normal range of motion  and neck supple.  Lymphadenopathy:       Head (right side): No preauricular, no posterior auricular and no occipital adenopathy present.       Head (left side): No preauricular, no posterior auricular and no occipital adenopathy present.    She has no cervical adenopathy.    She has no axillary adenopathy.       Right: No inguinal and no supraclavicular adenopathy present.       Left: No inguinal and no supraclavicular adenopathy present.  Neurological: She is alert and oriented to person, place, and time.  Skin: Skin is warm and dry. No rash noted. She is not diaphoretic. No erythema. No pallor.  Psychiatric: She has a normal mood and affect. Her behavior is normal. Judgment and thought content normal.  Nursing note and vitals reviewed.   No visits with results within 3 Day(s) from this visit.  Latest known visit with results is:  Admission on 03/29/2020, Discharged on 03/29/2020  Component Date Value Ref Range Status  . WBC 03/29/2020 10.0  4.0 - 10.5 K/uL Final  . RBC 03/29/2020 3.44* 3.87 - 5.11 MIL/uL Final  . Hemoglobin 03/29/2020 11.0* 12.0 - 15.0 g/dL Final  . HCT 03/29/2020 32.2* 36.0 - 46.0 % Final  . MCV 03/29/2020 93.6  80.0 - 100.0 fL Final  . MCH 03/29/2020 32.0  26.0 - 34.0 pg Final  . MCHC 03/29/2020 34.2  30.0 - 36.0 g/dL Final  . RDW 03/29/2020 13.0  11.5 - 15.5 % Final  . Platelets 03/29/2020 283  150 - 400 K/uL Final  . nRBC 03/29/2020 0.0  0.0 - 0.2 % Final   Performed at [facility], [address] [place], [place] [address]  . Fibrinogen 03/29/2020 395  210 - 475 mg/dL Final    Performed at [facility], [address] [place], [place] [address]  . Prothrombin Time 03/29/2020 13.2  11.4 - 15.2 seconds Final  . INR 03/29/2020 1.0  0.8 - 1.2 Final   Comment: (NOTE) INR goal varies based on device and disease states. Performed at [facility], [address] [place], [place] [address]   . aPTT 03/29/2020 26  24 - 36 seconds Final   Performed at [facility], [address] [place], [place] [address]    Assessment:  [name] is a 86 y.o. female with a history of stage IIIA follicular non-Hodgkin's lymphoma. She presented in the summer of 2003 with an epigastric and left upper quadrant mass and left arm weakness with decreased mobility. Abdominal ultrasound revealed a large mass in the head of the pancreas with multiple enlarged lymph nodes and a large lobulated mass in the right retroperitoneal area. MRI of the left brachial plexus revealed a 2.5 cm soft tissue mass and abnormal lymph node along the left brachial plexus and in the supraclavicular region.  Cervical node biopsyon 08/06/2002 revealed a grade I follicular non-Hodgkin's lymphoma. Bone marrowon 08/09/2002 was negative. Lumbar puncture was negative. She receivedLeukeran and Rituxan. As her arm pain and mobility was not relieved, she received local radiationto the left brachial plexus area. Leukeran continued through 02/28/2013 (discontinued secondary side effects).  She developed progressive disease in 10/2003. Patient received CVPchemotherapy beginning 01/02/2004. In 06/2004 she was switched to CNOP. She received 8 cycles completing on 01/15/2005. She began maintenance Rituxanon 04/23/2004 (4 weekly doses every 3 months for 2 years). Therapy completed on 09/13/2008.   Excision of a 1 cm right preauricular masson 10/04/2017 revealed a  low grade follicular lymphoma. CT guided biopsyof the retroperitoneal nodal masson 11/20/2017 revealed grade I follicular  lymphoma. Immunohistochemistry revealed positive for CD10, BCL-2, BCL-6, Ki67 (15-20%) and negative for cyclin D1 and CD5. Flow cytometry revealed a CD10+ clonal B-cell population.   She received 4 weekly cycles of Rituxan(12/24/2017 - 01/14/2018) and 3 cycles of maintenance Rituxan(03/24/2018 - 10/08/2018).  PET scanon 11/08/2018 revealed a 5.5 x 7.3 cm hypermetabolic (SUV 62.0) right retroperitoneal nodal mass and aortocaval lymph node. CT guided biopsyon 35/59/7416 confirmed follicular lymphoma, grade I.  She received radiation(IMRT) of 3000 cGy to the retroperitoneal mass from 01/11/2019 - 01/31/2019.  Abdomen and pelvis CTon 03/11/2019 revealed partial treatment response. Right retroperitoneal nodal mass and aortocaval adenopathy haddecreased in size (7.1 x 5.8 cm to 6.5 x 4.1 cm).There was decreased mass effect on the IVC. There were no new sites of disease.  Abdomen and pelvis CTon 09/12/2019 revealed a significant interval decrease in size of a retroperitoneal mass that measured 4.6 x 2.8 cm (previously 6.5 x 4.1 cm). There was redemonstrated, adjacent post treatment retroperitoneal soft tissue about the inferior vena cava and aorta. Findings were consistent with ongoing treatment response. There was no discretely enlarged lymph nodes in the abdomen or pelvis.  Abdomen and pelvis CT on 03/21/2020 revealed moderate right hydroureteronephrosis which appeared to extend to the L5 level, but no definite obstructing calculus is noted.  The cause of the distal ureteral obstruction is unknown. There was sigmoid diverticulosis without inflammation and several small calcified uterine fibroids.  The following studies were negativeon 10/23/2017: hepatitis B core antibody, hepatitis B surface antigen, hepatitis C antibody. G6PD assay was normal.  She was admitted to [facility] 08/15/2015 - 08/25/2015 with bloody diarrhea then hemolytic uremic syndromerelated to E. coli 0157.  She had a complicated course with acute renal failure and anemia. She was then in rehab for 3 weeks. She continues to work on her balance.  Bone densityon 10/27/2017 revealed osteoporosiswith a T-score of -3.8 in the left forearm radius. Bonedensityon 11/07/2019 revealed osteoporosis with a T score of -2.5 in the right femoral neck.She began every 6 month Proliaon 04/20/2018 (last01/03/2020).  She has right lower extremity edema.  Right lower extremity duplex on 03/14/2020 revealed no evidence of DVT.  She has a 1 month history of abdominal discomfort.  Creatinine has increased.  She had new right sided hydroureteronephrosis.  Abdomen and pelvis CT on 03/21/2020 revealed moderate right hydroureteronephrosis which appeared to extend to the L5 level, but without definite obstructing calculus.  The cause of the distal ureteral obstruction was unknown.  There was sigmoid diverticulosis without inflammation and several small calcified uterine fibroids.  Appears to be interval development of retroperitoneal adenopathy.  Left periaortic lymph node measures 2 cm.  There is a 2.6 cm right paratracheal node.  There is probable bilateral iliac adenopathy.  Adenopathy may be the cause of distal right ureteral obstruction.  She underwent cystoscopy, right retrograde pyelogram, and right ureteral stent placement on 03/29/2020.  Symptomatically, she has had increased difficulties since stent placement.  She notes dysphagia and odynophagia.  She has increased right lower extremity discomfort.  Plan: 1.   Labs today: BMP, hepatitis B core antibody total, hepatitis B surface antigen, hepatitis C antibody, G6PD assay. 2.Stage IIIAfollicular non-Hodgkin's lymphoma Patient has been treated with several regimens in the past:   CVP, CNOP, and Rituxan.   Disease grew on maintenance Rituxan.  Patient is s/p IMRT to the retroperitoneal mass.  Symptomatically, she had had abdominal symptoms  x 1  month.        Abdomen and pelvis CT on 09/12/2019 reveals a significant improvement in the retroperitoneal mass.        Abdominal and pelvic pelvis CT on 03/21/2020 suggests retroperitoneal adenopathy although imaging is limited secondary to lack of IV contrast.                    There is probable bilateral iliac adenopathy.                      Adenopathy appears to be the cause of her distal right ureteral obstruction Review plans for PET scan s/p right ureteral stent placement.       Given recent relief of discomfort with a brief burst of steroids will repeat prednisone 30 mg a day x 5 days.       If imaging suggest recurrent follicular lymphoma (not transformed) discuss consideration of Revlimid and obinutuzimab.   If concern for Richter's phenomenon, would repeat biopsy. 3.   Right hydroureteronephrosis             Adenopathy likely causing obstruction.             Patient is s/p stent placement.  Follow-up renal ultrasound scheduled.  Follow creatinine. 4.   Right lower extremity edema             Etiology likely secondary to obstructive adenopathy in abdomen/pelvis limiting venous return.             Duplex on 03/14/2020 revealed no evidence of DVT.             Patient was seen by Dr Delana Meyer- no intervention currently planned.  Patient notes ongoing pain.   Rx: hydrocodone-acetaminophen 5-370m  1/2-1 tablet every 4 hours as needed for pain.  If any increase in edema, plan to repeat duplex. 5.   Dysphagia  Temporally related to oral intubation.  Suspect some component of edema.  Repeat 5 day prednisone pulse.  Patient on Dexilant.  Contact Dr AVicente Males done. 6.   PET scan ASAP. 7.   RTC after after PET scan for MD assessment, review of imaging, and discussion regarding direction of therapy.  I discussed the assessment and treatment plan with the patient.  The patient was provided an opportunity to ask questions and all were answered.  The patient agreed with the plan  and demonstrated an understanding of the instructions.  The patient was advised to call back if the symptoms worsen or if the condition fails to improve as anticipated.  I provided 35 minutes of face-to-face time during this this encounter and > 50% was spent counseling as documented under my assessment and plan. An additional 10 minutes were spent reviewing her chart (Epic and Care Everywhere) including notes, labs, and imaging studies.   MLequita Asal MD, PhD    04/03/2020, 9:10 AM  I, ASelena Batten am acting as scribe for [employer] CMike Gip MD, PhD.  I, Melanye Hiraldo C. CMike Gip MD, have reviewed the above documentation for accuracy and completeness, and I agree with the above.

## 2020-04-03 ENCOUNTER — Telehealth: Payer: Self-pay

## 2020-04-03 ENCOUNTER — Inpatient Hospital Stay (HOSPITAL_BASED_OUTPATIENT_CLINIC_OR_DEPARTMENT_OTHER): Payer: Medicare Other | Admitting: Hematology and Oncology

## 2020-04-03 ENCOUNTER — Other Ambulatory Visit: Payer: Self-pay | Admitting: Hematology and Oncology

## 2020-04-03 ENCOUNTER — Encounter: Payer: Self-pay | Admitting: Hematology and Oncology

## 2020-04-03 ENCOUNTER — Other Ambulatory Visit: Payer: Self-pay

## 2020-04-03 VITALS — BP 166/65 | HR 77 | Temp 97.1°F | Resp 18 | Ht 65.0 in | Wt 118.6 lb

## 2020-04-03 DIAGNOSIS — C8200 Follicular lymphoma grade I, unspecified site: Secondary | ICD-10-CM

## 2020-04-03 DIAGNOSIS — Z7189 Other specified counseling: Secondary | ICD-10-CM

## 2020-04-03 DIAGNOSIS — N289 Disorder of kidney and ureter, unspecified: Secondary | ICD-10-CM

## 2020-04-03 DIAGNOSIS — N1330 Unspecified hydronephrosis: Secondary | ICD-10-CM

## 2020-04-03 DIAGNOSIS — C8298 Follicular lymphoma, unspecified, lymph nodes of multiple sites: Secondary | ICD-10-CM | POA: Diagnosis not present

## 2020-04-03 DIAGNOSIS — G893 Neoplasm related pain (acute) (chronic): Secondary | ICD-10-CM | POA: Diagnosis not present

## 2020-04-03 LAB — HEPATITIS C ANTIBODY: HCV Ab: NONREACTIVE

## 2020-04-03 LAB — BASIC METABOLIC PANEL
Anion gap: 10 (ref 5–15)
BUN: 22 mg/dL (ref 8–23)
CO2: 25 mmol/L (ref 22–32)
Calcium: 9 mg/dL (ref 8.9–10.3)
Chloride: 95 mmol/L — ABNORMAL LOW (ref 98–111)
Creatinine, Ser: 1.12 mg/dL — ABNORMAL HIGH (ref 0.44–1.00)
GFR calc Af Amer: 52 mL/min — ABNORMAL LOW (ref >60)
GFR calc non Af Amer: 45 mL/min — ABNORMAL LOW (ref >60)
Glucose, Bld: 98 mg/dL (ref 70–99)
Potassium: 4 mmol/L (ref 3.5–5.1)
Sodium: 130 mmol/L — ABNORMAL LOW (ref 135–145)

## 2020-04-03 LAB — HEPATITIS B CORE ANTIBODY, TOTAL: Hep B Core Total Ab: NONREACTIVE

## 2020-04-03 LAB — HEPATITIS B SURFACE ANTIGEN: Hepatitis B Surface Ag: NONREACTIVE

## 2020-04-03 MED ORDER — HYDROCODONE-ACETAMINOPHEN 5-325 MG PO TABS: ORAL_TABLET | ORAL | 0 refills | Status: DC

## 2020-04-03 MED ORDER — PREDNISONE 10 MG PO TABS: 30.0000 mg | ORAL_TABLET | Freq: Every day | ORAL | 0 refills | Status: DC

## 2020-04-03 NOTE — Progress Notes (Signed)
The patient also reports she is only eating about 1 cup of food a day and drink 1 quart of fluids a day. The patient is very frail and shaky with exam today. The patient also reports increase pain to bilateral legs, abdomen and her throat ( pain level today 7) started 03/14/2020.

## 2020-04-03 NOTE — Telephone Encounter (Signed)
Called [name] to offer an appointment with Dr. Vicente Males for evaluation of trouble swallowing. Per Dr. Vicente Males, [name] needs an appointment this week.  Unable to contact, LVM to return call

## 2020-04-03 NOTE — Telephone Encounter (Signed)
Left a message on the patient and her husband voicel mail to inform both that Dr Mike Gip has spoken with Dr Vicente Males and she is willing to do a virtual visit by the end of this week. I also informed them to be on the look out for a phone call.

## 2020-04-04 ENCOUNTER — Ambulatory Visit (INDEPENDENT_AMBULATORY_CARE_PROVIDER_SITE_OTHER): Payer: Medicare Other | Admitting: Gastroenterology

## 2020-04-04 ENCOUNTER — Other Ambulatory Visit: Payer: Self-pay

## 2020-04-04 VITALS — BP 121/76 | HR 84 | Temp 98.3°F | Ht 65.0 in | Wt 120.4 lb

## 2020-04-04 DIAGNOSIS — R1310 Dysphagia, unspecified: Secondary | ICD-10-CM | POA: Diagnosis not present

## 2020-04-04 LAB — GLUCOSE 6 PHOSPHATE DEHYDROGENASE
G6PDH: 10.2 U/g{Hb} (ref 4.8–15.7)
Hemoglobin: 11.2 g/dL (ref 11.1–15.9)

## 2020-04-04 MED ORDER — MAGIC MOUTHWASH W/LIDOCAINE: 10.0000 mL | Freq: Three times a day (TID) | ORAL | 0 refills | Status: AC

## 2020-04-04 NOTE — Progress Notes (Signed)
Jonathon Bellows MD, MRCP(U.K) [address]  [place]  [place], [place] [address]  Main: [phone]  Fax: [phone]   Primary Care Physician: Marinda Elk, MD  Primary Gastroenterologist:  Dr. Jonathon Bellows    Difficulty swallowing  HPI: [name] is a 86 y.o. female  Summary of history :    She was initially seen and referred on 09/12/2019 for GERD and esophagitis.  She has a history of non-Hodgkin's lymphoma status post radiation.  She has had issues with acid reflux since 2003 which was initially treated with Zantac and then changed to Prilosec which stopped working subsequently.  At her initial visit she was taking Gaviscon, Pepcid, Prilosec and still having symptoms.  Her symptoms were tightness when she swallows solids or liquids but did not affect water.She felt that since commencing on the Dexilant 60 mg a day she has had no symptoms.  Prior to obtaining the [place] she says she went back on omeprazole and the symptoms were present on and off.  The price of Dexilant which is over $500 for a 90-day supply made it hard to afford.  09/23/2019: EGD normal: Biopsies of the esophagus showed no significant pathology.   Interval history   10/04/2019-04/04/2020  I was contacted by Dr. Mike Gip yesterday that the patient had issues swallowing.  Ongoing since cystoscopy when she had an LMA inserted . Prior to surgery no issues with swallowing all had resolved with Dexilant . She has dysphagia and odynophagia, sharp.    Current Outpatient Medications  Medication Sig Dispense Refill  . Acetaminophen (TYLENOL ARTHRITIS EXT RELIEF PO) Take 1 tablet by mouth daily as needed (pain).    . AZELASTINE & FLUTICASONE NA 1 spray each nase twice daily.    . calcium-vitamin D (CALCIUM 500/D) 500-200 MG-UNIT tablet Take 1 tablet by mouth 2 (two) times daily.     [name] denosumab (PROLIA) 60 MG/ML SOSY injection Inject 60 mg into the skin every 6 (six) months.     . DEXILANT 60  MG capsule TAKE ONE CAPSULE BY MOUTH ONE TIME DAILY  90 capsule 1  . Flax Oil-Fish Oil-Borage Oil (FISH OIL-FLAX OIL-BORAGE OIL) CAPS Take 1,000-1,400 mg by mouth daily.    . Glucosamine HCl-MSM (GLUCOSAMINE-MSM PO) Take 1 tablet by mouth daily.     [name] HYDROcodone-acetaminophen (NORCO) 5-325 MG tablet 1/2 to 1 tablet every 4 hours as needed for pain. 30 tablet 0  . Magnesium Citrate 125 MG CAPS Take 1 tablet by mouth daily.     . melatonin 5 MG TABS Take 5 mg by mouth at bedtime as needed.     . Multiple Vitamin (MULTIVITAMIN) tablet Take 1 tablet by mouth daily.    . Multiple Vitamins-Minerals (PRESERVISION AREDS 2 PO) Take 1 tablet by mouth daily.     . naproxen sodium (ALEVE) 220 MG tablet Take 220 mg by mouth daily as needed.     [name] oxybutynin (DITROPAN) 5 MG tablet Take 1 tablet (5 mg total) by mouth every 8 (eight) hours as needed for bladder spasms. 30 tablet 0  . polyethylene glycol (MIRALAX / GLYCOLAX) 17 g packet Take 17 g by mouth daily.    . predniSONE (DELTASONE) 10 MG tablet Take 3 tablets (30 mg total) by mouth daily with breakfast. 30 tablet 0  . Probiotic Product (PROBIOTIC DAILY PO) Take 2 tablets by mouth daily.     . tamsulosin (FLOMAX) 0.4 MG CAPS capsule Take 1 capsule (0.4 mg total) by mouth  daily. 30 capsule 0  . triamcinolone cream (KENALOG) 0.1 % Apply 1 application topically as needed.     . Turmeric Curcumin 500 MG CAPS Take 1 capsule by mouth daily.      No current facility-administered medications for this visit.    Allergies as of 04/04/2020 - Review Complete 04/03/2020  Allergen Reaction Noted  . Lisinopril Cough 09/26/2015  . Primidone Other (See Comments) 03/27/2020    ROS:  General: Negative for anorexia, weight loss, fever, chills, fatigue, weakness. ENT: Negative for hoarseness, difficulty swallowing , nasal congestion. CV: Negative for chest pain, angina, palpitations, dyspnea on exertion, peripheral edema.  Respiratory: Negative for dyspnea at rest,  dyspnea on exertion, cough, sputum, wheezing.  GI: See history of present illness. GU:  Negative for dysuria, hematuria, urinary incontinence, urinary frequency, nocturnal urination.  Endo: Negative for unusual weight change.    Physical Examination:   BP 121/76   Pulse 84   Temp 98.3 F (36.8 C)   Ht 5\' 5"  (1.651 m)   Wt 120 lb 6.4 oz (54.6 kg)   BMI 20.04 kg/m   General: Well-nourished, well-developed in no acute distress.  Eyes: No icterus. Conjunctivae pink. Mouth: Oropharyngeal mucosa moist and pink , no lesions erythema or exudate. Neuro: Alert and oriented x 3.  Grossly intact. Psych: Alert and cooperative, normal mood and affect.   Imaging Studies: CT ABDOMEN PELVIS WO CONTRAST  Addendum Date: 03/22/2020   ADDENDUM REPORT: 03/22/2020 08:56 ADDENDUM: Upon further review, there does appear to be the interval development of retroperitoneal adenopathy, although evaluation is limited due the lack of intravenous contrast. Left periaortic lymph node measures 2 cm. 2.6 cm right paratracheal lymph node is noted. There is probable bilateral iliac adenopathy is well, although evaluation is limited due to the lack of intravenous contrast. As noted in the impression, CT scan with intravenous and oral contrast is recommended for further evaluation if the patient can tolerate intravenous contrast. This adenopathy may be the cause of distal right ureteral obstruction. Electronically Signed   By: Marijo Conception M.D.   On: 03/22/2020 08:56   Result Date: 03/22/2020 CLINICAL DATA:  Right-sided abdominal pain. EXAM: CT ABDOMEN AND PELVIS WITHOUT CONTRAST TECHNIQUE: Multidetector CT imaging of the abdomen and pelvis was performed following the standard protocol without IV contrast. COMPARISON:  September 12, 2019. FINDINGS: Lower chest: No acute abnormality. Hepatobiliary: No focal liver abnormality is seen. No gallstones, gallbladder wall thickening, or biliary dilatation. Pancreas: Unremarkable. No  pancreatic ductal dilatation or surrounding inflammatory changes. Spleen: Calcified splenic granulomata are noted. Adrenals/Urinary Tract: Adrenal glands appear normal. Left kidney and ureter are unremarkable. Moderate right hydroureteronephrosis is noted which appears to extend to the L5 level, but no definite obstructing calculus is noted. Urinary bladder is decompressed. Stomach/Bowel: Stomach is unremarkable. There is no evidence of bowel obstruction or inflammation. Stool is seen throughout the colon. Sigmoid diverticulosis is noted without inflammation. The appendix is not clearly visualized, but no inflammation is noted in the right lower quadrant. Vascular/Lymphatic: Aortic atherosclerosis. No enlarged abdominal or pelvic lymph nodes. Reproductive: Several small calcified uterine fibroids may be present. No definite adnexal abnormality is noted. Other: No abdominal wall hernia or abnormality. No abdominopelvic ascites. Musculoskeletal: No acute or significant osseous findings. IMPRESSION: 1. Moderate right hydroureteronephrosis is noted which appears to extend to the L5 level, but no definite obstructing calculus is noted. Cause of distal ureteral obstruction is unknown. CT scan with intravenous contrast may be performed for further evaluation. 2.  Sigmoid diverticulosis without inflammation. 3. Several small calcified uterine fibroids. Aortic Atherosclerosis (ICD10-I70.0). Electronically Signed: By: Marijo Conception M.D. On: 03/21/2020 15:51   US Venous Img Lower Unilateral Right (DVT)  Result Date: 03/14/2020 CLINICAL DATA:  RIGHT leg swelling for 1 week question deep venous thrombosis EXAM: RIGHT LOWER EXTREMITY VENOUS DOPPLER ULTRASOUND TECHNIQUE: Gray-scale sonography with compression, as well as color and duplex ultrasound, were performed to evaluate the deep venous system(s) from the level of the common femoral vein through the popliteal and proximal calf veins. COMPARISON:  None FINDINGS: VENOUS  Normal compressibility of the common femoral, superficial femoral, and popliteal veins, as well as the visualized calf veins. Visualized portions of profunda femoral vein and great saphenous vein unremarkable. No filling defects to suggest DVT on grayscale or color Doppler imaging. Doppler waveforms show normal direction of venous flow, normal respiratory phasicity and response to augmentation. Limited views of the contralateral common femoral vein are unremarkable. OTHER Incidentally noted is presence of a Baker cyst at the RIGHT popliteal fossa, 4.1 x 1.0 x 3.4 cm in size. Limitations: none IMPRESSION: No femoropopliteal DVT nor evidence of DVT within the visualized calf veins. Baker cyst RIGHT popliteal fossa. If clinical symptoms are inconsistent or if there are persistent or worsening symptoms, further imaging (possibly involving the iliac veins) may be warranted. Electronically Signed   By: Lavonia Dana M.D.   On: 03/14/2020 15:56   DG C-Arm 1-60 Min-No Report  Result Date: 03/29/2020 Fluoroscopy was utilized by the requesting physician.  No radiographic interpretation.    Assessment and Plan:   DENNY MCDOWALL is a 86 y.o. y/o female with a prior history of GERD and dysphagia which had resolved with the [place] but had failed to respond adequately with Prilosec or H2 receptor blocker.  Endoscopy in October 2020 did not show any luminal obstruction.  She is back to see me today for recurrence of dysphagia and GERD symptoms.Her symnptoms are more of odynophagia along with dysphagia likely related to recent episode of anesthesia and could be trauma from LMA insertion . Expect it to resolve over next 7-10 days , in the interim continue Dexilant and I will prescribe her some Magic mouthwash with lidocaine which contains nystatin, hydrocortisone and diphenhydramine which will help with a dry mouth reduce inflammation and help with local numbing effect.  Oral cavity appeared normal no evidence of any  thrush    Dr Jonathon Bellows  MD,MRCP [facility]) Follow up in 10 days telephone visit

## 2020-04-05 ENCOUNTER — Encounter
Admission: RE | Admit: 2020-04-05 | Discharge: 2020-04-05 | Disposition: A | Discharge status: Home / Self Care | Payer: Medicare Other | Source: Ambulatory Visit | Attending: Hematology and Oncology | Admitting: Hematology and Oncology

## 2020-04-05 ENCOUNTER — Other Ambulatory Visit: Payer: Self-pay

## 2020-04-05 DIAGNOSIS — N1330 Unspecified hydronephrosis: Secondary | ICD-10-CM | POA: Diagnosis not present

## 2020-04-05 DIAGNOSIS — C8200 Follicular lymphoma grade I, unspecified site: Secondary | ICD-10-CM

## 2020-04-05 DIAGNOSIS — Z79899 Other long term (current) drug therapy: Secondary | ICD-10-CM | POA: Diagnosis not present

## 2020-04-05 LAB — GLUCOSE, CAPILLARY: Glucose-Capillary: 100 mg/dL — ABNORMAL HIGH (ref 70–99)

## 2020-04-05 MED ORDER — FLUDEOXYGLUCOSE F - 18 (FDG) INJECTION
6.3200 | Freq: Once | INTRAVENOUS | Status: AC | PRN
  Administered 2020-04-05: 12:00:00 6.32 via INTRAVENOUS

## 2020-04-06 ENCOUNTER — Other Ambulatory Visit: Payer: Self-pay

## 2020-04-06 ENCOUNTER — Ambulatory Visit
Admission: RE | Admit: 2020-04-06 | Discharge: 2020-04-06 | Disposition: A | Discharge status: Home / Self Care | Payer: Medicare Other | Source: Ambulatory Visit | Attending: Urology | Admitting: Urology

## 2020-04-06 ENCOUNTER — Other Ambulatory Visit: Payer: Medicare Other

## 2020-04-06 ENCOUNTER — Other Ambulatory Visit: Payer: Self-pay | Admitting: Hematology and Oncology

## 2020-04-06 DIAGNOSIS — N1330 Unspecified hydronephrosis: Secondary | ICD-10-CM

## 2020-04-07 LAB — BASIC METABOLIC PANEL
BUN/Creatinine Ratio: 24 (ref 12–28)
BUN: 29 mg/dL — ABNORMAL HIGH (ref 8–27)
CO2: 18 mmol/L — ABNORMAL LOW (ref 20–29)
Calcium: 9.8 mg/dL (ref 8.7–10.3)
Chloride: 94 mmol/L — ABNORMAL LOW (ref 96–106)
Creatinine, Ser: 1.23 mg/dL — ABNORMAL HIGH (ref 0.57–1.00)
GFR calc Af Amer: 46 mL/min/{1.73_m2} — ABNORMAL LOW (ref >59)
GFR calc non Af Amer: 40 mL/min/{1.73_m2} — ABNORMAL LOW (ref >59)
Glucose: 134 mg/dL — ABNORMAL HIGH (ref 65–99)
Potassium: 3.9 mmol/L (ref 3.5–5.2)
Sodium: 131 mmol/L — ABNORMAL LOW (ref 134–144)

## 2020-04-08 ENCOUNTER — Telehealth: Payer: Self-pay | Admitting: Hematology and Oncology

## 2020-04-08 NOTE — Telephone Encounter (Signed)
Re:  Update on current symptoms  I called the patient today to discuss follow-up after her meeting with Dr Vicente Males.  She was seen on 04/04/2020 for recurrent dysphagia and GERD after cystoscopy. She appeared to have more odynophagia likely related to endotracheal tube trauma.  She was to continue [place].  Magic mouthwash with lidocaine was prescribed.  On 04/20/202121, she was placed on a 5 day prednisone pulse for her right lower quadrant adenopathy and lower extremity swelling secondary to her lymphoma as well as to decrease any inflammation caused by intubation.  She has had some improvement in her symptoms.  I reminded her to stop the steroids (today is day 6).  She states that the Magic mouthwash initially helped, but does not seem to be helping any more.  She continues to have issues with her swallowing.  She is drinking liquids and eating soft food.  She notes ongoing right lower extremity swelling. She states that her right leg has a spot that is weeping fluid.  Her husband has wrapped some gauze around her leg.  I encouraged her to keep her feet up when sitting.  She denies any redness or fever.  I discussed issues with decreased venous return secondary to the lymphoma.  She is at risk for thrombosis (negative duplex at last visit).  I discussed follow-up in the symptom management clinic tomorrow.  She was agreeable.  I discussed contacting the clinic if any acute worsening of symptoms or concerns.   Lequita Asal, MD

## 2020-04-09 ENCOUNTER — Encounter: Payer: Self-pay | Admitting: Physician Assistant

## 2020-04-09 ENCOUNTER — Telehealth: Payer: Self-pay | Admitting: *Deleted

## 2020-04-09 ENCOUNTER — Other Ambulatory Visit: Payer: Self-pay | Admitting: Radiology

## 2020-04-09 ENCOUNTER — Telehealth: Payer: Self-pay

## 2020-04-09 ENCOUNTER — Other Ambulatory Visit: Payer: Self-pay

## 2020-04-09 ENCOUNTER — Ambulatory Visit (INDEPENDENT_AMBULATORY_CARE_PROVIDER_SITE_OTHER): Payer: Medicare Other | Admitting: Physician Assistant

## 2020-04-09 VITALS — BP 123/75 | HR 80 | Ht 66.0 in | Wt 114.0 lb

## 2020-04-09 DIAGNOSIS — R7989 Other specified abnormal findings of blood chemistry: Secondary | ICD-10-CM

## 2020-04-09 DIAGNOSIS — R1310 Dysphagia, unspecified: Secondary | ICD-10-CM | POA: Insufficient documentation

## 2020-04-09 DIAGNOSIS — N1330 Unspecified hydronephrosis: Secondary | ICD-10-CM | POA: Diagnosis not present

## 2020-04-09 DIAGNOSIS — R600 Localized edema: Secondary | ICD-10-CM | POA: Insufficient documentation

## 2020-04-09 NOTE — Telephone Encounter (Signed)
Spoke with the patient to see if she is having increase swelling to bilateral lower legs.  The patient reports no increase swelling to bilateral lower legs. She does report they are still weeping but she is still  wrapping her legs and keeping them elevated. Which has been keeping the swelling down. She does not report an SOB or fever and don't think she need urgent medical care. The patient say she was ok to wait until tomorrow.  I informed the patient if she develop increase SOB, any trouble breathing or start getting a fever go to the ER. The patient was understanding and agreeable.The patient has appointment in office tomorrow.

## 2020-04-09 NOTE — Progress Notes (Signed)
04/09/2020 4:33 PM   [name] 03-23-1932 [id number]  CC: Hydronephrosis follow-up  HPI: [name] is a 86 y.o. female with PMH stage IIIA follicular non-Hodgkin's lymphoma managed by Dr. Mike Gip who presents today for follow-up of right hydronephrosis. She was first seen by Dr. Erlene Sanchez on 03/27/2020 for evaluation of new right hydronephrosis with AKI. Noncontrast CT revealed likely bilateral iliac adenopathy suspected to be the cause of right ureteral compression. She underwent right ureteral stent placement two days later for renal decompression.  Patient underwent renal US and BMP in the interim with findings of persistent moderate-severe right hydronephrosis and persistently elevated creatinine.  Today, patient reports continued right flank and RLQ discomfort. She describes the pain as throbbing and interfering with sleep. She has been taking Norco for symptom management with minimal improvement. Additionally, patient reports continued throat pain following stent placement and RLE edema.  She is scheduled for her next follow-up appointment with Dr. Mike Gip tomorrow morning.  PMH: Past Medical History:  Diagnosis Date  . Arthritis    osteoporosis also. prolia treatments every 6 months  . Baker cyst, right 03/2020  . Cancer [facility]) 2013   Non-Hodgkin Lymphoma with chemo and rad tx  . Complication of anesthesia   . Dehydration    low sodium levels  . Dysrhythmia   . Essential tremor   . Follicular lymphoma grade I ([place]) 08/06/2002  . GERD (gastroesophageal reflux disease)   . Hearing loss of both ears    wears bilateral hearing aides  . 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 History of blood transfusion   . History of chemotherapy   . History of radiation therapy   . Hypertension   . Labile blood pressure   . Meniere's disease    with bilateral hearing loss  . Mini stroke [facility])    summer 2014  . Osteoporosis   . PONV  (postoperative nausea and vomiting)   . 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 Stroke [facility]) 2015   possible mini stroke. loss of conciousness while sitting at table. could not diagnose  . Syncope     Surgical History: Past Surgical History:  Procedure Laterality Date  . APPENDECTOMY    . BONE MARROW BIOPSY  08/09/2002  . BREAST LUMPECTOMY  1950s   benign  . CATARACT EXTRACTION  03/2009  . COLONOSCOPY    . CYSTOSCOPY WITH STENT PLACEMENT Right 03/29/2020   Procedure: CYSTOSCOPY WITH STENT PLACEMENT;  Surgeon: Hollice Espy, MD;  Location: [facility] ORS;  Service: Urology;  Laterality: Right;  . ESOPHAGOGASTRODUODENOSCOPY (EGD) WITH PROPOFOL N/A 09/23/2019   Procedure: ESOPHAGOGASTRODUODENOSCOPY (EGD) WITH PROPOFOL;  Surgeon: Jonathon Bellows, MD;  Location: [facility] ENDOSCOPY;  Service: Gastroenterology;  Laterality: N/A;  . FRACTURE SURGERY Left    wrist  . LYMPH NODE BIOPSY  08/07/2002   cervical  . TUBAL LIGATION      Home Medications:  Allergies as of 04/09/2020      Reactions   Lisinopril Cough   Primidone Other (See Comments)   Makes her feel "loopy and unsteady" Also, did not help the tremors      Medication List       Accurate as of April 09, 2020  4:33 PM. If you have any questions, ask your nurse or doctor.        AZELASTINE & FLUTICASONE NA 1 spray each nase twice daily.   Calcium 500/D 500-200 MG-UNIT tablet Generic drug: calcium-vitamin D Take 1 tablet  by mouth 2 (two) times daily.   denosumab 60 MG/ML Sosy injection Commonly known as: PROLIA Inject 60 mg into the skin every 6 (six) months.   Dexilant 60 MG capsule Generic drug: dexlansoprazole TAKE ONE CAPSULE BY MOUTH ONE TIME DAILY   Fish Oil-Flax Oil-Borage Oil Caps Take 1,000-1,400 mg by mouth daily.   GLUCOSAMINE-MSM PO Take 1 tablet by mouth daily.   HYDROcodone-acetaminophen 5-325 MG tablet Commonly known as: Norco 1/2 to 1 tablet every 4 hours as needed for pain.   magic mouthwash w/lidocaine  Soln Take 10 mLs by mouth 3 (three) times daily for 7 days.   Magnesium Citrate 125 MG Caps Take 1 tablet by mouth daily.   melatonin 5 MG Tabs Take 5 mg by mouth at bedtime as needed.   multivitamin tablet Take 1 tablet by mouth daily.   naproxen sodium 220 MG tablet Commonly known as: ALEVE Take 220 mg by mouth daily as needed.   oxybutynin 5 MG tablet Commonly known as: DITROPAN Take 1 tablet (5 mg total) by mouth every 8 (eight) hours as needed for bladder spasms.   polyethylene glycol 17 g packet Commonly known as: MIRALAX / GLYCOLAX Take 17 g by mouth daily.   predniSONE 10 MG tablet Commonly known as: DELTASONE Take 3 tablets (30 mg total) by mouth daily with breakfast.   PRESERVISION AREDS 2 PO Take 1 tablet by mouth daily.   PROBIOTIC DAILY PO Take 2 tablets by mouth daily.   tamsulosin 0.4 MG Caps capsule Commonly known as: Flomax Take 1 capsule (0.4 mg total) by mouth daily.   triamcinolone cream 0.1 % Commonly known as: KENALOG Apply 1 application topically as needed.   Turmeric Curcumin 500 MG Caps Take 1 capsule by mouth daily.   TYLENOL ARTHRITIS EXT RELIEF PO Take 1 tablet by mouth daily as needed (pain).       Allergies:  Allergies  Allergen Reactions  . Lisinopril Cough  . Primidone Other (See Comments)    Makes her feel "loopy and unsteady" Also, did not help the tremors    Family History: Family History  Problem Relation Age of Onset  . Cancer Father        Throat  . Cancer Sister 13       Colon    Social History:   reports that she has never smoked. She has never used smokeless tobacco. She reports that she does not drink alcohol or use drugs.  Physical Exam: BP 123/75   Pulse 80   Ht '5\' 6"'$  (1.676 m)   Wt 114 lb (51.7 kg)   BMI 18.40 kg/m   Constitutional:  Alert and oriented, no acute distress, nontoxic appearing HEENT: [name], AT Cardiovascular: No clubbing, cyanosis, or edema Respiratory: Normal respiratory effort,  no increased work of breathing Skin: No rashes, bruises or suspicious lesions Neurologic: Grossly intact, no focal deficits, moving all 4 extremities Psychiatric: Normal mood and affect  Laboratory Data: Results for orders placed or performed in visit on 90/21/11  Basic metabolic panel  Result Value Ref Range   Glucose 134 (H) 65 - 99 mg/dL   BUN 29 (H) 8 - 27 mg/dL   Creatinine, Ser 1.23 (H) 0.57 - 1.00 mg/dL   GFR calc non Af Amer 40 (L) >59 mL/min/1.73   GFR calc Af Amer 46 (L) >59 mL/min/1.73   BUN/Creatinine Ratio 24 12 - 28   Sodium 131 (L) 134 - 144 mmol/L   Potassium 3.9 3.5 - 5.2 mmol/L  Chloride 94 (L) 96 - 106 mmol/L   CO2 18 (L) 20 - 29 mmol/L   Calcium 9.8 8.7 - 10.3 mg/dL   Pertinent Imaging: Results for orders placed during the hospital encounter of 04/06/20  Ultrasound renal complete   Narrative CLINICAL DATA:  Known right hydronephrosis.  EXAM: RENAL / URINARY TRACT ULTRASOUND COMPLETE  COMPARISON:  CT scan March 21, 2020  FINDINGS: Right Kidney:  Renal measurements: 10.1 x 5.2 x 5.6 cm = volume: 152.9 mL. There is cortical thinning. Moderate to severe hydronephrosis is again identified. The nephroureteral stent is identified.  Left Kidney:  Renal measurements: 10 x 3.7 x 4.6 cm = volume: 88 mL. Echogenicity within normal limits. No mass or hydronephrosis visualized.  Bladder:  The nephroureteral stent is seen within the bladder. The bladder is poorly distended.  Other:  None.  IMPRESSION: 1. Nephroureteral stent extending from the region of the right renal pelvis to the bladder. Continued moderate to severe right hydronephrosis and cortical thinning.   Electronically Signed   By: Dorise Bullion III M.D   On: 04/07/2020 20:04    I personally reviewed the images referenced above and note persistent right hydronephrosis.  Assessment & Plan:   1. Hydronephrosis, right 86 year old female with a history of stage IIIA follicular lymphoma  with recent development of right hydronephrosis likely secondary to distal ureteral compression from iliac lymphadenopathy presents for follow-up with persistent hydronephrosis and elevated creatinine despite right ureteral stent placement.  I had a lengthy conversation with the patient and her husband today in which I explained that the right ureteral stent has been unable to overcome the degree of ureteral obstruction and is not decompressing her kidney as desired. I explained that her best treatment option at this point is conversion to right nephrostomy tube by IR with subsequent cysto stent removal in our office. I drew pictures for the patient to illustrate renal anatomy and demonstrate how a nephrostomy tube would bypass the ureteral obstruction and relieve her right hydronephrosis. I explained that I expect renal decompression will relieve her symptoms of right flank and RLQ discomfort. Additionally, I explained that the PCN tube will require routine exchanges coordinated by IR.  Patient is reasonably disappointed but ultimately agreed with right PCN placement. Will place referral to IR today and schedule her for cysto stent removal with Dr. Erlene Sanchez following. Recommend repeat BMP following PCN placement to confirm resolution of AKI.  Return if symptoms worsen or fail to improve.   I spent 35 minutes on the day of the encounter to include pre-visit record review, face-to-face time with the patient, and post-visit ordering of tests.   Sylvia Loop, PA-C  [facility] [address], [place], [place] [address] [phone]

## 2020-04-09 NOTE — Progress Notes (Signed)
[name]  [address] [place], [place] [address] Phone: [phone]  Fax: [phone]   Clinic Day:  04/10/2020  Referring physician: Marinda Elk, MD  Chief Complaint: [name] is a 86 y.o. female with  a history of stage IIIA follicular non-Hodgkin's lymphoma who is seen for review of imaging and discussion regarding direction of therapy.   HPI: The patient was last seen in the medical oncology clinic on 04/03/2020. At that time, she had increased difficulties since stent placement. She noted dysphagia and odynophagia. She had increased right lower extremity discomfort. Patient was placed on repeat prednisone 30 mg x 5 days. We discussed a follow up with Dr. Vicente Males.  PET scan was scheduled.   Labs included a sodium of 130 and a creatinine 1.12.  G6PD was normal.  Hepatitis C antibody, hepatitis B surface antigen and hepatitis B core total antibody were non reactive.   Patient was evaluated for dysphagia by Dr. Vicente Males on 04/04/2020. Symptoms were tightness when swallowing solids or liquids but did not affect water. Since commencing on the Dexilant 60 mg a day she had no symptoms. Prior to surgery, she had no issues with swallowing; all had resolved with Dexilant . She had dysphagia and sharp odynophagia.  Dr. Vicente Males suspected symnptoms were more of odynophagia along with dysphagia likely related to recent episode of anesthesia and possible trauma from LMA (laryngeal mask airway) insertion.  Symptoms were expected to resolve over next 7-10 days. Patient advised to continue Dexilant and use magic mouthwash with lidocaine.   PET scan on 04/05/2020 revealed hypermetabolic prevascular lymph nodes (nodes up to 13 mm; SUV 4.2) new from comparison PET CT 11/08/2018 c/w new lymphoma recurrence (Deauville 5). There was new hypermetabolic periaortic lymphadenopathy (Deauville 5); most intense node was 2.2 cm between IVC and aorta at the level of the  kidneys (SUV 5.7).  There was a similar 1.6 cm left aorta node (SUV 3.7).  Spleen and marrow were normal. There was hydronephrosis of the RIGHT renal collecting system with stent in place.  Renal ultrasound ordered by Dr. Hollice Espy on 04/06/2020 revealed nephroureteral stent extending from the region of the right renal pelvis to the bladder. There was continued moderate to severe right hydronephrosis and cortical thinning.  She noted increase swelling in her right lower leg on 04/09/2020.  She reported some weeping of fluid from her leg; she was wrapping her legs and keeping them elevated. She did not report any SOB or fever.  [name], CMA informed the patient if she developed increase SOB, any trouble breathing or start getting a fever go to the ER.   Patient was seen in urology on 04/10/2020 by Sylvia Loop, PA-C. Patient continued to have right flank and RLQ discomfort. She described the pain as throbbing and interfering with sleep. She had been taking Norco for symptom management with minimal improvement. She reported continued throat pain following stent placement and RLE edema.  Vaillancourt, PA-C explained that the right ureteral stent was unable to overcome the ureteral obstruction and was not decompressing her kidney. She explained that her best treatment option was a right nephrostomy tube by IR with subsequent cystoscopy stent removal in their office. The patient agreed with right PCN placement.   During the interim, she has felt "worse".  She completed prednisone on 04/08/2020.  She feels like food just hangs in her throat. She denies any fevers or sweats. This morning she had a small waffle, yogurt drink  and tea and it felt like the food and drink were stuck in her throat.   She has insomnia. Patient has swelling in right leg. She is wrapping her foot with gauze so her shoes are not soaked from the oozing.  She sees her PCP tomorrow for leg edema. She feels weak all  over. Her husband is concerned about her leg swelling and shortness of breath. He feels like she has fluid around her lungs. She props her head up with a lot of pillows and wakes up in the middle of the night short of breath. She had right flank pain last night. She has abdominal pain and feels gassy. She feels like she can not take deep breaths.  She denies any chest pain but notes some strain.    Past Medical History:  Diagnosis Date  . Arthritis    osteoporosis also. prolia treatments every 6 months  . Baker cyst, right 03/2020  . Cancer [facility]) 2013   Non-Hodgkin Lymphoma with chemo and rad tx  . Complication of anesthesia   . Dehydration    low sodium levels  . Dysrhythmia   . Essential tremor   . Follicular lymphoma grade I ([place]) 08/06/2002  . GERD (gastroesophageal reflux disease)   . Hearing loss of both ears    wears bilateral hearing aides  . 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 History of blood transfusion   . History of chemotherapy   . History of radiation therapy   . Hypertension   . Labile blood pressure   . Meniere's disease    with bilateral hearing loss  . Mini stroke [facility])    summer 2014  . Osteoporosis   . PONV (postoperative nausea and vomiting)   . Renal artery aneurysm ([place])   . Stroke [facility]) 2015   possible mini stroke. loss of conciousness while sitting at table. could not diagnose  . Syncope     Past Surgical History:  Procedure Laterality Date  . APPENDECTOMY    . BONE MARROW BIOPSY  08/09/2002  . BREAST LUMPECTOMY  1950s   benign  . CATARACT EXTRACTION  03/2009  . COLONOSCOPY    . CYSTOSCOPY WITH STENT PLACEMENT Right 03/29/2020   Procedure: CYSTOSCOPY WITH STENT PLACEMENT;  Surgeon: Hollice Espy, MD;  Location: [facility] ORS;  Service: Urology;  Laterality: Right;  . ESOPHAGOGASTRODUODENOSCOPY (EGD) WITH PROPOFOL N/A 09/23/2019   Procedure: ESOPHAGOGASTRODUODENOSCOPY (EGD) WITH PROPOFOL;  Surgeon: Jonathon Bellows, MD;   Location: [facility] ENDOSCOPY;  Service: Gastroenterology;  Laterality: N/A;  . FRACTURE SURGERY Left    wrist  . LYMPH NODE BIOPSY  08/07/2002   cervical  . TUBAL LIGATION      Family History  Problem Relation Age of Onset  . Cancer Father        Throat  . Cancer Sister 81       Colon    Social History:  reports that she has never smoked. She has never used smokeless tobacco. She reports that she does not drink alcohol or use drugs. She lives in [place].She and her husband are getting restless staying at home during the pandemic. The patient is accompanied by her husband today.  Allergies:  Allergies  Allergen Reactions  . Lisinopril Cough  . Primidone Other (See Comments)    Makes her feel "loopy and unsteady" Also, did not help the tremors    Current Medications: Current Outpatient Medications  Medication Sig Dispense Refill  . Acetaminophen (TYLENOL ARTHRITIS  EXT RELIEF PO) Take 1 tablet by mouth daily as needed (pain).    . AZELASTINE & FLUTICASONE NA 1 spray each nase twice daily.    . calcium-vitamin D (CALCIUM 500/D) 500-200 MG-UNIT tablet Take 1 tablet by mouth 2 (two) times daily.     [name] denosumab (PROLIA) 60 MG/ML SOSY injection Inject 60 mg into the skin every 6 (six) months.     . DEXILANT 60 MG capsule TAKE ONE CAPSULE BY MOUTH ONE TIME DAILY  90 capsule 1  . Flax Oil-Fish Oil-Borage Oil (FISH OIL-FLAX OIL-BORAGE OIL) CAPS Take 1,000-1,400 mg by mouth daily.    . Glucosamine HCl-MSM (GLUCOSAMINE-MSM PO) Take 1 tablet by mouth daily.     [name] HYDROcodone-acetaminophen (NORCO) 5-325 MG tablet 1/2 to 1 tablet every 4 hours as needed for pain. 30 tablet 0  . magic mouthwash w/lidocaine SOLN Take 10 mLs by mouth 3 (three) times daily for 7 days. 210 mL 0  . Magnesium Citrate 125 MG CAPS Take 1 tablet by mouth daily.     . melatonin 5 MG TABS Take 5 mg by mouth at bedtime as needed.     . Multiple Vitamin (MULTIVITAMIN) tablet Take 1 tablet by mouth daily.    . Multiple  Vitamins-Minerals (PRESERVISION AREDS 2 PO) Take 1 tablet by mouth daily.     . naproxen sodium (ALEVE) 220 MG tablet Take 220 mg by mouth daily as needed.     [name] oxybutynin (DITROPAN) 5 MG tablet Take 1 tablet (5 mg total) by mouth every 8 (eight) hours as needed for bladder spasms. 30 tablet 0  . polyethylene glycol (MIRALAX / GLYCOLAX) 17 g packet Take 17 g by mouth daily.    . Probiotic Product (PROBIOTIC DAILY PO) Take 2 tablets by mouth daily.     . tamsulosin (FLOMAX) 0.4 MG CAPS capsule Take 1 capsule (0.4 mg total) by mouth daily. 30 capsule 0  . triamcinolone cream (KENALOG) 0.1 % Apply 1 application topically as needed.     . Turmeric Curcumin 500 MG CAPS Take 1 capsule by mouth daily.     . predniSONE (DELTASONE) 10 MG tablet Take 3 tablets (30 mg total) by mouth daily with breakfast. (Patient not taking: Reported on 04/10/2020) 30 tablet 0   No current facility-administered medications for this visit.    Review of Systems  Constitutional: Positive for weight loss (2 lbs). Negative for chills, diaphoresis, fever and malaise/fatigue.       Feels "worse".  HENT: Positive for sore throat (with associated dysphagia (7/10); after eating; s/p recent surgery). Negative for congestion, ear discharge, ear pain, hearing loss, nosebleeds and sinus pain.   Eyes: Negative for blurred vision.  Respiratory: Positive for sputum production (thick saliva; s/p surgery) and shortness of breath (difficulty taking deep breaths since surgery). Negative for cough and hemoptysis.        Can not take deep breaths.  Cardiovascular: Positive for orthopnea and leg swelling (right leg and foot; wrapping foot with gauze). Negative for chest pain and palpitations.       Chest strains with deep breaths.  Gastrointestinal: Positive for abdominal pain. Negative for blood in stool, constipation (takes MiraLax), diarrhea, heartburn, melena, nausea and vomiting.       Eating about 1 cup of food. Drinking 1 quart of fluid  daily. Drinking 1 ensure Max a day. Occasional hiccups. Gassy.  Genitourinary: Positive for flank pain (right side). Negative for dysuria, frequency, hematuria and urgency.  Musculoskeletal: Positive for joint  pain (knee pain; RLE) and myalgias (bilateral legs). Negative for back pain and neck pain.  Skin: Negative for itching and rash.  Neurological: Positive for tremors (essential) and weakness. Negative for dizziness and headaches.  Endo/Heme/Allergies: Does not bruise/bleed easily.  Psychiatric/Behavioral: Negative for depression and memory loss. The patient has insomnia (related to abdominal discomfort x 2 weeks). The patient is not nervous/anxious.   All other systems reviewed and are negative.  Performance status (ECOG): 2  Vitals Blood pressure 126/67, pulse 77, temperature (!) 96 F (35.6 C), temperature source Tympanic, resp. rate 20, weight 118 lb (53.5 kg), SpO2 100 %.   Physical Exam  Constitutional: She is oriented to person, place, and time.  Thin slightly frail appearing woman sitting in clinic in no acute distress.  HENT:  Head: Normocephalic and atraumatic.  Mouth/Throat: Oropharynx is clear and moist. No oropharyngeal exudate.  Styled gray hair. Mask.  Eyes: Pupils are equal, round, and reactive to light. Conjunctivae and EOM are normal. No scleral icterus.  Gold rimmed glasses. Blue eyes.  Neck: No JVD present.  Cardiovascular: Normal rate, regular rhythm and normal heart sounds. Exam reveals no gallop.  No murmur heard. Pulmonary/Chest: Effort normal and breath sounds normal. No respiratory distress. She has no wheezes. She has no rales. She exhibits no tenderness.  Abdominal: Soft. Bowel sounds are normal. She exhibits no distension and no mass. There is abdominal tenderness (right sided- mild). There is no rebound and no guarding.  Musculoskeletal:        General: Tenderness (right thigh to ankle) and edema (right thigh to ankle) present. Normal range of motion.       Cervical back: Normal range of motion and neck supple.  Lymphadenopathy:       Head (right side): No preauricular, no posterior auricular and no occipital adenopathy present.       Head (left side): No preauricular, no posterior auricular and no occipital adenopathy present.    She has no cervical adenopathy.    She has no axillary adenopathy.       Right: No inguinal and no supraclavicular adenopathy present.       Left: No inguinal and no supraclavicular adenopathy present.  Neurological: She is alert and oriented to person, place, and time.  Skin: Skin is warm and dry. No rash noted. She is not diaphoretic. No erythema. No pallor.  Psychiatric: She has a normal mood and affect. Her behavior is normal. Judgment and thought content normal.  Nursing note and vitals reviewed.   No visits with results within 3 Day(s) from this visit.  Latest known visit with results is:  Appointment on 04/06/2020  Component Date Value Ref Range Status  . Glucose 04/06/2020 134* 65 - 99 mg/dL Final  . BUN 04/06/2020 29* 8 - 27 mg/dL Final  . Creatinine, Ser 04/06/2020 1.23* 0.57 - 1.00 mg/dL Final  . GFR calc non Af Amer 04/06/2020 40* >59 mL/min/1.73 Final  . GFR calc Af Amer 04/06/2020 46* >59 mL/min/1.73 Final   Comment: **[facility] currently reports eGFR in compliance with the current**   recommendations of the [employer]. [facility] will   update reporting as new guidelines are published from the NKF-ASN   Task force.   . BUN/Creatinine Ratio 04/06/2020 24  12 - 28 Final  . Sodium 04/06/2020 131* 134 - 144 mmol/L Final  . Potassium 04/06/2020 3.9  3.5 - 5.2 mmol/L Final  . Chloride 04/06/2020 94* 96 - 106 mmol/L Final  . CO2 04/06/2020  18* 20 - 29 mmol/L Final  . Calcium 04/06/2020 9.8  8.7 - 10.3 mg/dL Final    Assessment:  LAKRISTA SCADUTO is a 86 y.o. female with a history of stage IIIA follicular non-Hodgkin's lymphoma. She presented in the summer of 2003 with an  epigastric and left upper quadrant mass and left arm weakness with decreased mobility. Abdominal ultrasound revealed a large mass in the head of the pancreas with multiple enlarged lymph nodes and a large lobulated mass in the right retroperitoneal area. MRI of the left brachial plexus revealed a 2.5 cm soft tissue mass and abnormal lymph node along the left brachial plexus and in the supraclavicular region.  Cervical node biopsyon 08/06/2002 revealed a grade I follicular non-Hodgkin's lymphoma. Bone marrowon 08/09/2002 was negative. Lumbar puncture was negative. She receivedLeukeran and Rituxan. As her arm pain and mobility was not relieved, she received local radiationto the left brachial plexus area. Leukeran continued through 02/28/2013 (discontinued secondary side effects).  She developed progressive disease in 10/2003. Patient received CVPchemotherapy beginning 01/02/2004. In 06/2004 she was switched to CNOP. She received 8 cycles completing on 01/15/2005. She began maintenance Rituxanon 04/23/2004 (4 weekly doses every 3 months for 2 years). Therapy completed on 09/13/2008.   Excision of a 1 cm right preauricular masson 10/04/2017 revealed a low grade follicular lymphoma. CT guided biopsyof the retroperitoneal nodal masson 11/20/2017 revealed grade I follicular lymphoma. Immunohistochemistry revealed positive for CD10, BCL-2, BCL-6, Ki67 (15-20%) and negative for cyclin D1 and CD5. Flow cytometry revealed a CD10+ clonal B-cell population.   She received 4 weekly cycles of Rituxan(12/24/2017 - 01/14/2018) and 3 cycles of maintenance Rituxan(03/24/2018 - 10/08/2018).  PET scanon 11/08/2018 revealed a 5.5 x 7.3 cm hypermetabolic (SUV 10.1) right retroperitoneal nodal mass and aortocaval lymph node. CT guided biopsyon 75/09/2584 confirmed follicular lymphoma, grade I.  She received radiation(IMRT) of 3000 cGy to the retroperitoneal mass from 01/11/2019 -  01/31/2019.  Abdomen and pelvis CTon 03/11/2019 revealed partial treatment response. Right retroperitoneal nodal mass and aortocaval adenopathy haddecreased in size (7.1 x 5.8 cm to 6.5 x 4.1 cm).There was decreased mass effect on the IVC. There were no new sites of disease.  Abdomen and pelvis CTon 09/12/2019 revealed a significant interval decrease in size of a retroperitoneal mass that measured 4.6 x 2.8 cm (previously 6.5 x 4.1 cm). There was redemonstrated, adjacent post treatment retroperitoneal soft tissue about the inferior vena cava and aorta. Findings were consistent with ongoing treatment response. There was no discretely enlarged lymph nodes in the abdomen or pelvis.  Abdomen and pelvis CT on 03/21/2020 revealed moderate right hydroureteronephrosis which appeared to extend to the L5 level, but no definite obstructing calculus is noted.  The cause of the distal ureteral obstruction is unknown. There was sigmoid diverticulosis without inflammation and several small calcified uterine fibroids.  The following studies were negativeon 10/23/2017: hepatitis B core antibody, hepatitis B surface antigen, hepatitis C antibody. G6PD assay was normal.  She was admitted to [facility] 08/15/2015 - 08/25/2015 with bloody diarrhea then hemolytic uremic syndromerelated to E. coli 0157. She had a complicated course with acute renal failure and anemia. She was then in rehab for 3 weeks. She continues to work on her balance.  Bone densityon 10/27/2017 revealed osteoporosiswith a T-score of -3.8 in the left forearm radius. Bonedensityon 11/07/2019 revealed osteoporosis with a T score of -2.5 in the right femoral neck.She began every 6 month Proliaon 04/20/2018 (last01/03/2020).  She has right lower extremity edema.  Right lower extremity  duplex on 03/14/2020 revealed no evidence of DVT.  She has a 1 month history of abdominal discomfort.  Creatinine has increased.  She had new  right sided hydroureteronephrosis.  Abdomen and pelvis CT on 03/21/2020 revealed moderate right hydroureteronephrosis which appeared to extend to the L5 level, but without definite obstructing calculus.  The cause of the distal ureteral obstruction was unknown.  There was sigmoid diverticulosis without inflammation and several small calcified uterine fibroids.  Appears to be interval development of retroperitoneal adenopathy.  Left periaortic lymph node measures 2 cm.  There is a 2.6 cm right paratracheal node.  There is probable bilateral iliac adenopathy.  Adenopathy may be the cause of distal right ureteral obstruction.  PET scan on 04/05/2020 revealed hypermetabolic prevascular lymph nodes (nodes up to 13 mm; SUV 4.2) new from comparison PET CT 11/08/2018 c/w new lymphoma recurrence (Deauville 5). There was new hypermetabolic periaortic lymphadenopathy (Deauville 5); most intense node was 2.2 cm between IVC and aorta at the level of the kidneys (SUV 5.7).  There was a similar 1.6 cm left aorta node (SUV 3.7).  Spleen and marrow were normal. There was hydronephrosis of the RIGHT renal collecting system with stent in place  She underwent cystoscopy, right retrograde pyelogram, and right ureteral stent placement on 03/29/2020.  Symptomatically, she has persistent right sided abdominal discomfort and right lower extremity edema.  She has some irritation on swallowing felt secondary to lymph nodes in her neck.  Plan: 1.   Stage IIIAfollicular non-Hodgkin's lymphoma Symptomatically, she had had abdominal symptoms x 1 month.  Patient has been treated with several regimens in the past:   CVP, CNOP, and Rituxan.   Disease grew on maintenance Rituxan.  Patient is s/p IMRT to the retroperitoneal mass.   Abdominal and pelvic pelvis CT on 03/21/2020 suggested retroperitoneal adenopathy although imaging was limited secondary to lack of IV contrast.                   There was probable bilateral  iliac adenopathy.                     Adenopathy appeared to be the cause of her distal right ureteral obstruction Review PET scan images with patient and her husband in detail. Agree with radiology interpretation.        There are new hypermetabolic lymph nodes c/w lymphoma recurrence.   SUV in lymph nodes suggests follicular lymphoma (rather than transformation).   Right hydronephrosis remains despite stent placement.       Discuss potential treatment options:   Revlimid and obinutuzimab [name]).   Ibritumomab tiuxetan (Zevalin Y-90)   Palliative radiation to relive obstruction (not ideal option given systemic disease).  Discuss consideration of Revlimid and obinutuzumab.   Potential side effects reviewed.   Information provided.   Preauth Revlimid and obinutuzumab.  Discuss presentation at tumor board on 04/12/2020. 3.   Right hydroureteronephrosis             Adenopathy causing obstruction.             Patient is s/p ureteral stent placement.  Follow-up renal ultrasound confirmed persistent obstruction.  Discuss plan for external nephrostomy.  Contact Dr Erlene Quan- done. 4.   Right lower extremity edema and shortness of breath             Etiology secondary to obstructive adenopathy in abdomen/pelvis limiting venous return.             Duplex on  03/14/2020 revealed no evidence of DVT.             Patient with increase in swelling and shortness of breath.  Repeat right lower extremity duplex today.  VQ scan given shortness of breath and persistent increase increase in creatinine. 5.   Dysphagia  Temporally related to oral intubation.  Initially suspected some component of edema.  Five day prednisone pulse completed without improvement in symptoms.  Patient on Dexilant.  Imaging studies revealed adenopathy in neck. 6.   STAT RLE duplex. 7.   STAT VQ scan. 8.   RTC on 04/13/2020 for MD assessment, labs (CBC with diff, CMP, LDH, uric acid), and day 1 of cycle #1 obinutuzumab  and Revlimid.  I discussed the assessment and treatment plan with the patient.  The patient was provided an opportunity to ask questions and all were answered.  The patient agreed with the plan and demonstrated an understanding of the instructions.  The patient was advised to call back if the symptoms worsen or if the condition fails to improve as anticipated.  I provided 31 minutes of face-to-face time during this this encounter and > 50% was spent counseling as documented under my assessment and plan.  I provided these services from the [facility] office.   An additional 10 minutes were spent reviewing her chart ([facility] and [place]) including notes, labs, and imaging studies and discussion g stent issues with Dr Erlene Quan.    Lequita Asal, MD, PhD    04/10/2020, 9:41 AM  I, Selena Batten, am acting as scribe for [employer]. Mike Gip, MD, PhD.  I, Trenesha Alcaide C. Mike Gip, MD, have reviewed the above documentation for accuracy and completeness, and I agree with the above.

## 2020-04-10 ENCOUNTER — Other Ambulatory Visit: Payer: Self-pay

## 2020-04-10 ENCOUNTER — Other Ambulatory Visit: Payer: Self-pay | Admitting: Hematology and Oncology

## 2020-04-10 ENCOUNTER — Ambulatory Visit
Admission: RE | Admit: 2020-04-10 | Discharge: 2020-04-10 | Disposition: A | Discharge status: Home / Self Care | Payer: Medicare Other | Source: Ambulatory Visit | Attending: Hematology and Oncology | Admitting: Hematology and Oncology

## 2020-04-10 ENCOUNTER — Encounter: Payer: Self-pay | Admitting: Hematology and Oncology

## 2020-04-10 ENCOUNTER — Inpatient Hospital Stay (HOSPITAL_BASED_OUTPATIENT_CLINIC_OR_DEPARTMENT_OTHER): Payer: Medicare Other | Admitting: Hematology and Oncology

## 2020-04-10 ENCOUNTER — Telehealth: Payer: Self-pay | Admitting: Pharmacist

## 2020-04-10 ENCOUNTER — Telehealth: Payer: Self-pay | Admitting: Pharmacy Technician

## 2020-04-10 VITALS — BP 126/67 | HR 77 | Temp 96.0°F | Resp 20 | Wt 118.0 lb

## 2020-04-10 DIAGNOSIS — R0602 Shortness of breath: Secondary | ICD-10-CM

## 2020-04-10 DIAGNOSIS — N1330 Unspecified hydronephrosis: Secondary | ICD-10-CM | POA: Diagnosis not present

## 2020-04-10 DIAGNOSIS — C8203 Follicular lymphoma grade I, intra-abdominal lymph nodes: Secondary | ICD-10-CM

## 2020-04-10 DIAGNOSIS — R600 Localized edema: Secondary | ICD-10-CM

## 2020-04-10 DIAGNOSIS — R1310 Dysphagia, unspecified: Secondary | ICD-10-CM

## 2020-04-10 DIAGNOSIS — Z7189 Other specified counseling: Secondary | ICD-10-CM

## 2020-04-10 DIAGNOSIS — C8298 Follicular lymphoma, unspecified, lymph nodes of multiple sites: Secondary | ICD-10-CM | POA: Diagnosis not present

## 2020-04-10 DIAGNOSIS — C8208 Follicular lymphoma grade I, lymph nodes of multiple sites: Secondary | ICD-10-CM

## 2020-04-10 MED ORDER — LENALIDOMIDE 5 MG PO CAPS: 5.0000 mg | ORAL_CAPSULE | Freq: Every day | ORAL | 0 refills | Status: DC

## 2020-04-10 MED ORDER — TECHNETIUM TO 99M ALBUMIN AGGREGATED
4.3500 | Freq: Once | INTRAVENOUS | Status: AC | PRN
  Administered 2020-04-10: 4.35 via INTRAVENOUS

## 2020-04-10 NOTE — Telephone Encounter (Signed)
Oral Oncology Pharmacist Encounter  Received new prescription for Revlimid (lenalidomide) for the treatment of recurrent follicular lymphoma in conjunction with obinutuzumab, planned duration until disease control or unacceptable drug toxicity.  BMP from 04/06/20 assessed, no relevant lab abnormalities. Prescription dose and frequency assessed. Given age decline in renal function, dose adjusted appropriately.   Current medication list in Epic reviewed, no DDIs with lenalidomide identified.  Prescription has been e-scribed to [employer] for benefits analysis and approval.  Oral Oncology Clinic will continue to follow for insurance authorization, copayment issues, initial counseling and start date.  Darl Pikes, PharmD, BCPS, BCOP, CPP Hematology/Oncology Clinical Pharmacist [facility]/HP/AP Oral [facility] [place] [phone]  04/10/2020 12:20 PM

## 2020-04-10 NOTE — Patient Instructions (Signed)
Lenalidomide Oral Capsules What is this medicine? LENALIDOMIDE (len a LID oh mide) is a chemotherapy drug that targets specific proteins within cancer cells and stops the cancer cell from growing. It is used to treat multiple myeloma, certain types of lymphoma, and some myelodysplastic syndromes that cause severe anemia requiring blood transfusions. This medicine may be used for other purposes; ask your health care provider or pharmacist if you have questions. COMMON BRAND NAME(S): Revlimid What should I tell my health care provider before I take this medicine? They need to know if you have any of these conditions:  blood clots in the legs or the lungs  high blood pressure  high cholesterol  infection  irregular monthly periods or menstrual cycles  kidney disease  liver disease  smoke tobacco  thyroid disease  an unusual or allergic reaction to lenalidomide, thalidomide, other medicines, foods, dyes, or preservatives  pregnant or trying to get pregnant  breast-feeding How should I use this medicine? Take this medicine by mouth with a glass of water. Follow the directions on the prescription label. Do not cut, crush, or chew this medicine. Take your medicine at regular intervals. Do not take it more often than directed. Do not stop taking except on your doctor's advice. A MedGuide will be given with each prescription and refill. Read this guide carefully each time. The MedGuide may change frequently. Talk to your pediatrician regarding the use of this medicine in children. Special care may be needed. Overdosage: If you think you have taken too much of this medicine contact a poison control center or emergency room at once. NOTE: This medicine is only for you. Do not share this medicine with others. What if I miss a dose? If you miss a dose, take it as soon as you can. If your next dose is to be taken in less than 12 hours, then do not take the missed dose. Take the next dose at  your regular time. Do not take double or extra doses. What may interact with this medicine? This medicine may interact with the following medications:  digoxin  medicines that increase the risk of thrombosis like estrogens or erythropoietic agents (e.g., epoetin alfa and darbepoetin alfa)  warfarin This list may not describe all possible interactions. Give your health care provider a list of all the medicines, herbs, non-prescription drugs, or dietary supplements you use. Also tell them if you smoke, drink alcohol, or use illegal drugs. Some items may interact with your medicine. What should I watch for while using this medicine? You may need blood work done while you are taking this medicine. This medicine may cause serious skin reactions. They can happen weeks to months after starting the medicine. Contact your health care provider right away if you notice fevers or flu-like symptoms with a rash. The rash may be red or purple and then turn into blisters or peeling of the skin. Or, you might notice a red rash with swelling of the face, lips or lymph nodes in your neck or under your arms. This medicine is available only through a special program. Doctors, pharmacies, and patients must meet all of the conditions of the program. Your health care provider will help you get signed up with the program if you need this medicine. Through the program you will only receive up to a 28 day supply of the medicine at one time. You will need a new prescription for each refill. This medicine can cause birth defects. Do not get pregnant while  taking this drug. Females with child-bearing potential will need to have 2 negative pregnancy tests before starting this medicine. Pregnancy testing must be done every 2 to 4 weeks as directed while taking this medicine. Use 2 reliable forms of birth control together while you are taking this medicine and for 4 weeks after you stop taking this medicine. If you think that you  might be pregnant talk to your doctor right away. Do not breast-feed an infant while taking this medicine. Men must use a latex condom during sexual contact with a woman while taking this medicine and for 4 weeks after you stop taking this medicine. A latex condom is needed even if you have had a vasectomy. Contact your doctor right away if your partner becomes pregnant. Do not donate sperm while taking this medicine and for 4 weeks after you stop taking this medicine. Do not give blood while taking the medicine and for 4 weeks after completion of treatment to avoid exposing pregnant women to the medicine through the donated blood. Talk to your doctor about your risk of cancer. You may be more at risk for certain types of cancers if you take this medicine. What side effects may I notice from receiving this medicine? Side effects that you should report to your doctor or health care professional as soon as possible:  allergic reactions like skin rash, itching or hives, swelling of the face, lips, or tongue  breathing problems  chest pain or tightness  fast, irregular heartbeat  feeling faint  low blood counts - this medicine may decrease the number of white blood cells, red blood cells and platelets. You may be at increased risk for infections and bleeding.  rash, fever, and swollen lymph nodes  redness, blistering, peeling or loosening of the skin, including inside the mouth  seizures  signs and symptoms of bleeding such as bloody or black, tarry stools; red or dark-brown urine; spitting up blood or brown material that looks like coffee grounds; red spots on the skin; unusual bruising or bleeding from the eye, gums, or nose  signs and symptoms of a blood clot such as breathing problems; changes in vision; chest pain; severe, sudden headache; pain, swelling, warmth in the leg; trouble speaking; sudden numbness or weakness of the face, arm or leg  signs and symptoms of liver injury like  dark yellow or brown urine; general ill feeling or flu-like symptoms; light-colored stools; loss of appetite; nausea; right upper belly pain; unusually weak or tired; yellowing of the eyes or skin  signs and symptoms of a stroke like changes in vision; confusion; trouble speaking or understanding; severe headaches; sudden numbness or weakness of the face, arm or leg; trouble walking; dizziness; loss of balance or coordination  sweating  vomiting Side effects that usually do not require medical attention (report to your doctor or health care professional if they continue or are bothersome):  constipation  cough  diarrhea  joint pain  muscle cramps  swelling of the arms, legs, or skin  tiredness  trouble sleeping This list may not describe all possible side effects. Call your doctor for medical advice about side effects. You may report side effects to FDA at 1-800-FDA-1088. Where should I keep my medicine? Keep out of the reach of children. Store at room temperature between 15 and 30 degrees C (59 and 86 degrees F). Throw away any unused medicine after the expiration date. NOTE: This sheet is a summary. It may not cover all possible information. If you  have questions about this medicine, talk to your doctor, pharmacist, or health care provider.  2020 Elsevier/Gold Standard (2019-03-04 15:09:17)   Obinutuzumab injection What is this medicine? OBINUTUZUMAB (OH bi nue TOOZ ue mab) is a monoclonal antibody. It is used to treat chronic lymphocytic leukemia (CLL) and a type of non-Hodgkin lymphoma (NHL), follicular lymphoma. This medicine may be used for other purposes; ask your health care provider or pharmacist if you have questions. COMMON BRAND NAME(S): GAZYVA What should I tell my health care provider before I take this medicine? They need to know if you have any of these conditions:  infection (especially a virus infection such as hepatitis B virus)  lung or breathing  disease  heart disease  take medicines that treat or prevent blood clots  an unusual or allergic reaction to obinutuzumab, other medicines, foods, dyes, or preservatives  pregnant or trying to get pregnant  breast-feeding How should I use this medicine? This medicine is for infusion into a vein. It is given by a health care professional in a hospital or clinic setting. Talk to your pediatrician regarding the use of this medicine in children. Special care may be needed. Overdosage: If you think you have taken too much of this medicine contact a poison control center or emergency room at once. NOTE: This medicine is only for you. Do not share this medicine with others. What if I miss a dose? Keep appointments for follow-up doses as directed. It is important not to miss your dose. Call your doctor or health care professional if you are unable to keep an appointment. What may interact with this medicine?  live virus vaccines This list may not describe all possible interactions. Give your health care provider a list of all the medicines, herbs, non-prescription drugs, or dietary supplements you use. Also tell them if you smoke, drink alcohol, or use illegal drugs. Some items may interact with your medicine. What should I watch for while using this medicine? Report any side effects that you notice during your treatment right away, such as changes in your breathing, fever, chills, dizziness or lightheadedness. These effects are more common with the first dose. Visit your prescriber or health care professional for checks on your progress. You will need to have regular blood work. Report any other side effects. The side effects of this medicine can continue after you finish your treatment. Continue your course of treatment even though you feel ill unless your doctor tells you to stop. Call your doctor or health care professional for advice if you get a fever, chills or sore throat, or other symptoms  of a cold or flu. Do not treat yourself. This drug decreases your body's ability to fight infections. Try to avoid being around people who are sick. This medicine may increase your risk to bruise or bleed. Call your doctor or health care professional if you notice any unusual bleeding. Do not become pregnant while taking this medicine or for 6 months after stopping it. Women should inform their doctor if they wish to become pregnant or think they might be pregnant. There is a potential for serious side effects to an unborn child. Talk to your health care professional or pharmacist for more information. Do not breast-feed an infant while taking this medicine or for 6 months after stopping it. What side effects may I notice from receiving this medicine? Side effects that you should report to your doctor or health care professional as soon as possible:  allergic reactions like skin  rash, itching or hives, swelling of the face, lips, or tongue  breathing problems  changes in vision  chest pain or chest tightness  confusion  dizziness  loss of balance or coordination  low blood counts - this medicine may decrease the number of white blood cells, red blood cells and platelets. You may be at increased risk for infections and bleeding.  signs of decreased platelets or bleeding - bruising, pinpoint red spots on the skin, black, tarry stools, blood in the urine  signs of infection - fever or chills, cough, sore throat, pain or trouble passing urine  signs and symptoms of liver injury like dark yellow or brown urine; general ill feeling or flu-like symptoms; light-colored stools; loss of appetite; nausea; right upper belly pain; unusually weak or tired; yellowing of the eyes or skin  trouble speaking or understanding  trouble walking  vomiting Side effects that usually do not require medical attention (report to your doctor or health care professional if they continue or are  bothersome):  constipation  joint pain  muscle pain This list may not describe all possible side effects. Call your doctor for medical advice about side effects. You may report side effects to FDA at 1-800-FDA-1088. Where should I keep my medicine? This drug is only given in a hospital or clinic and will not be stored at home. NOTE: This sheet is a summary. It may not cover all possible information. If you have questions about this medicine, talk to your doctor, pharmacist, or health care provider.  2020 Elsevier/Gold Standard (2019-03-15 15:34:53)

## 2020-04-10 NOTE — Progress Notes (Signed)
START OFF PATHWAY REGIMEN - Lymphoma and CLL   [id number]:Lenalidomide 20 mg PO Daily D1-21/D2-22 + Obinutuzumab 1,000 mg IV D8,15,22/D1 q28 Days x 6 Cycles (Induction):   Cycle 1: A cycle is 28 days:     Lenalidomide      Obinutuzumab    Cycles 2 through 6: A cycle is every 28 days:     Lenalidomide      Obinutuzumab   **Always confirm dose/schedule in your pharmacy ordering system**  Patient Characteristics: Follicular Lymphoma, Grades 1, 2, and 3A, Third Line, No Prior Lenalidomide Disease Type: Follicular Lymphoma, Grade 1, 2, or 3A Disease Type: Not Applicable Disease Type: Not Applicable [place] Stage: III Line of Therapy: Third Line Treatment History: No Prior Lenalidomide Intent of Therapy: Non-Curative / Palliative Intent, Discussed with Patient

## 2020-04-10 NOTE — Progress Notes (Signed)
Patient has swelling in right leg. And is here for PET scan results. Patient would like to discuss nephro-ostomy bag with Dr Mike Gip. Her blockage isnt being relieved by the stent that was placed. She sees her PCP tomorrow for edema in leg.

## 2020-04-11 ENCOUNTER — Other Ambulatory Visit: Payer: Self-pay | Admitting: Hematology and Oncology

## 2020-04-11 ENCOUNTER — Telehealth: Payer: Self-pay

## 2020-04-11 ENCOUNTER — Other Ambulatory Visit: Payer: Self-pay

## 2020-04-11 ENCOUNTER — Other Ambulatory Visit: Payer: Self-pay | Admitting: *Deleted

## 2020-04-11 DIAGNOSIS — G893 Neoplasm related pain (acute) (chronic): Secondary | ICD-10-CM

## 2020-04-11 DIAGNOSIS — C8200 Follicular lymphoma grade I, unspecified site: Secondary | ICD-10-CM

## 2020-04-11 MED ORDER — LENALIDOMIDE 5 MG PO CAPS: 5.0000 mg | ORAL_CAPSULE | Freq: Every day | ORAL | 0 refills | Status: DC

## 2020-04-11 MED ORDER — HYDROCODONE-ACETAMINOPHEN 5-325 MG PO TABS: ORAL_TABLET | ORAL | 0 refills | Status: DC

## 2020-04-11 NOTE — Telephone Encounter (Signed)
Spoke with [name] to inform him that the rx has been sent to [facility] pharmacy. The patient was understanding and agreeable to pick it up.

## 2020-04-11 NOTE — Telephone Encounter (Signed)
Oral Oncology Patient Advocate Encounter  Patient does not have pharmacy coverage.  Met patient and husband in lobby to complete application for [employer] Patient Assistance in an effort to reduce patient's out of pocket expense for Revlimid to $0.    Application completed and faxed to [phone] on 04/10/20.   [employer] patient assistance phone number for follow up is [phone].   This encounter will be updated until final determination.   Judy Mcadams CPHT Specialty Pharmacy Patient Advocate  [facility] Phone [phone] Fax [phone] 04/11/2020 12:58 PM    

## 2020-04-11 NOTE — Telephone Encounter (Signed)
Oral Oncology Patient Advocate Encounter   [employer] has 2 ways to qualify for assistance.  One is based on income and the other is if the patient has paid 3% of their gross income on prescriptions.    Since the patient does not meet the income requirement she has to meet the 3% out of pocket for prescriptions.  The patient and her husband have agreed to pay for 6 capsules of Revlimid to meet the out of pocket requirement.    [place] [facility] [place] Phone [phone] Fax [phone] 04/11/2020 2:52 PM

## 2020-04-11 NOTE — Telephone Encounter (Signed)
Patient needs refill of Hydrocodone and Prednisone prescriptions from 4/20. She is soon to be out and was told yesterday that they would be refilled

## 2020-04-12 ENCOUNTER — Telehealth: Payer: Self-pay

## 2020-04-12 ENCOUNTER — Other Ambulatory Visit: Payer: Self-pay | Admitting: Radiology

## 2020-04-12 ENCOUNTER — Other Ambulatory Visit: Payer: Self-pay

## 2020-04-12 ENCOUNTER — Ambulatory Visit
Admission: RE | Admit: 2020-04-12 | Discharge: 2020-04-12 | Disposition: A | Discharge status: Home / Self Care | Payer: Medicare Other | Attending: Hematology and Oncology | Admitting: Hematology and Oncology

## 2020-04-12 ENCOUNTER — Other Ambulatory Visit: Payer: Self-pay | Admitting: Hematology and Oncology

## 2020-04-12 ENCOUNTER — Ambulatory Visit
Admission: RE | Admit: 2020-04-12 | Discharge: 2020-04-12 | Disposition: A | Discharge status: Home / Self Care | Payer: Medicare Other | Source: Ambulatory Visit | Attending: Hematology and Oncology | Admitting: Hematology and Oncology

## 2020-04-12 ENCOUNTER — Other Ambulatory Visit: Payer: Medicare Other

## 2020-04-12 DIAGNOSIS — Z800 Family history of malignant neoplasm of digestive organs: Secondary | ICD-10-CM | POA: Insufficient documentation

## 2020-04-12 DIAGNOSIS — Z79899 Other long term (current) drug therapy: Secondary | ICD-10-CM | POA: Diagnosis not present

## 2020-04-12 DIAGNOSIS — N1330 Unspecified hydronephrosis: Secondary | ICD-10-CM | POA: Diagnosis present

## 2020-04-12 DIAGNOSIS — R7989 Other specified abnormal findings of blood chemistry: Secondary | ICD-10-CM | POA: Diagnosis present

## 2020-04-12 DIAGNOSIS — R918 Other nonspecific abnormal finding of lung field: Secondary | ICD-10-CM | POA: Diagnosis present

## 2020-04-12 DIAGNOSIS — C8290 Follicular lymphoma, unspecified, unspecified site: Secondary | ICD-10-CM | POA: Diagnosis not present

## 2020-04-12 NOTE — Progress Notes (Addendum)
Tumor Board Documentation  KIMIYO GRILLI was presented by Dr Mike Gip at our Tumor Board on 04/12/2020, which included representatives from medical oncology, radiation oncology, navigation, pathology, radiology, surgical oncology, internal medicine, genetics, pulmonology, palliative care, research.  Lennix currently presents as a current patient, for discussion with history of the following treatments: neoadjuvant chemotherapy, immunotherapy.  Additionally, we reviewed previous medical and familial history, history of present illness, and recent lab results along with all available histopathologic and imaging studies. The tumor board considered available treatment options and made the following recommendations: Immunotherapy with Gazyva and Revlimid Patient to have an external Nephrosty tube placed  The following procedures/referrals were also placed: No orders of the defined types were placed in this encounter.   Clinical Trial Status: not discussed   Staging used: Pathologic Stage  AJCC Staging:       Group: History of NonHodgkins Lymphoma   National site-specific guidelines   were discussed with respect to the case.  Tumor board is a meeting of clinicians from various specialty areas who evaluate and discuss patients for whom a multidisciplinary approach is being considered. Final determinations in the plan of care are those of the provider(s). The responsibility for follow up of recommendations given during tumor board is that of the provider.   Today's extended care, comprehensive team conference, Raynie was not present for the discussion and was not examined.   Multidisciplinary Tumor Board is a multidisciplinary case peer review process.  Decisions discussed in the Multidisciplinary Tumor Board reflect the opinions of the specialists present at the conference without having examined the patient.  Ultimately, treatment and diagnostic decisions rest with the primary  provider(s) and the patient.

## 2020-04-12 NOTE — Progress Notes (Signed)
[facility]  [address], [address] [place], [place] [address] Phone: [phone]  Fax: [phone]   Clinic Day:  04/13/2020  Referring physician: Marinda Elk, MD  Chief Complaint: [name] is a 86 y.o. female with a history of stage IIIA follicular non-Hodgkin's lymphoma who is seen for review of interval imaging and assessment s/p external stent placement and prior to day 1 of cycle #1 Revlimid and obinutuzumab.    HPI: The patient was last seen in the medical oncology clinic on 04/10/2020. At that time, she felt her symptoms were worsening.  She reported increased swelling in her right leg.  She continued to have insomnia. She noted right flank pain as well as abdominal pain. She felt like she was unable to take deep breaths and admitted to "some strain" in her chest.   Concern was raised for a possible pulmonary embolism.  Because of her elevated creatinine, she was unable to undergo a chest CT angiogram.  A VQ scan was ordered.  Perfusion lung scan on 04/10/2020 revealed very low probability for pulmonary embolism. Chest x-ray revealed Ill-defined opacity in the right mid to upper lung which may reflect early bronchopneumonia. Follow up CXR on 04/12/2020 revealed chronic underlying lung changes. There was persistent patchy right upper lobe airspace opacity felt secondary to a small infiltrate or area of atelectasis.   Right lower extremity venous doppler ultrasound 04/10/2020 revealed no DVT.  There was a 3.4 x 0.8 x 2.9 cm anechoic fluid collection with the right popliteal fossa compatible with a Baker's cyst.   She was presented at tumor board on 04/12/2020.  She was felt to have recurrent follicular lymphoma without evidence of transformation.  Consensus opinion was to proceed with obinutuzumab [name]) and Revlimid.  Her 1st infusion is scheduled in [place] on 04/16/2020.    At last visit, she was noted to have an non functioning right  ureteral stent.  She underwent right external nephrostomy tube placement today in interventional radiology.  Symptomatically, she has had pain in her back since stent placement; she rates her pain to be a 10. She takes hydrocodone-Tylenol 5-325 mg for pain. She last took one at 9 am this morning. At home she takes her pain medication every 4 hours. She denies any fatigue associated with the pill. She says phlegm comes up in her throat whenever she eats anything. She tries to take deep breaths but it can be difficult for her. She denies any choking episodes. Her right ureteral tube will be removed on 04/18/2020. She denies taking any aspirin but she says she used to take one until her cardiologist instructed her to stop.  She consents to treatment on Monday, 04/16/2020.  She would like her infusion appointments to be early in the morning.    Past Medical History:  Diagnosis Date  . Arthritis    osteoporosis also. prolia treatments every 6 months  . Baker cyst, right 03/2020  . Cancer [facility]) 2013   Non-Hodgkin Lymphoma with chemo and rad tx  . Complication of anesthesia   . Dehydration    low sodium levels  . Dysrhythmia   . Essential tremor   . Follicular lymphoma grade I ([place]) 08/06/2002  . GERD (gastroesophageal reflux disease)   . Hearing loss of both ears    wears bilateral hearing aides  . 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 History of blood transfusion   . History of chemotherapy   .  History of radiation therapy   . Hypertension   . Labile blood pressure   . Meniere's disease    with bilateral hearing loss  . Mini stroke [facility])    summer 2014  . Osteoporosis   . PONV (postoperative nausea and vomiting)   . Renal artery aneurysm ([place])   . Stroke [facility]) 2015   possible mini stroke. loss of conciousness while sitting at table. could not diagnose  . Syncope     Past Surgical History:  Procedure Laterality Date  . APPENDECTOMY    . BONE MARROW BIOPSY   08/09/2002  . BREAST LUMPECTOMY  1950s   benign  . CATARACT EXTRACTION  03/2009  . COLONOSCOPY    . CYSTOSCOPY WITH STENT PLACEMENT Right 03/29/2020   Procedure: CYSTOSCOPY WITH STENT PLACEMENT;  Surgeon: Hollice Espy, MD;  Location: [facility] ORS;  Service: Urology;  Laterality: Right;  . ESOPHAGOGASTRODUODENOSCOPY (EGD) WITH PROPOFOL N/A 09/23/2019   Procedure: ESOPHAGOGASTRODUODENOSCOPY (EGD) WITH PROPOFOL;  Surgeon: Jonathon Bellows, MD;  Location: [facility] ENDOSCOPY;  Service: Gastroenterology;  Laterality: N/A;  . FRACTURE SURGERY Left    wrist  . IR NEPHROSTOMY PLACEMENT RIGHT  04/13/2020  . LYMPH NODE BIOPSY  08/07/2002   cervical  . TUBAL LIGATION      Family History  Problem Relation Age of Onset  . Cancer Father        Throat  . Cancer Sister 66       Colon    Social History:  reports that she has never smoked. She has never used smokeless tobacco. She reports that she does not drink alcohol or use drugs. She lives in [place].She and her husband are getting restless staying at home during the pandemic. The patient is accompanied by her husband today.   Allergies:  Allergies  Allergen Reactions  . Lisinopril Cough  . Primidone Other (See Comments)    Makes her feel "loopy and unsteady" Also, did not help the tremors    Current Medications: Current Outpatient Medications  Medication Sig Dispense Refill  . Acetaminophen (TYLENOL ARTHRITIS EXT RELIEF PO) Take 1 tablet by mouth daily as needed (pain).    . AZELASTINE & FLUTICASONE NA 1 spray each nase twice daily.    . calcium-vitamin D (CALCIUM 500/D) 500-200 MG-UNIT tablet Take 1 tablet by mouth 2 (two) times daily.     [name] denosumab (PROLIA) 60 MG/ML SOSY injection Inject 60 mg into the skin every 6 (six) months.     . DEXILANT 60 MG capsule TAKE ONE CAPSULE BY MOUTH ONE TIME DAILY  90 capsule 1  . Flax Oil-Fish Oil-Borage Oil (FISH OIL-FLAX OIL-BORAGE OIL) CAPS Take 1,000-1,400 mg by mouth daily.    . Glucosamine HCl-MSM  (GLUCOSAMINE-MSM PO) Take 1 tablet by mouth daily.     [name] lenalidomide (REVLIMID) 5 MG capsule Take 1 capsule (5 mg total) by mouth daily. Take for 21 days, then hold for 7 days. Repeat every 28 days. 6 capsule 0  . Magnesium Citrate 125 MG CAPS Take 1 tablet by mouth daily.     . melatonin 5 MG TABS Take 5 mg by mouth at bedtime as needed.     . Multiple Vitamin (MULTIVITAMIN) tablet Take 1 tablet by mouth daily.    . Multiple Vitamins-Minerals (PRESERVISION AREDS 2 PO) Take 1 tablet by mouth daily.     . naproxen sodium (ALEVE) 220 MG tablet Take 220 mg by mouth daily as needed.     [name] oxybutynin (DITROPAN) 5 MG tablet  Take 1 tablet (5 mg total) by mouth every 8 (eight) hours as needed for bladder spasms. 30 tablet 0  . polyethylene glycol (MIRALAX / GLYCOLAX) 17 g packet Take 17 g by mouth daily.    . Probiotic Product (PROBIOTIC DAILY PO) Take 2 tablets by mouth daily.     . tamsulosin (FLOMAX) 0.4 MG CAPS capsule Take 1 capsule (0.4 mg total) by mouth daily. 30 capsule 0  . triamcinolone cream (KENALOG) 0.1 % Apply 1 application topically as needed.     . Turmeric Curcumin 500 MG CAPS Take 1 capsule by mouth daily.     [name] HYDROcodone-acetaminophen (NORCO) 5-325 MG tablet 1/2 to 1 tablet every 4 hours as needed for pain. (Patient not taking: Reported on 04/13/2020) 40 tablet 0  . predniSONE (DELTASONE) 10 MG tablet Take 3 tablets (30 mg total) by mouth daily with breakfast. (Patient not taking: Reported on 04/10/2020) 30 tablet 0   No current facility-administered medications for this visit.   Facility-Administered Medications Ordered in Other Visits  Medication Dose Route Frequency Provider Last Rate Last Admin  . 0.9 %  sodium chloride infusion   Intravenous Continuous Allred, Darrell K, PA-C 50 mL/hr at 04/13/20 1021 New Bag at 04/13/20 1021  . ceFAZolin (ANCEF) 2-4 GM/100ML-% IVPB           . ceFAZolin (ANCEF) IVPB 2g/100 mL premix  2 g Intravenous to XRAY Allred, Darrell K, PA-C      .  fentaNYL (SUBLIMAZE) 100 MCG/2ML injection           . midazolam (VERSED) 2 MG/2ML injection             Review of Systems  Constitutional: Positive for weight loss (3 lb). Negative for chills, diaphoresis, fever and malaise/fatigue.       Pain s/p nephrostomy tube placement.   HENT: Negative for congestion, ear discharge, hearing loss, nosebleeds, sinus pain, sore throat and tinnitus.   Eyes: Negative.  Negative for blurred vision and double vision.  Respiratory: Positive for shortness of breath (difficulty taking deep breaths since surgery). Negative for cough, hemoptysis, sputum production and stridor.        Cannot take deep breaths.  Cardiovascular: Positive for orthopnea and leg swelling (right leg and foot; wrapping  foot with gauze). Negative for chest pain and palpitations.       Chest strains with deep breaths.  Gastrointestinal: Positive for abdominal pain. Negative for blood in stool, constipation (takes MiraLax), diarrhea, heartburn, melena, nausea and vomiting.       Dysphagia associated with non-compressive upper airway adenonopathy.  Thick saliva.  Genitourinary: Negative for dysuria, flank pain, frequency, hematuria and urgency.       S/p right external nephrostomy tube placement today.  Musculoskeletal: Positive for joint pain (knee) and myalgias (right lower extremity). Negative for back pain, falls and neck pain.  Skin: Negative for itching and rash.  Neurological: Positive for tremors (essential) and weakness. Negative for dizziness, sensory change, speech change, focal weakness and headaches.  Endo/Heme/Allergies: Negative.  Does not bruise/bleed easily.  Psychiatric/Behavioral: Negative for depression and memory loss. The patient has insomnia (related to abdominal discomfort x 2 weeks). The patient is not nervous/anxious.   All other systems reviewed and are negative.  Performance status (ECOG):  2  Vitals Blood pressure 137/60, pulse 77, temperature (!) 96.3 F  (35.7 C), temperature source Tympanic, resp. rate 18, height _0  (1.676 m), weight 115 lb (52.2 kg), SpO2 100 %.   Physical  Exam  Constitutional: She is oriented to person, place, and time. No distress. Face mask in place.  Thin slightly frail appearing woman sitting in clinic in no acute distress. She was examined in her wheelchair.  HENT:  Head: Normocephalic and atraumatic.  Mouth/Throat: Mucous membranes are normal. No oral lesions.  Styled gray hair.  Eyes: Pupils are equal, round, and reactive to light. Conjunctivae and EOM are normal. Right eye exhibits no discharge. Left eye exhibits no discharge. No scleral icterus.  Gold rimmed glasses. Blue eyes.   Neck: No JVD present.  Cardiovascular: Normal rate, regular rhythm and normal heart sounds. Exam reveals no gallop and no friction rub.  No murmur heard. Pulmonary/Chest: Effort normal and breath sounds normal. No respiratory distress. She has no wheezes. She has no rhonchi. She has no rales.  Abdominal: Soft. Normal appearance and bowel sounds are normal. She exhibits no distension and no mass. There is no hepatosplenomegaly. There is no abdominal tenderness. There is no rebound, no guarding and no CVA tenderness.  Genitourinary:    Genitourinary Comments: Right external nephrostomy tube.  Tubing with slightly bloody urine.   Musculoskeletal:        General: Edema (chronic right lower extremity) present. No tenderness. Normal range of motion.     Cervical back: Normal range of motion and neck supple.  Lymphadenopathy:       Head (right side): No preauricular, no posterior auricular and no occipital adenopathy present.       Head (left side): No preauricular, no posterior auricular and no occipital adenopathy present.    She has no cervical adenopathy.  Neurological: She is alert and oriented to person, place, and time.  Skin: Skin is warm, dry and intact. No bruising, no lesion and no rash noted. She is not diaphoretic. No  erythema. No pallor.  Psychiatric: She has a normal mood and affect. Her behavior is normal. Judgment and thought content normal.  Nursing note and vitals reviewed.   Imaging studies: 11/08/2018:  PET scanrevealed a 5.5 x 7.3 cm hypermetabolic (SUV 06.3) right retroperitoneal nodal mass and aortocaval lymph node.  03/11/2019:  Abdomen and pelvis CTrevealed partial treatment response. Right retroperitoneal nodal mass and aortocaval adenopathy haddecreased in size (7.1 x 5.8 cm to 6.5 x 4.1 cm).There was decreased mass effect on the IVC. There were no new sites of disease. 09/12/2019:  Abdomen and pelvis CTrevealed a significant interval decrease in size of a retroperitoneal massthat measured 4.6 x 2.8 cm (previously 6.5 x 4.1 cm). There was redemonstrated, adjacent post treatment retroperitoneal soft tissue about the inferior vena cava and aorta. Findings were consistent with ongoing treatment response. There was no discretely enlarged lymph nodes in the abdomen or pelvis. 03/21/2020:  Abdomen and pelvis CT revealed moderate right hydroureteronephrosis which appearedto extend to the L5 level, but withoutdefinite obstructing calculus. The cause ofthedistal ureteral obstructionwasunknown. There was sigmoid diverticulosis without inflammationand several small calcified uterine fibroids.Appears to be interval development of retroperitoneal adenopathy. Left periaortic lymph node measures 2 cm. There is a 2.6 cm right paratracheal node. There is probable bilateral iliac adenopathy. Adenopathy may be the cause of distal right ureteral obstruction. 04/05/2020:  PET scan revealed hypermetabolic prevascular lymph nodes (nodes up to 13 mm; SUV 4.2) new from comparison PET CT 11/08/2018 c/w new lymphoma recurrence ([place] 5). There was new hypermetabolic periaortic lymphadenopathy ([place] 5); most intense node was 2.2 cm between IVC and aorta at the level of the kidneys (SUV 5.7).   There was a  similar 1.6 cm left aorta node (SUV 3.7).  Spleen and marrow were normal. There was hydronephrosis of the RIGHT renal collecting system with stent in place.  10/27/2017:  Bone densityrevealed osteoporosiswith a T-score of -3.8 in the left forearm radius.  11/07/2019:  Bonedensityrevealed osteoporosis with a T score of -2.5 in the right femoral neck. 03/14/2020:  Right lower extremity duplex revealed no evidence of DVT. 04/10/2020:  Right lower extremity duplex revealed no DVT.  There was a 3.4 x 0.8 x 2.9 cm anechoic fluid collection with the right popliteal fossa compatible with a Baker's cyst.  04/10/2020:  Perfusion lung scan revealed very low probability for pulmonary embolism.  04/10/2020:  CXR revealed Ill-defined opacity in the right mid to upper lung which may reflect early bronchopneumonia.  04/12/2020:  CXR revealed chronic underlying lung changes. There was persistent patchy right upper lobe airspace opacity felt secondary to a small infiltrate or area of atelectasis.    Hospital Outpatient Visit on 04/13/2020  Component Date Value Ref Range Status  . Sodium 04/13/2020 132* 135 - 145 mmol/L Final  . Potassium 04/13/2020 3.6  3.5 - 5.1 mmol/L Final  . Chloride 04/13/2020 97* 98 - 111 mmol/L Final  . CO2 04/13/2020 21* 22 - 32 mmol/L Final  . Glucose, Bld 04/13/2020 105* 70 - 99 mg/dL Final   Glucose reference range applies only to samples taken after fasting for at least 8 hours.  . BUN 04/13/2020 25* 8 - 23 mg/dL Final  . Creatinine, Ser 04/13/2020 0.90  0.44 - 1.00 mg/dL Final  . Calcium 04/13/2020 8.5* 8.9 - 10.3 mg/dL Final  . GFR calc non Af Amer 04/13/2020 58* >60 mL/min Final  . GFR calc Af Amer 04/13/2020 >60  >60 mL/min Final  . Anion gap 04/13/2020 14  5 - 15 Final   Performed at [facility], [address], [place], [place] [address]  . WBC 04/13/2020 8.1  4.0 - 10.5 K/uL Final  . RBC 04/13/2020 3.33* 3.87 - 5.11 MIL/uL Final  . Hemoglobin  04/13/2020 10.6* 12.0 - 15.0 g/dL Final  . HCT 04/13/2020 31.0* 36.0 - 46.0 % Final  . MCV 04/13/2020 93.1  80.0 - 100.0 fL Final  . MCH 04/13/2020 31.8  26.0 - 34.0 pg Final  . MCHC 04/13/2020 34.2  30.0 - 36.0 g/dL Final  . RDW 04/13/2020 12.8  11.5 - 15.5 % Final  . Platelets 04/13/2020 283  150 - 400 K/uL Final  . nRBC 04/13/2020 0.0  0.0 - 0.2 % Final  . Neutrophils Relative % 04/13/2020 87  % Final  . Neutro Abs 04/13/2020 7.1  1.7 - 7.7 K/uL Final  . Lymphocytes Relative 04/13/2020 2  % Final  . Lymphs Abs 04/13/2020 0.2* 0.7 - 4.0 K/uL Final  . Monocytes Relative 04/13/2020 8  % Final  . Monocytes Absolute 04/13/2020 0.7  0.1 - 1.0 K/uL Final  . Eosinophils Relative 04/13/2020 1  % Final  . Eosinophils Absolute 04/13/2020 0.1  0.0 - 0.5 K/uL Final  . Basophils Relative 04/13/2020 1  % Final  . Basophils Absolute 04/13/2020 0.1  0.0 - 0.1 K/uL Final  . Immature Granulocytes 04/13/2020 1  % Final  . Abs Immature Granulocytes 04/13/2020 0.05  0.00 - 0.07 K/uL Final   Performed at [facility], [address] [place], [place] [address]  . Prothrombin Time 04/13/2020 12.5  11.4 - 15.2 seconds Final  . INR 04/13/2020 1.0  0.8 - 1.2 Final   Comment: (  NOTE) INR goal varies based on device and disease states. Performed at [facility], [address] [place], [place] [address]     Assessment:  [name] is a 86 y.o. female with a history of stage IIIA follicular non-Hodgkin's lymphoma. She presented in the summer of 2003 with an epigastric and left upper quadrant mass and left arm weakness with decreased mobility. Abdominal ultrasound revealed a large mass in the head of the pancreas with multiple enlarged lymph nodes and a large lobulated mass in the right retroperitoneal area. MRI of the left brachial plexus revealed a 2.5 cm soft tissue mass and abnormal lymph node along the left brachial plexus and in the supraclavicular region.  Cervical node  biopsyon 08/06/2002 revealed a grade I follicular non-Hodgkin's lymphoma. Bone marrowon 08/09/2002 was negative. Lumbar puncture was negative. She receivedLeukeran and Rituxan. As her arm pain and mobility was not relieved, she received local radiationto the left brachial plexus area. Leukeran continued through 02/28/2013 (discontinued secondary side effects).  She developed progressive disease in 10/2003. Patient received CVPchemotherapy beginning 01/02/2004. In 06/2004 she was switched to CNOP. She received 8 cycles completing on 01/15/2005. She began maintenance Rituxanon 04/23/2004 (4 weekly doses every 3 months for 2 years). Therapy completed on 09/13/2008.   Excision of a 1 cm right preauricular masson 10/04/2017 revealed a low grade follicular lymphoma. CT guided biopsyof the retroperitoneal nodal masson 11/20/2017 revealed grade I follicular lymphoma. Immunohistochemistry revealed positive for CD10, BCL-2, BCL-6, Ki67 (15-20%) and negative for cyclin D1 and CD5. Flow cytometry revealed a CD10+ clonal B-cell population.   She received 4 weekly cycles of Rituxan(12/24/2017 - 01/14/2018) and 3 cycles of maintenance Rituxan(03/24/2018 - 10/08/2018).  PET scanon 11/08/2018 revealed a 5.5 x 7.3 cm hypermetabolic (SUV 23.7) right retroperitoneal nodal mass and aortocaval lymph node. CT guided biopsyon 62/83/1517 confirmed follicular lymphoma, grade I.  She received radiation(IMRT) of 3000 cGy to the retroperitoneal mass from 01/11/2019 - 01/31/2019.  The following studies were negativeon 10/23/2017 and 04/03/2020: hepatitis B core antibody, hepatitis B surface antigen, hepatitis C antibody. G6PD assay was normal on 04/03/2020.  She presented with a 1 month history of abdominal discomfort. Creatinine has increased. She had new right sided hydroureteronephrosis.  Abdomen and pelvis CTon 03/21/2020 revealed moderate right hydroureteronephrosis which appearedto extend  to the L5 level, but withoutdefinite obstructing calculus. The cause ofthedistal ureteral obstructionwasunknown. There was sigmoid diverticulosis without inflammationand several small calcified uterine fibroids.Appears to be interval development of retroperitoneal adenopathy. Left periaortic lymph node measures 2 cm. There is a 2.6 cm right paratracheal node. There is probable bilateral iliac adenopathy. Adenopathy may be the cause of distal right ureteral obstruction.  PET scan on 04/05/2020 revealed hypermetabolic prevascular lymph nodes (nodes up to 13 mm; SUV 4.2) new from comparison PET CT 11/08/2018 c/w new lymphoma recurrence ([place] 5). There was new hypermetabolic periaortic lymphadenopathy ([place] 5); most intense node was 2.2 cm between IVC and aorta at the level of the kidneys (SUV 5.7).  There was a similar 1.6 cm left aorta node (SUV 3.7).  Spleen and marrow were normal. There was hydronephrosis of the RIGHT renal collecting system with stent in place  She underwent right ureteral stent placement on 03/29/2020 followed by external nephrostomy tube placement on 04/13/2020.  She hasright lower extremity edema. Right lower extremity duplex on 03/14/2020 and 04/10/2020 revealed no evidence of DVT.  Perfusion scan on 04/10/2020 revealed low probability of pulmonary embolism.  She was admitted to [facility] 08/15/2015 - 08/25/2015 with  bloody diarrhea then hemolytic uremic syndromerelated to E. coli 0157. She had a complicated course with acute renal failure and anemia. She was then in rehab for 3 weeks.   Bonedensityon 11/07/2019 revealed osteoporosis with a T score of -2.5 in the right femoral neck.She began every 6 month Proliaon 04/20/2018 (last01/03/2020).  Symptomatically, she has discomfort associated with the new right nephrostomy tube placement.  Exam reveals chronic right lower extremity edema.  Plan: 1.    Labs today:  CBC with diff, BMP, PT/INR. 2.    Stage IIIAfollicular non-Hodgkin's lymphoma Patient has been treated with several regimens in the past:                   CVP, CNOP, and Rituxan.                   Disease grew on maintenance Rituxan.       Patient is s/p IMRT to the retroperitoneal mass. Abdominal and pelvic pelvis CT on 03/21/2020 suggested retroperitoneal adenopathy although imaging was limited secondary to lack of IV contrast.   There was probable bilateral iliac adenopathy.  Adenopathy appeared to be the cause of her distal right ureteral obstruction PET scan on 04/05/2020 confirmed recurrent lymphoma in multiple sites.   No evidence of Richter's phenomenon in imaging (SUVs low).  Discuss tumor board on 04/12/2020.  Discuss plan for Revlimid and obinutuzimab [name]).       Revlimid 5 mg po q day on days 1-21 of cycles 1-6.   Obinutuzumab 1000 mg IV on days 1, 8, 15 of cycle #1 then 1000 mg on day 1 of cycles #2-6.  Anticipate maintenance treatment with   Revlimid po on days 1-21 of cycles 7-18.   Obinutuzumab 1000 mg IV on day 1 (odd numbered cycles) beginning with cycle #7.  Review dose of Revlimid may be increased if creatinine improves and well tolerated.   Discuss potential side effects of Revlimid.  Review potential side effects of obinutuzumab.   Discuss risk of reaction and premedications.   Discuss cycle #1 in [place] in case of reaction.  Anticipate Rxs for allopurinol, acyclovir, Septra per renal function on 04/18/2020.  Treatment is palliative.  Patient consents to treatment. 3. Right hydroureteronephrosis Adenopathy caused obstruction. Patient is s/p internal ureteral stent placement which did not relive obstruction.             Patient underwent right external stent placement today.  Internal stent will be removed on 04/18/2020.  Anticipate external stent will be temporary until adenopathy has resolved. 4. Right lower extremity  edema Etiology likely secondary to obstructive adenopathy in abdomen/pelvis limiting venous return. Duplex on 03/14/2020 and 04/10/2020 revealed no evidence of DVT. VQ scan on 03/21/2020 revealed no evidence of pulmonary embolism.  Anticipate initiation of baby aspirin or low dose Eliquis secondary to slow flow and risk of thrombosis.  Await follow-up improvement in renal function and hemostasis s/p nephrostomy tube placement prior to consideration of Eliquis.  5.   Dysphagia            Temporally related to oral intubation.            Etiology appears secondary to adenopathy near esophagus. 6.   Shortness of breath  VQ scan reveals low likelihood of pulmonary embolism.  CXR personally reviewed.  She appears to have atelectasis rather than pneumonia.   No evidence of aspiration, fever, cough.   Review with radiology.  Continue to monitor. 7.   RTC in  [place] on 04/16/2020 for labs and [name] (already scheduled). 8.   RTC in [place] on 05/10 and 05/17 for labs (CBC with diff, CMP, LDH, uric acid, TSH, free T4) and [place]. 9.   RTC in [place] in 1 week for MD assessment. 10.   RN to make 1st month of calendar treatment for patient.  I discussed the assessment and treatment plan with the patient.  The patient was provided an opportunity to ask questions and all were answered.  The patient agreed with the plan and demonstrated an understanding of the instructions.  The patient was advised to call back if the symptoms worsen or if the condition fails to improve as anticipated.  I provided 42 minutes (2:08 PM - 2:50 PM) of face-to-face time during this this encounter and > 50% was spent counseling as documented under my assessment and plan.   Lequita Asal, MD, PhD    04/13/2020, 2:08 PM  I, Heywood Footman, am acting as Education administrator for [employer]. Mike Gip, MD, PhD.  I, Terra Aveni C. Mike Gip, MD, have reviewed the above documentation for accuracy and  completeness, and I agree with the above.

## 2020-04-12 NOTE — Telephone Encounter (Signed)
Spoke with the patient to inform her that she needed a f/u chest x ray, per Dr Mike Gip to see if she has a cough or a fever? [name] The patient reply No. I informed Dr Mike Gip /  orders has been put in for the patient to have a f/u chest x ray today. I have informed the patient about the x ray and the location. she was agreeable and understanding.

## 2020-04-13 ENCOUNTER — Ambulatory Visit
Admission: RE | Admit: 2020-04-13 | Discharge: 2020-04-13 | Disposition: A | Discharge status: Home / Self Care | Payer: Medicare Other | Source: Ambulatory Visit | Attending: Physician Assistant | Admitting: Physician Assistant

## 2020-04-13 ENCOUNTER — Other Ambulatory Visit: Payer: Medicare Other

## 2020-04-13 ENCOUNTER — Ambulatory Visit: Payer: Medicare Other | Admitting: Hematology and Oncology

## 2020-04-13 ENCOUNTER — Inpatient Hospital Stay: Payer: Medicare Other

## 2020-04-13 ENCOUNTER — Encounter: Payer: Self-pay | Admitting: Hematology and Oncology

## 2020-04-13 ENCOUNTER — Ambulatory Visit: Payer: Medicare Other

## 2020-04-13 ENCOUNTER — Inpatient Hospital Stay (HOSPITAL_BASED_OUTPATIENT_CLINIC_OR_DEPARTMENT_OTHER): Payer: Medicare Other | Admitting: Hematology and Oncology

## 2020-04-13 ENCOUNTER — Other Ambulatory Visit: Payer: Self-pay

## 2020-04-13 VITALS — BP 137/60 | HR 77 | Temp 96.3°F | Resp 18 | Ht 66.0 in | Wt 115.0 lb

## 2020-04-13 DIAGNOSIS — N1330 Unspecified hydronephrosis: Secondary | ICD-10-CM

## 2020-04-13 DIAGNOSIS — R7989 Other specified abnormal findings of blood chemistry: Secondary | ICD-10-CM

## 2020-04-13 DIAGNOSIS — C8208 Follicular lymphoma grade I, lymph nodes of multiple sites: Secondary | ICD-10-CM | POA: Diagnosis not present

## 2020-04-13 DIAGNOSIS — R918 Other nonspecific abnormal finding of lung field: Secondary | ICD-10-CM

## 2020-04-13 DIAGNOSIS — Z7189 Other specified counseling: Secondary | ICD-10-CM | POA: Diagnosis not present

## 2020-04-13 DIAGNOSIS — C8298 Follicular lymphoma, unspecified, lymph nodes of multiple sites: Secondary | ICD-10-CM | POA: Diagnosis not present

## 2020-04-13 HISTORY — PX: IR NEPHROSTOMY PLACEMENT RIGHT: IMG6064

## 2020-04-13 LAB — CBC WITH DIFFERENTIAL/PLATELET
Abs Immature Granulocytes: 0.05 10*3/uL (ref 0.00–0.07)
Basophils Absolute: 0.1 10*3/uL (ref 0.0–0.1)
Basophils Relative: 1 %
Eosinophils Absolute: 0.1 10*3/uL (ref 0.0–0.5)
Eosinophils Relative: 1 %
HCT: 31 % — ABNORMAL LOW (ref 36.0–46.0)
Hemoglobin: 10.6 g/dL — ABNORMAL LOW (ref 12.0–15.0)
Immature Granulocytes: 1 %
Lymphocytes Relative: 2 %
Lymphs Abs: 0.2 10*3/uL — ABNORMAL LOW (ref 0.7–4.0)
MCH: 31.8 pg (ref 26.0–34.0)
MCHC: 34.2 g/dL (ref 30.0–36.0)
MCV: 93.1 fL (ref 80.0–100.0)
Monocytes Absolute: 0.7 10*3/uL (ref 0.1–1.0)
Monocytes Relative: 8 %
Neutro Abs: 7.1 10*3/uL (ref 1.7–7.7)
Neutrophils Relative %: 87 %
Platelets: 283 10*3/uL (ref 150–400)
RBC: 3.33 MIL/uL — ABNORMAL LOW (ref 3.87–5.11)
RDW: 12.8 % (ref 11.5–15.5)
WBC: 8.1 10*3/uL (ref 4.0–10.5)
nRBC: 0 % (ref 0.0–0.2)

## 2020-04-13 LAB — BASIC METABOLIC PANEL
Anion gap: 14 (ref 5–15)
BUN: 25 mg/dL — ABNORMAL HIGH (ref 8–23)
CO2: 21 mmol/L — ABNORMAL LOW (ref 22–32)
Calcium: 8.5 mg/dL — ABNORMAL LOW (ref 8.9–10.3)
Chloride: 97 mmol/L — ABNORMAL LOW (ref 98–111)
Creatinine, Ser: 0.9 mg/dL (ref 0.44–1.00)
GFR calc Af Amer: >60 mL/min (ref >60)
GFR calc non Af Amer: 58 mL/min — ABNORMAL LOW (ref >60)
Glucose, Bld: 105 mg/dL — ABNORMAL HIGH (ref 70–99)
Potassium: 3.6 mmol/L (ref 3.5–5.1)
Sodium: 132 mmol/L — ABNORMAL LOW (ref 135–145)

## 2020-04-13 LAB — PROTIME-INR
INR: 1 (ref 0.8–1.2)
Prothrombin Time: 12.5 seconds (ref 11.4–15.2)

## 2020-04-13 MED ORDER — IODIXANOL 320 MG/ML IV SOLN
50.0000 mL | Freq: Once | INTRAVENOUS | Status: AC | PRN
  Administered 2020-04-13: 10 mL

## 2020-04-13 MED ORDER — MIDAZOLAM HCL 2 MG/2ML IJ SOLN
INTRAMUSCULAR | Status: AC | PRN
  Administered 2020-04-13: 0.5 mg via INTRAVENOUS

## 2020-04-13 MED ORDER — FENTANYL CITRATE (PF) 100 MCG/2ML IJ SOLN
INTRAMUSCULAR | Status: AC | PRN
  Administered 2020-04-13: 25 ug via INTRAVENOUS

## 2020-04-13 MED ORDER — SODIUM CHLORIDE 0.9 % IV SOLN: INTRAVENOUS | Status: DC

## 2020-04-13 MED ORDER — CEFAZOLIN SODIUM-DEXTROSE 2-4 GM/100ML-% IV SOLN: 2.0000 g | INTRAVENOUS | Status: DC

## 2020-04-13 MED ORDER — MIDAZOLAM HCL 2 MG/2ML IJ SOLN
INTRAMUSCULAR | Status: AC
  Filled 2020-04-13: qty 2

## 2020-04-13 MED ORDER — CEFAZOLIN SODIUM-DEXTROSE 2-4 GM/100ML-% IV SOLN
INTRAVENOUS | Status: AC
  Filled 2020-04-13: qty 100

## 2020-04-13 MED ORDER — FENTANYL CITRATE (PF) 100 MCG/2ML IJ SOLN
INTRAMUSCULAR | Status: AC
  Filled 2020-04-13: qty 2

## 2020-04-13 NOTE — Patient Instructions (Addendum)
Obinutuzumab injection What is this medicine? OBINUTUZUMAB (OH bi nue TOOZ ue mab) is a monoclonal antibody. It is used to treat chronic lymphocytic leukemia (CLL) and a type of non-Hodgkin lymphoma (NHL), follicular lymphoma. This medicine may be used for other purposes; ask your health care provider or pharmacist if you have questions. COMMON BRAND NAME(S): GAZYVA What should I tell my health care provider before I take this medicine? They need to know if you have any of these conditions:  infection (especially a virus infection such as hepatitis B virus)  lung or breathing disease  heart disease  take medicines that treat or prevent blood clots  an unusual or allergic reaction to obinutuzumab, other medicines, foods, dyes, or preservatives  pregnant or trying to get pregnant  breast-feeding How should I use this medicine? This medicine is for infusion into a vein. It is given by a health care professional in a hospital or clinic setting. Talk to your pediatrician regarding the use of this medicine in children. Special care may be needed. Overdosage: If you think you have taken too much of this medicine contact a poison control center or emergency room at once. NOTE: This medicine is only for you. Do not share this medicine with others. What if I miss a dose? Keep appointments for follow-up doses as directed. It is important not to miss your dose. Call your doctor or health care professional if you are unable to keep an appointment. What may interact with this medicine?  live virus vaccines This list may not describe all possible interactions. Give your health care provider a list of all the medicines, herbs, non-prescription drugs, or dietary supplements you use. Also tell them if you smoke, drink alcohol, or use illegal drugs. Some items may interact with your medicine. What should I watch for while using this medicine? Report any side effects that you notice during your  treatment right away, such as changes in your breathing, fever, chills, dizziness or lightheadedness. These effects are more common with the first dose. Visit your prescriber or health care professional for checks on your progress. You will need to have regular blood work. Report any other side effects. The side effects of this medicine can continue after you finish your treatment. Continue your course of treatment even though you feel ill unless your doctor tells you to stop. Call your doctor or health care professional for advice if you get a fever, chills or sore throat, or other symptoms of a cold or flu. Do not treat yourself. This drug decreases your body's ability to fight infections. Try to avoid being around people who are sick. This medicine may increase your risk to bruise or bleed. Call your doctor or health care professional if you notice any unusual bleeding. Do not become pregnant while taking this medicine or for 6 months after stopping it. Women should inform their doctor if they wish to become pregnant or think they might be pregnant. There is a potential for serious side effects to an unborn child. Talk to your health care professional or pharmacist for more information. Do not breast-feed an infant while taking this medicine or for 6 months after stopping it. What side effects may I notice from receiving this medicine? Side effects that you should report to your doctor or health care professional as soon as possible:  allergic reactions like skin rash, itching or hives, swelling of the face, lips, or tongue  breathing problems  changes in vision  chest pain or chest  tightness  confusion  dizziness  loss of balance or coordination  low blood counts - this medicine may decrease the number of white blood cells, red blood cells and platelets. You may be at increased risk for infections and bleeding.  signs of decreased platelets or bleeding - bruising, pinpoint red spots on  the skin, black, tarry stools, blood in the urine  signs of infection - fever or chills, cough, sore throat, pain or trouble passing urine  signs and symptoms of liver injury like dark yellow or brown urine; general ill feeling or flu-like symptoms; light-colored stools; loss of appetite; nausea; right upper belly pain; unusually weak or tired; yellowing of the eyes or skin  trouble speaking or understanding  trouble walking  vomiting Side effects that usually do not require medical attention (report to your doctor or health care professional if they continue or are bothersome):  constipation  joint pain  muscle pain This list may not describe all possible side effects. Call your doctor for medical advice about side effects. You may report side effects to FDA at 1-800-FDA-1088. Where should I keep my medicine? This drug is only given in a hospital or clinic and will not be stored at home. NOTE: This sheet is a summary. It may not cover all possible information. If you have questions about this medicine, talk to your doctor, pharmacist, or health care provider.  2020 Elsevier/Gold Standard (2019-03-15 15:34:53)   Lenalidomide Oral Capsules What is this medicine? LENALIDOMIDE (len a LID oh mide) is a chemotherapy drug that targets specific proteins within cancer cells and stops the cancer cell from growing. It is used to treat multiple myeloma, certain types of lymphoma, and some myelodysplastic syndromes that cause severe anemia requiring blood transfusions. This medicine may be used for other purposes; ask your health care provider or pharmacist if you have questions. COMMON BRAND NAME(S): Revlimid What should I tell my health care provider before I take this medicine? They need to know if you have any of these conditions:  blood clots in the legs or the lungs  high blood pressure  high cholesterol  infection  irregular monthly periods or menstrual cycles  kidney  disease  liver disease  smoke tobacco  thyroid disease  an unusual or allergic reaction to lenalidomide, thalidomide, other medicines, foods, dyes, or preservatives  pregnant or trying to get pregnant  breast-feeding How should I use this medicine? Take this medicine by mouth with a glass of water. Follow the directions on the prescription label. Do not cut, crush, or chew this medicine. Take your medicine at regular intervals. Do not take it more often than directed. Do not stop taking except on your doctor's advice. A MedGuide will be given with each prescription and refill. Read this guide carefully each time. The MedGuide may change frequently. Talk to your pediatrician regarding the use of this medicine in children. Special care may be needed. Overdosage: If you think you have taken too much of this medicine contact a poison control center or emergency room at once. NOTE: This medicine is only for you. Do not share this medicine with others. What if I miss a dose? If you miss a dose, take it as soon as you can. If your next dose is to be taken in less than 12 hours, then do not take the missed dose. Take the next dose at your regular time. Do not take double or extra doses. What may interact with this medicine? This medicine may interact  with the following medications:  digoxin  medicines that increase the risk of thrombosis like estrogens or erythropoietic agents (e.g., epoetin alfa and darbepoetin alfa)  warfarin This list may not describe all possible interactions. Give your health care provider a list of all the medicines, herbs, non-prescription drugs, or dietary supplements you use. Also tell them if you smoke, drink alcohol, or use illegal drugs. Some items may interact with your medicine. What should I watch for while using this medicine? You may need blood work done while you are taking this medicine. This medicine may cause serious skin reactions. They can happen weeks  to months after starting the medicine. Contact your health care provider right away if you notice fevers or flu-like symptoms with a rash. The rash may be red or purple and then turn into blisters or peeling of the skin. Or, you might notice a red rash with swelling of the face, lips or lymph nodes in your neck or under your arms. This medicine is available only through a special program. Doctors, pharmacies, and patients must meet all of the conditions of the program. Your health care provider will help you get signed up with the program if you need this medicine. Through the program you will only receive up to a 28 day supply of the medicine at one time. You will need a new prescription for each refill. This medicine can cause birth defects. Do not get pregnant while taking this drug. Females with child-bearing potential will need to have 2 negative pregnancy tests before starting this medicine. Pregnancy testing must be done every 2 to 4 weeks as directed while taking this medicine. Use 2 reliable forms of birth control together while you are taking this medicine and for 4 weeks after you stop taking this medicine. If you think that you might be pregnant talk to your doctor right away. Do not breast-feed an infant while taking this medicine. Men must use a latex condom during sexual contact with a woman while taking this medicine and for 4 weeks after you stop taking this medicine. A latex condom is needed even if you have had a vasectomy. Contact your doctor right away if your partner becomes pregnant. Do not donate sperm while taking this medicine and for 4 weeks after you stop taking this medicine. Do not give blood while taking the medicine and for 4 weeks after completion of treatment to avoid exposing pregnant women to the medicine through the donated blood. Talk to your doctor about your risk of cancer. You may be more at risk for certain types of cancers if you take this medicine. What side effects  may I notice from receiving this medicine? Side effects that you should report to your doctor or health care professional as soon as possible:  allergic reactions like skin rash, itching or hives, swelling of the face, lips, or tongue  breathing problems  chest pain or tightness  fast, irregular heartbeat  feeling faint  low blood counts - this medicine may decrease the number of white blood cells, red blood cells and platelets. You may be at increased risk for infections and bleeding.  rash, fever, and swollen lymph nodes  redness, blistering, peeling or loosening of the skin, including inside the mouth  seizures  signs and symptoms of bleeding such as bloody or black, tarry stools; red or dark-brown urine; spitting up blood or brown material that looks like coffee grounds; red spots on the skin; unusual bruising or bleeding from the eye,  gums, or nose  signs and symptoms of a blood clot such as breathing problems; changes in vision; chest pain; severe, sudden headache; pain, swelling, warmth in the leg; trouble speaking; sudden numbness or weakness of the face, arm or leg  signs and symptoms of liver injury like dark yellow or brown urine; general ill feeling or flu-like symptoms; light-colored stools; loss of appetite; nausea; right upper belly pain; unusually weak or tired; yellowing of the eyes or skin  signs and symptoms of a stroke like changes in vision; confusion; trouble speaking or understanding; severe headaches; sudden numbness or weakness of the face, arm or leg; trouble walking; dizziness; loss of balance or coordination  sweating  vomiting Side effects that usually do not require medical attention (report to your doctor or health care professional if they continue or are bothersome):  constipation  cough  diarrhea  joint pain  muscle cramps  swelling of the arms, legs, or skin  tiredness  trouble sleeping This list may not describe all possible side  effects. Call your doctor for medical advice about side effects. You may report side effects to FDA at 1-800-FDA-1088. Where should I keep my medicine? Keep out of the reach of children. Store at room temperature between 15 and 30 degrees C (59 and 86 degrees F). Throw away any unused medicine after the expiration date. NOTE: This sheet is a summary. It may not cover all possible information. If you have questions about this medicine, talk to your doctor, pharmacist, or health care provider.  2020 Elsevier/Gold Standard (2019-03-04 15:09:17)

## 2020-04-13 NOTE — Consult Note (Signed)
Chief Complaint: Persistent hydronephrosis and in the presence of a double J stent  Referring Physician(s): Vaillancourt,Samantha  Supervising Physician: Corrie Mckusick  Patient Status: [facility] - Out-pt  History of Present Illness: [name] is a 86 y.o. female istory of follicular lymphoma  With right sided hydronephrosis s/p double J stent on 4.15.21. Team is requesting nephrostomy tube placement right sided due to persistent hydronephrosis and AKI in the presence of a stent.  Per note from Bonnye Fava PA dated 4.26.21 Will place referral to IR today and schedule her for cysto stent removal with Dr. Erlene Quan following.   Past Medical History:  Diagnosis Date  . Arthritis    osteoporosis also. prolia treatments every 6 months  . Baker cyst, right 03/2020  . Cancer [facility]) 2013   Non-Hodgkin Lymphoma with chemo and rad tx  . Complication of anesthesia   . Dehydration    low sodium levels  . Dysrhythmia   . Essential tremor   . Follicular lymphoma grade I ([place]) 08/06/2002  . GERD (gastroesophageal reflux disease)   . Hearing loss of both ears    wears bilateral hearing aides  . 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 History of blood transfusion   . History of chemotherapy   . History of radiation therapy   . Hypertension   . Labile blood pressure   . Meniere's disease    with bilateral hearing loss  . Mini stroke [facility])    summer 2014  . Osteoporosis   . PONV (postoperative nausea and vomiting)   . Renal artery aneurysm ([place])   . Stroke [facility]) 2015   possible mini stroke. loss of conciousness while sitting at table. could not diagnose  . Syncope     Past Surgical History:  Procedure Laterality Date  . APPENDECTOMY    . BONE MARROW BIOPSY  08/09/2002  . BREAST LUMPECTOMY  1950s   benign  . CATARACT EXTRACTION  03/2009  . COLONOSCOPY    . CYSTOSCOPY WITH STENT PLACEMENT Right 03/29/2020   Procedure: CYSTOSCOPY WITH STENT  PLACEMENT;  Surgeon: Hollice Espy, MD;  Location: [facility] ORS;  Service: Urology;  Laterality: Right;  . ESOPHAGOGASTRODUODENOSCOPY (EGD) WITH PROPOFOL N/A 09/23/2019   Procedure: ESOPHAGOGASTRODUODENOSCOPY (EGD) WITH PROPOFOL;  Surgeon: Jonathon Bellows, MD;  Location: [facility] ENDOSCOPY;  Service: Gastroenterology;  Laterality: N/A;  . FRACTURE SURGERY Left    wrist  . LYMPH NODE BIOPSY  08/07/2002   cervical  . TUBAL LIGATION      Allergies: Lisinopril and Primidone  Medications: Prior to Admission medications   Medication Sig Start Date End Date Taking? Authorizing Provider  Acetaminophen (TYLENOL ARTHRITIS EXT RELIEF PO) Take 1 tablet by mouth daily as needed (pain).    [provider]  AZELASTINE & FLUTICASONE NA 1 spray each nase twice daily.    [provider]  calcium-vitamin D (CALCIUM 500/D) 500-200 MG-UNIT tablet Take 1 tablet by mouth 2 (two) times daily.     [provider]  denosumab (PROLIA) 60 MG/ML SOSY injection Inject 60 mg into the skin every 6 (six) months.     [provider]  DEXILANT 60 MG capsule TAKE ONE CAPSULE BY MOUTH ONE TIME DAILY  03/20/20   Jonathon Bellows, MD  Flax Oil-Fish Oil-Borage Oil (FISH OIL-FLAX OIL-BORAGE OIL) CAPS Take 1,000-1,400 mg by mouth daily.    [provider]  Glucosamine HCl-MSM (GLUCOSAMINE-MSM PO) Take 1 tablet by mouth daily.     [provider]  HYDROcodone-acetaminophen (NORCO) 5-325 MG tablet 1/2 to 1 tablet every 4 hours as needed for pain. 04/11/20   Lequita Asal, MD  lenalidomide (REVLIMID) 5 MG capsule Take 1 capsule (5 mg total) by mouth daily. Take for 21 days, then hold for 7 days. Repeat every 28 days. 04/11/20   Lequita Asal, MD  Magnesium Citrate 125 MG CAPS Take 1 tablet by mouth daily.  12/05/19   [provider]  melatonin 5 MG TABS Take 5 mg by mouth at bedtime as needed.     [provider]  Multiple Vitamin (MULTIVITAMIN) tablet Take 1 tablet by  mouth daily.    [provider]  Multiple Vitamins-Minerals (PRESERVISION AREDS 2 PO) Take 1 tablet by mouth daily.     [provider]  naproxen sodium (ALEVE) 220 MG tablet Take 220 mg by mouth daily as needed.     [provider]  oxybutynin (DITROPAN) 5 MG tablet Take 1 tablet (5 mg total) by mouth every 8 (eight) hours as needed for bladder spasms. 03/29/20   Hollice Espy, MD  polyethylene glycol (MIRALAX / GLYCOLAX) 17 g packet Take 17 g by mouth daily.    [provider]  predniSONE (DELTASONE) 10 MG tablet Take 3 tablets (30 mg total) by mouth daily with breakfast. Patient not taking: Reported on 04/10/2020 04/03/20   Lequita Asal, MD  Probiotic Product (PROBIOTIC DAILY PO) Take 2 tablets by mouth daily.     [provider]  tamsulosin (FLOMAX) 0.4 MG CAPS capsule Take 1 capsule (0.4 mg total) by mouth daily. 03/29/20   Hollice Espy, MD  triamcinolone cream (KENALOG) 0.1 % Apply 1 application topically as needed.     [provider]  Turmeric Curcumin 500 MG CAPS Take 1 capsule by mouth daily.     [provider]     Family History  Problem Relation Age of Onset  . Cancer Father        Throat  . Cancer Sister 56       Colon    Social History   Socioeconomic History  . Marital status: Married    Spouse name: Ed  . Number of children: Not on file  . Years of education: Not on file  . Highest education level: Not on file  Occupational History  . Occupation: Automotive engineer    Comment: retired  Tobacco Use  . Smoking status: Never Smoker  . Smokeless tobacco: Never Used  Substance and Sexual Activity  . Alcohol use: No    Alcohol/week: 0.0 standard drinks    Comment: rare  . Drug use: No  . Sexual activity: Not Currently  Other Topics Concern  . Not on file  Social History Narrative   Lives with husband.   Lives at [place] Strain:   . Difficulty of Paying Living Expenses:   Food Insecurity:   . Worried About Charity fundraiser in the Last Year:   . Arboriculturist in the Last Year:   Transportation Needs:   . Film/video editor (Medical):   [name] Lack of Transportation (Non-Medical):   Physical Activity:   . Days of Exercise per Week:   . Minutes of Exercise per Session:   Stress:   . Feeling of Stress :   Social Connections:   . Frequency of Communication with Friends and Family:   .  Frequency of Social Gatherings with Friends and Family:   . Attends Religious Services:   . Active Member of Clubs or Organizations:   . Attends Archivist Meetings:   [name] Marital Status:     Review of Systems: A 12 point ROS discussed and pertinent positives are indicated in the HPI above.  All other systems are negative.  Review of Systems  Constitutional: Negative for fatigue and fever.  HENT: Negative for congestion.   Respiratory: Negative for cough and shortness of breath.   Gastrointestinal: Negative for abdominal pain, diarrhea, nausea and vomiting.  Genitourinary:       Right flank pain    Vital Signs: BP (!) 143/66   Pulse 75   Temp 98.7 F (37.1 C) (Oral)   Resp 17   SpO2 97%   Physical Exam Vitals and nursing note reviewed.  Constitutional:      Appearance: She is well-developed.  HENT:     Head: Normocephalic and atraumatic.  Eyes:     Conjunctiva/sclera: Conjunctivae normal.  Cardiovascular:     Rate and Rhythm: Normal rate and regular rhythm.     Heart sounds: Normal heart sounds.  Pulmonary:     Effort: Pulmonary effort is normal.     Breath sounds: Normal breath sounds.  Musculoskeletal:        General: Normal range of motion.     Cervical back: Normal range of motion.  Skin:    General: Skin is warm.  Neurological:     Mental Status: She is alert and oriented to person, place, and time.     Imaging: CT ABDOMEN PELVIS WO CONTRAST  Addendum Date:  03/22/2020   ADDENDUM REPORT: 03/22/2020 08:56 ADDENDUM: Upon further review, there does appear to be the interval development of retroperitoneal adenopathy, although evaluation is limited due the lack of intravenous contrast. Left periaortic lymph node measures 2 cm. 2.6 cm right paratracheal lymph node is noted. There is probable bilateral iliac adenopathy is well, although evaluation is limited due to the lack of intravenous contrast. As noted in the impression, CT scan with intravenous and oral contrast is recommended for further evaluation if the patient can tolerate intravenous contrast. This adenopathy may be the cause of distal right ureteral obstruction. Electronically Signed   By: Marijo Conception M.D.   On: 03/22/2020 08:56   Result Date: 03/22/2020 CLINICAL DATA:  Right-sided abdominal pain. EXAM: CT ABDOMEN AND PELVIS WITHOUT CONTRAST TECHNIQUE: Multidetector CT imaging of the abdomen and pelvis was performed following the standard protocol without IV contrast. COMPARISON:  September 12, 2019. FINDINGS: Lower chest: No acute abnormality. Hepatobiliary: No focal liver abnormality is seen. No gallstones, gallbladder wall thickening, or biliary dilatation. Pancreas: Unremarkable. No pancreatic ductal dilatation or surrounding inflammatory changes. Spleen: Calcified splenic granulomata are noted. Adrenals/Urinary Tract: Adrenal glands appear normal. Left kidney and ureter are unremarkable. Moderate right hydroureteronephrosis is noted which appears to extend to the L5 level, but no definite obstructing calculus is noted. Urinary bladder is decompressed. Stomach/Bowel: Stomach is unremarkable. There is no evidence of bowel obstruction or inflammation. Stool is seen throughout the colon. Sigmoid diverticulosis is noted without inflammation. The appendix is not clearly visualized, but no inflammation is noted in the right lower quadrant. Vascular/Lymphatic: Aortic atherosclerosis. No enlarged abdominal or  pelvic lymph nodes. Reproductive: Several small calcified uterine fibroids may be present. No definite adnexal abnormality is noted. Other: No abdominal wall hernia or abnormality. No abdominopelvic ascites. Musculoskeletal: No acute or significant osseous findings.  IMPRESSION: 1. Moderate right hydroureteronephrosis is noted which appears to extend to the L5 level, but no definite obstructing calculus is noted. Cause of distal ureteral obstruction is unknown. CT scan with intravenous contrast may be performed for further evaluation. 2. Sigmoid diverticulosis without inflammation. 3. Several small calcified uterine fibroids. Aortic Atherosclerosis (ICD10-I70.0). Electronically Signed: By: Marijo Conception M.D. On: 03/21/2020 15:51   DG Chest 2 View  Result Date: 04/12/2020 CLINICAL DATA:  Shortness of breath and productive cough. EXAM: CHEST - 2 VIEW COMPARISON:  04/10/2020 FINDINGS: The cardiac silhouette, mediastinal and hilar contours are within normal limits and stable. Chronic pulmonary scarring changes with emphysema and areas of bronchiectasis. Small right upper lobe patchy airspace opacity could reflect a small infiltrate or area of atelectasis. No pleural effusions or worrisome pulmonary lesions. The bony thorax is intact. IMPRESSION: 1. Chronic underlying lung changes. 2. Persistent patchy right upper lobe airspace opacity, small infiltrate or area of atelectasis. Electronically Signed   By: Marijo Sanes M.D.   On: 04/12/2020 15:39   DG Chest 2 View  Result Date: 04/10/2020 CLINICAL DATA:  86 year old female with increasing shortness of breath for 1 week. EXAM: CHEST - 2 VIEW COMPARISON:  Chest x-ray 11/26/2013. FINDINGS: Lung volumes are normal. Emphysematous changes are noted throughout the lungs. Ill-defined opacity in the right mid to upper lung, new compared to the prior examination. No acute consolidative airspace disease. No pleural effusions. No evidence of pulmonary edema. No pneumothorax.  Heart size is normal. Upper mediastinal contours are within normal limits. IMPRESSION: 1. Ill-defined opacity in the right mid to upper lung which may reflect early bronchopneumonia. Followup PA and lateral chest X-ray is recommended in 3-4 weeks following trial of antibiotic therapy to ensure resolution and exclude underlying malignancy. 2. Emphysema. Electronically Signed   By: Vinnie Langton M.D.   On: 04/10/2020 20:44   NM Pulmonary Perfusion  Result Date: 04/10/2020 CLINICAL DATA:  Shortness of breath 1 week. EXAM: NUCLEAR MEDICINE PERFUSION LUNG SCAN TECHNIQUE: Perfusion images were obtained in multiple projections after intravenous injection of radiopharmaceutical. Ventilation scans intentionally deferred if perfusion scan and chest x-ray adequate for interpretation during COVID 19 epidemic. RADIOPHARMACEUTICALS:  4.35 mCi Tc-69mMAA IV COMPARISON:  Chest x-ray today. FINDINGS: Examination demonstrates evidence of patient's known elevated left hemidiaphragm. Subtle nonsegmental streaky decreased uptake in the perihilar regions likely due in part to atelectasis. Possible small peripheral perfusion defect over the left apex and right upper lobe. No other peripheral segmental perfusion defects. IMPRESSION: Very low probability for pulmonary embolism. Electronically Signed   By: DMarin OlpM.D.   On: 04/10/2020 14:02   Ultrasound renal complete  Result Date: 04/07/2020 CLINICAL DATA:  Known right hydronephrosis. EXAM: RENAL / URINARY TRACT ULTRASOUND COMPLETE COMPARISON:  CT scan March 21, 2020 FINDINGS: Right Kidney: Renal measurements: 10.1 x 5.2 x 5.6 cm = volume: 152.9 mL. There is cortical thinning. Moderate to severe hydronephrosis is again identified. The nephroureteral stent is identified. Left Kidney: Renal measurements: 10 x 3.7 x 4.6 cm = volume: 88 mL. Echogenicity within normal limits. No mass or hydronephrosis visualized. Bladder: The nephroureteral stent is seen within the bladder. The  bladder is poorly distended. Other: None. IMPRESSION: 1. Nephroureteral stent extending from the region of the right renal pelvis to the bladder. Continued moderate to severe right hydronephrosis and cortical thinning. Electronically Signed   By: DDorise BullionIII M.D   On: 04/07/2020 20:04   NM PET Image Restag (PS) Skull Base To  Thigh  Result Date: 04/05/2020 CLINICAL DATA:  Subsequent treatment strategy for grade 1 lymphoma. Follicular lymphoma. EXAM: NUCLEAR MEDICINE PET SKULL BASE TO THIGH TECHNIQUE: 6.3 mCi F-18 FDG was injected intravenously. Full-ring PET imaging was performed from the skull base to thigh after the radiotracer. CT data was obtained and used for attenuation correction and anatomic localization. Fasting blood glucose: 108 mg/dl COMPARISON:  CT 03/21/2020, PET-CT 11/08/2018 FINDINGS: Mediastinal blood pool activity: SUV max 166 Liver activity: SUV max 227 NECK: No hypermetabolic lymph nodes in the neck. Incidental CT findings: none CHEST: Cluster of lymph nodes in the prevascular space with hypermetabolic activity (SUV max equal 4.2). Activity greater than liver. Individually nodes measure up to 13 mm short axis (image 77/5). Incidental CT findings: Bronchiectasis in the LEFT upper lobe with some consolidation similar prior. Thickening along the LEFT superior oblique fissure is new but without significant metabolic activity (image 15/) ABDOMEN/PELVIS: Hypermetabolic periaortic retroperitoneal lymph nodes. The most intense lymph node position between the IVC and the aorta at the level the kidneys with SUV max equal 5.7. This node measures 22 cm (image 152/5). A similar lymph node LEFT aorta measuring 1.6 cm with SUV max equal 3.7. these retroperitoneal lymph nodes have metabolic activity greater than liver activity (SUV max equal 2.3). NO Clear hypermetabolic pelvic lymph nodes. No hypermetabolic inguinal nodes. Incidental CT findings: RIGHT ureteral stent in place with moderate  hydronephrosis. Or renal excretion on the RIGHT compared to the LEFT SKELETON: No focal hypermetabolic activity to suggest skeletal metastasis. Incidental CT findings: none IMPRESSION: 1. Hypermetabolic prevascular lymph nodes new from comparison PET CT 11/08/2018 consistent with new lymphoma recurrence ([place]). 2. New hypermetabolic periaortic lymphadenopathy ([place]) 3. Normal spleen 4. Normal marrow. 5. Hydronephrosis of the RIGHT renal collecting system with stent in place. Electronically Signed   By: Suzy Bouchard M.D.   On: 04/05/2020 15:38   US Venous Img Lower Unilateral Right  Addendum Date: 04/10/2020   ADDENDUM REPORT: 04/10/2020 13:37 ADDENDUM: Incidentally noted approximately 3.4 x 0.8 x 2.9 cm anechoic fluid collection with the right popliteal fossa compatible with a Baker's cyst. Electronically Signed   By: Sandi Mariscal M.D.   On: 04/10/2020 13:37   Result Date: 04/10/2020 CLINICAL DATA:  Right lower extremity pain and edema. History of lymphoma. Evaluate for DVT. EXAM: RIGHT LOWER EXTREMITY VENOUS DOPPLER ULTRASOUND TECHNIQUE: Gray-scale sonography with graded compression, as well as color Doppler and duplex ultrasound were performed to evaluate the lower extremity deep venous systems from the level of the common femoral vein and including the common femoral, femoral, profunda femoral, popliteal and calf veins including the posterior tibial, peroneal and gastrocnemius veins when visible. The superficial great saphenous vein was also interrogated. Spectral Doppler was utilized to evaluate flow at rest and with distal augmentation maneuvers in the common femoral, femoral and popliteal veins. COMPARISON:  Right lower extremity venous Doppler ultrasound-03/14/2020. FINDINGS: Contralateral Common Femoral Vein: Respiratory phasicity is normal and symmetric with the symptomatic side. No evidence of thrombus. Normal compressibility. Common Femoral Vein: No evidence of thrombus. Normal  compressibility, respiratory phasicity and response to augmentation. Saphenofemoral Junction: No evidence of thrombus. Normal compressibility and flow on color Doppler imaging. Profunda Femoral Vein: No evidence of thrombus. Normal compressibility and flow on color Doppler imaging. Femoral Vein: No evidence of thrombus. Normal compressibility, respiratory phasicity and response to augmentation. Popliteal Vein: No evidence of thrombus. Normal compressibility, respiratory phasicity and response to augmentation. Calf Veins: No evidence of thrombus. Normal compressibility and  flow on color Doppler imaging. Superficial Great Saphenous Vein: No evidence of thrombus. Normal compressibility. Venous Reflux:  None. Other Findings:  None. IMPRESSION: No evidence of DVT within the right lower extremity. Electronically Signed: By: Sandi Mariscal M.D. On: 04/10/2020 12:40   US Venous Img Lower Unilateral Right (DVT)  Result Date: 03/14/2020 CLINICAL DATA:  RIGHT leg swelling for 1 week question deep venous thrombosis EXAM: RIGHT LOWER EXTREMITY VENOUS DOPPLER ULTRASOUND TECHNIQUE: Gray-scale sonography with compression, as well as color and duplex ultrasound, were performed to evaluate the deep venous system(s) from the level of the common femoral vein through the popliteal and proximal calf veins. COMPARISON:  None FINDINGS: VENOUS Normal compressibility of the common femoral, superficial femoral, and popliteal veins, as well as the visualized calf veins. Visualized portions of profunda femoral vein and great saphenous vein unremarkable. No filling defects to suggest DVT on grayscale or color Doppler imaging. Doppler waveforms show normal direction of venous flow, normal respiratory phasicity and response to augmentation. Limited views of the contralateral common femoral vein are unremarkable. OTHER Incidentally noted is presence of a Baker cyst at the RIGHT popliteal fossa, 4.1 x 1.0 x 3.4 cm in size. Limitations: none  IMPRESSION: No femoropopliteal DVT nor evidence of DVT within the visualized calf veins. Baker cyst RIGHT popliteal fossa. If clinical symptoms are inconsistent or if there are persistent or worsening symptoms, further imaging (possibly involving the iliac veins) may be warranted. Electronically Signed   By: Lavonia Dana M.D.   On: 03/14/2020 15:56   DG C-Arm 1-60 Min-No Report  Result Date: 03/29/2020 Fluoroscopy was utilized by the requesting physician.  No radiographic interpretation.    Labs:  CBC: Recent Labs    12/19/19 1307 12/19/19 1307 03/14/20 1316 03/29/20 1058 04/03/20 0952 04/13/20 1014  WBC 7.2  --  6.6 10.0  --  8.1  HGB 11.7*   < > 10.8* 11.0* 11.2 10.6*  HCT 34.5*  --  32.1* 32.2*  --  31.0*  PLT 321  --  300 283  --  283   < > = values in this interval not displayed.    COAGS: Recent Labs    03/29/20 1058 04/13/20 1014  INR 1.0 1.0  APTT 26  --     BMP: Recent Labs    03/14/20 1316 04/03/20 0952 04/06/20 1046 04/13/20 1014  NA 133* 130* 131* 132*  K 4.6 4.0 3.9 3.6  CL 99 95* 94* 97*  CO2 23 25 18* 21*  GLUCOSE 94 98 134* 105*  BUN 27* 22 29* 25*  CALCIUM 9.2 9.0 9.8 8.5*  CREATININE 1.23* 1.12* 1.23* 0.90  GFRNONAA 40* 45* 40* 58*  GFRAA 46* 52* 46* >60    LIVER FUNCTION TESTS: Recent Labs    09/12/19 0823 10/12/19 1323 12/19/19 1307 03/14/20 1316  BILITOT 0.7 0.5 0.6 0.6  AST 26 25 23 19   ALT 26 26 31 16   ALKPHOS 62 64 131* 85  PROT 6.7 6.0* 6.5 6.3*  ALBUMIN 4.3 4.1 3.9 4.0    TUMOR MARKERS: No results for input(s): AFPTM, CEA, CA199, CHROMGRNA in the last 8760 hours.  Assessment and Plan:  86 y.o, female outpatient. History of follicular lymphoma  With right sided hydronephrosis s/p double J stent on 4.15.21. Team is requesting nephrostomy tube placement right sided due to persistent hydronephrosis and AKI in the presence of a stent.  Per note from Bonnye Fava PA dated 4.26.21 Will place referral to IR today and  schedule  her for cysto stent removal with Dr. Erlene Quan following.  Pertinent Imaging 4.24.21 - US renal reads Nephroureteral stent extending from the region of the right renal pelvis to the bladder. Continued moderate to severe right hydronephrosis and cortical thinning  Pertinent IR History 12.10.19 - Biopsy retroperitoneal  12.7.18 - Biopsy retroperitoneal  Pertinent Allergies none  BUN 25 All labs and medications are within acceptable parameters.  Patient is afebrile.  Risks and benefits of right sided PCN placement was discussed with the patient including, but not limited to, infection, bleeding, significant bleeding causing loss or decrease in renal function or damage to adjacent structures.   All of the patient's questions were answered, patient is agreeable to proceed.  Consent signed and in chart.     Thank you for this interesting consult.  I greatly enjoyed meeting [name] and look forward to participating in their care.  A copy of this report was sent to the requesting provider on this date.  Electronically Signed: Avel Peace, NP 04/13/2020, 10:53 AM   I spent a total of  30 Minutes   in face to face in clinical consultation, greater than 50% of which was counseling/coordinating care for nephrostomy tube placement right

## 2020-04-13 NOTE — Progress Notes (Signed)
The c/o pain noted to her whole right side( pain level 10).

## 2020-04-13 NOTE — Procedures (Signed)
Interventional Radiology Procedure Note  Procedure: Image guided right PCN.  Complications: None Recommendations:  - DC 1 hr - to gravity - Do not submerge - Routine care   Signed,  Dulcy Fanny. Earleen Newport, DO

## 2020-04-16 ENCOUNTER — Ambulatory Visit: Payer: Medicare Other

## 2020-04-16 ENCOUNTER — Other Ambulatory Visit: Payer: Self-pay

## 2020-04-16 ENCOUNTER — Other Ambulatory Visit: Payer: Medicare Other

## 2020-04-16 ENCOUNTER — Inpatient Hospital Stay: Payer: Medicare Other | Attending: Hematology and Oncology

## 2020-04-16 ENCOUNTER — Inpatient Hospital Stay: Payer: Medicare Other

## 2020-04-16 VITALS — BP 131/62 | HR 89 | Temp 96.5°F | Resp 18 | Wt 117.0 lb

## 2020-04-16 DIAGNOSIS — Z5112 Encounter for antineoplastic immunotherapy: Secondary | ICD-10-CM | POA: Diagnosis not present

## 2020-04-16 DIAGNOSIS — C8208 Follicular lymphoma grade I, lymph nodes of multiple sites: Secondary | ICD-10-CM | POA: Insufficient documentation

## 2020-04-16 DIAGNOSIS — Z79899 Other long term (current) drug therapy: Secondary | ICD-10-CM | POA: Insufficient documentation

## 2020-04-16 DIAGNOSIS — Z8673 Personal history of transient ischemic attack (TIA), and cerebral infarction without residual deficits: Secondary | ICD-10-CM | POA: Insufficient documentation

## 2020-04-16 DIAGNOSIS — R918 Other nonspecific abnormal finding of lung field: Secondary | ICD-10-CM | POA: Insufficient documentation

## 2020-04-16 DIAGNOSIS — N131 Hydronephrosis with ureteral stricture, not elsewhere classified: Secondary | ICD-10-CM | POA: Insufficient documentation

## 2020-04-16 DIAGNOSIS — Z7952 Long term (current) use of systemic steroids: Secondary | ICD-10-CM | POA: Insufficient documentation

## 2020-04-16 DIAGNOSIS — G893 Neoplasm related pain (acute) (chronic): Secondary | ICD-10-CM | POA: Insufficient documentation

## 2020-04-16 DIAGNOSIS — I10 Essential (primary) hypertension: Secondary | ICD-10-CM | POA: Insufficient documentation

## 2020-04-16 LAB — COMPREHENSIVE METABOLIC PANEL
ALT: 17 U/L (ref 0–44)
AST: 21 U/L (ref 15–41)
Albumin: 3.5 g/dL (ref 3.5–5.0)
Alkaline Phosphatase: 122 U/L (ref 38–126)
Anion gap: 9 (ref 5–15)
BUN: 31 mg/dL — ABNORMAL HIGH (ref 8–23)
CO2: 23 mmol/L (ref 22–32)
Calcium: 8.9 mg/dL (ref 8.9–10.3)
Chloride: 97 mmol/L — ABNORMAL LOW (ref 98–111)
Creatinine, Ser: 1.03 mg/dL — ABNORMAL HIGH (ref 0.44–1.00)
GFR calc Af Amer: 57 mL/min — ABNORMAL LOW (ref >60)
GFR calc non Af Amer: 50 mL/min — ABNORMAL LOW (ref >60)
Glucose, Bld: 173 mg/dL — ABNORMAL HIGH (ref 70–99)
Potassium: 3.6 mmol/L (ref 3.5–5.1)
Sodium: 129 mmol/L — ABNORMAL LOW (ref 135–145)
Total Bilirubin: 0.7 mg/dL (ref 0.3–1.2)
Total Protein: 6.3 g/dL — ABNORMAL LOW (ref 6.5–8.1)

## 2020-04-16 LAB — CBC WITH DIFFERENTIAL/PLATELET
Abs Immature Granulocytes: 0.1 10*3/uL — ABNORMAL HIGH (ref 0.00–0.07)
Basophils Absolute: 0.1 10*3/uL (ref 0.0–0.1)
Basophils Relative: 1 %
Eosinophils Absolute: 0.1 10*3/uL (ref 0.0–0.5)
Eosinophils Relative: 1 %
HCT: 31.6 % — ABNORMAL LOW (ref 36.0–46.0)
Hemoglobin: 10.9 g/dL — ABNORMAL LOW (ref 12.0–15.0)
Immature Granulocytes: 1 %
Lymphocytes Relative: 2 %
Lymphs Abs: 0.2 10*3/uL — ABNORMAL LOW (ref 0.7–4.0)
MCH: 32.2 pg (ref 26.0–34.0)
MCHC: 34.5 g/dL (ref 30.0–36.0)
MCV: 93.2 fL (ref 80.0–100.0)
Monocytes Absolute: 0.8 10*3/uL (ref 0.1–1.0)
Monocytes Relative: 9 %
Neutro Abs: 8.4 10*3/uL — ABNORMAL HIGH (ref 1.7–7.7)
Neutrophils Relative %: 86 %
Platelets: 304 10*3/uL (ref 150–400)
RBC: 3.39 MIL/uL — ABNORMAL LOW (ref 3.87–5.11)
RDW: 12.9 % (ref 11.5–15.5)
WBC: 9.6 10*3/uL (ref 4.0–10.5)
nRBC: 0 % (ref 0.0–0.2)

## 2020-04-16 LAB — TSH: TSH: 3.228 u[IU]/mL (ref 0.350–4.500)

## 2020-04-16 LAB — T4, FREE: Free T4: 1.66 ng/dL — ABNORMAL HIGH (ref 0.61–1.12)

## 2020-04-16 LAB — URIC ACID: Uric Acid, Serum: 3.7 mg/dL (ref 2.5–7.1)

## 2020-04-16 LAB — LACTATE DEHYDROGENASE: LDH: 132 U/L (ref 98–192)

## 2020-04-16 MED ORDER — DIPHENHYDRAMINE HCL 50 MG/ML IJ SOLN
50.0000 mg | Freq: Once | INTRAMUSCULAR | Status: AC
  Administered 2020-04-16: 09:00:00 50 mg via INTRAVENOUS
  Filled 2020-04-16: qty 1

## 2020-04-16 MED ORDER — SODIUM CHLORIDE 0.9 % IV SOLN
Freq: Once | INTRAVENOUS | Status: AC
  Filled 2020-04-16: qty 250

## 2020-04-16 MED ORDER — ACETAMINOPHEN 325 MG PO TABS
650.0000 mg | ORAL_TABLET | Freq: Once | ORAL | Status: AC
  Administered 2020-04-16: 650 mg via ORAL
  Filled 2020-04-16: qty 2

## 2020-04-16 MED ORDER — SODIUM CHLORIDE 0.9 % IV SOLN
20.0000 mg | Freq: Once | INTRAVENOUS | Status: AC
  Administered 2020-04-16: 20 mg via INTRAVENOUS
  Filled 2020-04-16: qty 20

## 2020-04-16 MED ORDER — SODIUM CHLORIDE 0.9 % IV SOLN
1000.0000 mg | Freq: Once | INTRAVENOUS | Status: AC
  Administered 2020-04-16: 1000 mg via INTRAVENOUS
  Filled 2020-04-16: qty 40

## 2020-04-16 MED ORDER — ACYCLOVIR 400 MG PO TABS: 400.0000 mg | ORAL_TABLET | Freq: Every day | ORAL | 5 refills | Status: AC

## 2020-04-16 MED ORDER — ONDANSETRON HCL 8 MG PO TABS: 8.0000 mg | ORAL_TABLET | Freq: Two times a day (BID) | ORAL | 1 refills | Status: AC

## 2020-04-16 NOTE — Telephone Encounter (Signed)
Oral Oncology Patient Advocate Encounter   Mr Wyszynski brought the pharmacy statements to the office this morning.  I have emailed them to Debby at [facility].  Mr Cayce should also receive the 6 capsules of Revlimid tomorrow.  Once he gets the receipt from [employer] and brings it to me, I will send it to Debby and wait for approval from [facility].    I explained to the patients husband to not have her start the medication until we knew for sure when she would receive the balance of the medication from [facility].  He verbalized understanding.  [place] Patient [facility] Phone [phone] Fax [phone] 04/16/2020 3:48 PM

## 2020-04-17 ENCOUNTER — Telehealth: Payer: Self-pay

## 2020-04-17 NOTE — Progress Notes (Signed)
   04/18/20  CC:  Chief Complaint  Patient presents with  . Cysto Stent Removal    HPI: [name] is a 86 y.o. female with PMH stage IIIA follicular non-Hodgkin's lymphoma managed by Dr. Mike Gip who presents today for follow-up of right hydronephrosis.   She underwent right ureteral stent placement for right ureteral obstruction which is noted to be very high-grade with some difficulty placing the stent.  Patient underwent renal US and BMP in the interim with findings of persistent moderate-severe right hydronephrosis and persistently elevated creatinine consistent with stent failure.  Most recent creatinine 1.03 as of 04/16/20.   Underwent right PCNt on 04/13/20.   She reports of blood in her nephrostomy bag and pain which she attributes to stent.   She returns the office today for stent removal.  Vitals:   04/18/20 1339  BP: 118/69  Pulse: 91   NED. A&Ox3.   No respiratory distress   Abd soft, NT, ND Normal external genitalia with patent urethral meatus  Cystoscopy/ Stent removal procedure  Patient identification was confirmed, informed consent was obtained, and patient was prepped using Betadine solution.  Lidocaine jelly was administered per urethral meatus.    Preoperative abx where received prior to procedure.    Procedure: - Flexible cystoscope introduced, without any difficulty.   - Thorough search of the bladder revealed:    normal urethral meatus  Stent seen emanating from right ureteral orifice, grasped with stent graspers, and removed in entirety.    Moderate debris in bladder limiting visualization.  Post-Procedure: - Patient tolerated the procedure well  Assessment/ Plan:  1. Hydronephrosis, right Creatinine stable at 1.03  Stent removed w/o complication  Nephrostomy site regressed  Return in 8 weeks for nephrostomy tube check vs. Clamp trial depending on response to chemo    I, [name], am acting as a scribe for Dr. Hollice Espy,  I have reviewed the above documentation for accuracy and completeness, and I agree with the above.   Hollice Espy, MD

## 2020-04-17 NOTE — Telephone Encounter (Signed)
The patient called and request that i call her back. I have reached out to the patient and didn't get a answer. I have instructed the patient to contact the office at her earlies time today.

## 2020-04-18 ENCOUNTER — Encounter: Payer: Self-pay | Admitting: Urology

## 2020-04-18 ENCOUNTER — Other Ambulatory Visit: Payer: Self-pay

## 2020-04-18 ENCOUNTER — Other Ambulatory Visit: Payer: Self-pay | Admitting: *Deleted

## 2020-04-18 ENCOUNTER — Ambulatory Visit (INDEPENDENT_AMBULATORY_CARE_PROVIDER_SITE_OTHER): Payer: Medicare Other | Admitting: Urology

## 2020-04-18 VITALS — BP 118/69 | HR 91

## 2020-04-18 DIAGNOSIS — N1330 Unspecified hydronephrosis: Secondary | ICD-10-CM

## 2020-04-18 DIAGNOSIS — G893 Neoplasm related pain (acute) (chronic): Secondary | ICD-10-CM

## 2020-04-18 MED ORDER — CIPROFLOXACIN HCL 500 MG PO TABS
500.0000 mg | ORAL_TABLET | Freq: Once | ORAL | Status: AC
  Administered 2020-04-18: 500 mg via ORAL

## 2020-04-18 NOTE — Telephone Encounter (Signed)
Oral Oncology Patient Advocate Encounter  Received notification from [employer] Patient Assistance program that patient has been successfully enrolled into their program to receive Revlimid from the manufacturer at $0 out of pocket until 12/14/2020.    I called and spoke with the patients husband.  He knows we will have to re-apply.   Specialty Pharmacy that will dispense medication is [facility] by [employer].  Patient knows to call the office with questions or concerns.   Oral Oncology Clinic will continue to follow.  [place] [facility] [place] Phone [phone] Fax [phone] 04/18/2020 4:27 PM

## 2020-04-18 NOTE — Telephone Encounter (Signed)
Emailed Debby, from [employer], receipt label from [employer] for Revlimid.

## 2020-04-19 ENCOUNTER — Other Ambulatory Visit: Payer: Self-pay | Admitting: Pharmacist

## 2020-04-19 ENCOUNTER — Telehealth: Payer: Self-pay

## 2020-04-19 DIAGNOSIS — C8203 Follicular lymphoma grade I, intra-abdominal lymph nodes: Secondary | ICD-10-CM

## 2020-04-19 LAB — URINALYSIS, COMPLETE
Bilirubin, UA: NEGATIVE
Glucose, UA: NEGATIVE
Nitrite, UA: NEGATIVE
Specific Gravity, UA: 1.025 (ref 1.005–1.030)
Urobilinogen, Ur: 0.2 mg/dL (ref 0.2–1.0)
pH, UA: 7.5 (ref 5.0–7.5)

## 2020-04-19 LAB — MICROSCOPIC EXAMINATION: RBC, Urine: >30 /hpf — AB (ref 0–2)

## 2020-04-19 MED ORDER — LENALIDOMIDE 5 MG PO CAPS: 5.0000 mg | ORAL_CAPSULE | Freq: Every day | ORAL | 0 refills | Status: DC

## 2020-04-19 MED ORDER — HYDROCODONE-ACETAMINOPHEN 5-325 MG PO TABS: ORAL_TABLET | ORAL | 0 refills | Status: DC

## 2020-04-19 NOTE — Progress Notes (Signed)
[facility]  [address] [place], [place] [address] Phone: [phone]  Fax: [phone]   Clinic Day:  04/20/2020  Referring physician: Marinda Elk, MD  Chief Complaint: [name] is a 86 y.o. female with a history of stage IIIA follicular non-Hodgkin's lymphoma and right sided hydronephrosis s/p external stent who is seen for assessment on day 5 of cycle #1 Revlimid and obinutuzumab.    HPI: The patient was last seen in the medical oncology clinic on 04/13/2020.  At that time, she had discomfort associated with the new external right nephrostomy tube placement.  Exam revealed chronic right lower extremity edema.  We discussed plans to initiate obinutuzumab and Revlimid on 04/16/2020.  Because of the potential risk for reaction, decision was made to receive her initial obinutuzumab infusions in [place].  Labs on 04/16/2020 revealed a hematocrit 31.6, hemoglobin 10.9, MCV 93.2, platelets 304,000, WBC 9,600, ANC 8,400. LDH was 132.  Uric acid was 3.7.  TSH was 3.228 and free T4 was 1.66.  She received day 1 of cycle #1 obinutuzumab well.  Patient underwent cystoscopy and internal stent removal on 04/18/2020 by Dr. Hollice Espy. She notes that this was uncomfortable, but it went well.   During the interim, she has been "ok". She and her husband bring up some concerns about how often to change the dressing for her nephrostomy tubes, and what to do regarding care of the dressing.  She will be seen in the urology office today.  She received her Revlimid yesterday, and began taking it as directed  She continues taking hydrocodone-acetaminophen 5-325 mg for pain. She notes that she takes between 4-5 per day. Pain is inadequately controlled.  She hurts on the right side of her abdomen. She denies issues with constipation. Her husband notes that she is sitting in a chair most of the day, and that she is very fatigued. She is afraid of  falling. She has a cane and a walker.    Past Medical History:  Diagnosis Date   Arthritis    osteoporosis also. 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 loss of conciousness while sitting at table. could not diagnose   Syncope     Past Surgical History:  Procedure Laterality Date   APPENDECTOMY     BONE MARROW BIOPSY  08/09/2002   BREAST LUMPECTOMY  1950s   benign   CATARACT EXTRACTION  03/2009   COLONOSCOPY     CYSTOSCOPY WITH STENT PLACEMENT Right 03/29/2020   Procedure: CYSTOSCOPY WITH STENT PLACEMENT;  Surgeon: Hollice Espy, MD;  Location: [facility] ORS;  Service: Urology;  Laterality: Right;   ESOPHAGOGASTRODUODENOSCOPY (EGD) WITH PROPOFOL N/A 09/23/2019   Procedure: ESOPHAGOGASTRODUODENOSCOPY (EGD) WITH PROPOFOL;  Surgeon: Jonathon Bellows, MD;  Location: [facility]COPY;  Service: Gastroenterology;  Laterality: N/A;   FRACTURE SURGERY Left    wrist   IR NEPHROSTOMY PLACEMENT RIGHT  04/13/2020   LYMPH NODE BIOPSY  08/07/2002   cervical   TUBAL LIGATION      Family History  Problem Relation Age of Onset   Cancer Father        Throat   Cancer Sister 77       Colon    Social History:   reports that she has never smoked. She has never used smokeless tobacco. She reports that she does not drink alcohol or use drugs. She lives in [place].She and her husband are getting restless staying at home during the pandemic. The patient is accompanied by her husband today.    Allergies:  Allergies  Allergen Reactions   Lisinopril Cough   Primidone Other (See Comments)    Makes her feel "loopy and unsteady" Also, did not help the tremors    Current Medications: Current Outpatient Medications  Medication Sig Dispense Refill   Acetaminophen (TYLENOL ARTHRITIS EXT RELIEF PO) Take 1 tablet by mouth daily as needed (pain).     acyclovir (ZOVIRAX) 400 MG tablet Take 1 tablet (400 mg total) by mouth daily. 30 tablet 5   AZELASTINE & FLUTICASONE NA 1 spray each nase twice daily.     calcium-vitamin D (CALCIUM 500/D) 500-200 MG-UNIT tablet Take 1 tablet by mouth 2 (two) times daily.      denosumab (PROLIA) 60 MG/ML SOSY injection Inject 60 mg into the skin every 6 (six) months.      DEXILANT 60 MG capsule TAKE ONE CAPSULE BY MOUTH ONE TIME DAILY  90 capsule 1   Flax Oil-Fish Oil-Borage Oil (FISH OIL-FLAX OIL-BORAGE OIL) CAPS Take 1,000-1,400 mg by mouth daily.     Glucosamine HCl-MSM (GLUCOSAMINE-MSM PO) Take 1 tablet by mouth daily.      HYDROcodone-acetaminophen (NORCO) 5-325 MG tablet 1/2 to 1 tablet every 4 hours as needed for pain. 40 tablet 0   lenalidomide (REVLIMID) 5 MG capsule Take 1 capsule (5 mg total) by mouth daily. Take for 21 days, then hold for 7 days. Repeat every 28 days. 15 capsule 0   Magnesium Citrate 125 MG CAPS Take 1 tablet by mouth daily.      melatonin 5 MG TABS Take 5 mg by mouth at bedtime as needed.      Multiple Vitamin (MULTIVITAMIN) tablet Take 1 tablet by mouth daily.     Multiple Vitamins-Minerals (PRESERVISION AREDS 2 PO) Take 1 tablet by mouth daily.      naproxen sodium (ALEVE) 220 MG tablet Take 220 mg by mouth daily as needed.        polyethylene glycol (MIRALAX / GLYCOLAX) 17 g packet Take 17 g by mouth daily.     predniSONE (DELTASONE) 10 MG tablet Take 3 tablets (30 mg total) by mouth daily with breakfast. 30 tablet 0   Probiotic Product (PROBIOTIC DAILY PO) Take 2 tablets by mouth daily.      triamcinolone cream (KENALOG) 0.1 % Apply 1 application topically as needed.      Turmeric Curcumin 500 MG CAPS Take 1 capsule by mouth daily.      ondansetron (ZOFRAN) 8 MG tablet Take 1 tablet (8 mg total) by mouth  2 (two) times daily. Start second day after chemotherapy. Then take as needed for nausea or vomiting. (Patient not taking: Reported on 04/20/2020) 30 tablet 1   oxybutynin (DITROPAN) 5 MG tablet Take 1 tablet (5 mg total) by mouth every 8 (eight) hours as needed for bladder spasms. (Patient not taking: Reported on 04/18/2020) 30 tablet 0   tamsulosin (FLOMAX) 0.4 MG CAPS capsule Take 1 capsule (0.4 mg total) by mouth daily. (Patient not taking: Reported on 04/18/2020) 30 capsule 0   No current facility-administered medications for this visit.    Review of Systems  Constitutional: Positive for malaise/fatigue. Negative for chills, diaphoresis, fever and weight loss (up 2 lbs).  HENT: Negative for congestion, ear discharge, hearing loss, nosebleeds, sinus pain, sore throat and tinnitus.   Eyes: Negative.  Negative for blurred vision and double vision.  Respiratory: Positive for shortness of breath (chronic since diagnosis of recurrent lymphoma). Negative for cough, hemoptysis, sputum production and stridor.   Cardiovascular: Positive for orthopnea and leg swelling (right leg and foot; wrapping  foot with gauze). Negative for chest pain and palpitations.  Gastrointestinal: Positive for abdominal pain (with movement). Negative for blood in stool, constipation (takes MiraLax), diarrhea, heartburn, melena, nausea and vomiting.       Dysphagia associated with non-compressive upper airway adenonopathy.  Thick saliva.   Genitourinary: Positive for flank pain (currently 6/10) and hematuria (improving). Negative for dysuria, frequency and urgency.       S/p right external nephrostomy tube placement.  Musculoskeletal: Positive for joint pain (knee) and myalgias (right lower extremity). Negative for back pain, falls and neck pain.  Skin: Negative for itching and rash.  Neurological: Positive for tremors (essential) and weakness. Negative for dizziness, sensory change, speech change, focal weakness and headaches.  Endo/Heme/Allergies: Negative.  Does not bruise/bleed easily.  Psychiatric/Behavioral: Negative for depression and memory loss. The patient has insomnia (related to abdominal discomfort). The patient is not nervous/anxious.   All other systems reviewed and are negative.  Performance status (ECOG): 2   Vitals Blood pressure 121/64, pulse 64, temperature 97.9 F (36.6 C), temperature source Tympanic, resp. rate 18, height '5\' 6"'$  (1.676 m), weight 119 lb 3.2 oz (54.1 kg), SpO2 98 %.   Physical Exam  Constitutional: She is oriented to person, place, and time. No distress. Face mask in place.  Thin slightly frail appearing woman sitting in clinic in no acute distress. She was examined in her wheelchair.  HENT:  Head: Normocephalic and atraumatic.  Mouth/Throat: Mucous membranes are normal. No oral lesions.  Styled gray hair.  Eyes: Pupils are equal, round, and reactive to light. Conjunctivae and EOM are normal. Right eye exhibits no discharge. Left eye exhibits no discharge. No scleral icterus.  Gold rimmed glasses. Blue eyes.   Neck: No JVD present.  Cardiovascular: Normal rate, regular rhythm and normal heart sounds. Exam reveals no gallop and no friction rub.  No murmur heard. Pulmonary/Chest: Effort normal and breath sounds normal. No respiratory distress. She has no wheezes. She has no rhonchi. She has no rales.  Abdominal: Soft. Normal appearance and bowel sounds are normal. She exhibits no  distension and no mass. There is no hepatosplenomegaly. There is abdominal tenderness in the right lower quadrant. There is no rebound, no guarding and no CVA tenderness.  Genitourinary:    Genitourinary Comments: Right external nephrostomy tube with C/D/I dressing.   Musculoskeletal:        General: Edema (chronic right lower extremity, slightly improved in thigh) present. Normal  range of motion.     Cervical back: Normal range of motion and neck supple.     Comments: Right leg tenderness and left lower leg tenderness.  Lymphadenopathy:       Head (right side): No preauricular, no posterior auricular and no occipital adenopathy present.       Head (left side): No preauricular, no posterior auricular and no occipital adenopathy present.    She has no cervical adenopathy.    She has no axillary adenopathy.       Right: No inguinal and no supraclavicular adenopathy present.       Left: No inguinal and no supraclavicular adenopathy present.  Neurological: She is alert and oriented to person, place, and time.  Skin: Skin is warm, dry and intact. No bruising, no lesion and no rash noted. She is not diaphoretic. No erythema. No pallor.  Psychiatric: She has a normal mood and affect. Her behavior is normal. Judgment and thought content normal.  Nursing note and vitals reviewed.   Imaging studies: 11/08/2018:  PET scanrevealed a 5.5 x 7.3 cm hypermetabolic (SUV 21.2) right retroperitoneal nodal mass and aortocaval lymph node.  03/11/2019:  Abdomen and pelvis CTrevealed partial treatment response. Right retroperitoneal nodal mass and aortocaval adenopathy haddecreased in size (7.1 x 5.8 cm to 6.5 x 4.1 cm).There was decreased mass effect on the IVC. There were no new sites of disease. 09/12/2019:  Abdomen and pelvis CTrevealed a significant interval decrease in size of a retroperitoneal massthat measured 4.6 x 2.8 cm (previously 6.5 x 4.1 cm). There was redemonstrated, adjacent post treatment  retroperitoneal soft tissue about the inferior vena cava and aorta. Findings were consistent with ongoing treatment response. There was no discretely enlarged lymph nodes in the abdomen or pelvis. 03/21/2020:  Abdomen and pelvis CT revealed moderate right hydroureteronephrosis which appearedto extend to the L5 level, but withoutdefinite obstructing calculus. The cause ofthedistal ureteral obstructionwasunknown. There was sigmoid diverticulosis without inflammationand several small calcified uterine fibroids.Appears to be interval development of retroperitoneal adenopathy. Left periaortic lymph node measures 2 cm. There is a 2.6 cm right paratracheal node. There is probable bilateral iliac adenopathy. Adenopathy may be the cause of distal right ureteral obstruction. 04/05/2020:  PET scan revealed hypermetabolic prevascular lymph nodes (nodes up to 13 mm; SUV 4.2) new from comparison PET CT 11/08/2018 c/w new lymphoma recurrence ([place] 5). There was new hypermetabolic periaortic lymphadenopathy ([place] 5); most intense node was 2.2 cm between IVC and aorta at the level of the kidneys (SUV 5.7).  There was a similar 1.6 cm left aorta node (SUV 3.7).  Spleen and marrow were normal. There was hydronephrosis of the RIGHT renal collecting system with stent in place.  10/27/2017:  Bone densityrevealed osteoporosiswith a T-score of -3.8 in the left forearm radius.  11/07/2019:  Bonedensityrevealed osteoporosis with a T score of -2.5 in the right femoral neck. 03/14/2020:  Right lower extremity duplex revealed no evidence of DVT. 04/10/2020:  Right lower extremity duplex revealed no DVT.  There was a 3.4 x 0.8 x 2.9 cm anechoic fluid collection with the right popliteal fossa compatible with a Baker's cyst.  04/10/2020:  Perfusion lung scan revealed very low probability for pulmonary embolism.  04/10/2020:  CXR revealed Ill-defined opacity in the right mid to upper lung which may reflect  early bronchopneumonia.  04/12/2020:  CXR revealed chronic underlying lung changes. There was persistent patchy right upper lobe airspace opacity felt secondary to a small infiltrate or area of atelectasis.  Procedure visit on 04/18/2020  Component Date Value Ref Range Status   Specific Gravity, UA 04/18/2020 1.025  1.005 - 1.030 Final   pH, UA 04/18/2020 7.5  5.0 - 7.5 Final   Color, UA 04/18/2020 Yellow  Yellow Final   Appearance Ur 04/18/2020 Hazy* Clear Final   Leukocytes,UA 04/18/2020 1+* Negative Final   Protein,UA 04/18/2020 2+* Negative/Trace Final   Glucose, UA 04/18/2020 Negative  Negative Final   Ketones, UA 04/18/2020 1+* Negative Final   RBC, UA 04/18/2020 3+* Negative Final   Bilirubin, UA 04/18/2020 Negative  Negative Final   Urobilinogen, Ur 04/18/2020 0.2  0.2 - 1.0 mg/dL Final   Nitrite, UA 04/18/2020 Negative  Negative Final   Microscopic Examination 04/18/2020 See below:   Final   WBC, UA 04/18/2020 11-30* 0 - 5 /hpf Final   RBC 04/18/2020 >30* 0 - 2 /hpf Final   Epithelial Cells (non renal) 04/18/2020 0-10  0 - 10 /hpf Final   Casts 04/18/2020 Present* None seen /lpf Final   Cast Type 04/18/2020 Hyaline casts  N/A Final   Crystals 04/18/2020 Present* N/A Final   Crystal Type 04/18/2020 Calcium Oxalate  N/A Final   Bacteria, UA 04/18/2020 Moderate* None seen/Few Final    Assessment:  AZHA CONSTANTIN is a 86 y.o. female with a history of stage IIIA follicular non-Hodgkin's lymphoma. She presented in the summer of 2003 with an epigastric and left upper quadrant mass and left arm weakness with decreased mobility. Abdominal ultrasound revealed a large mass in the head of the pancreas with multiple enlarged lymph nodes and a large lobulated mass in the right retroperitoneal area. MRI of the left brachial plexus revealed a 2.5 cm soft tissue mass and abnormal lymph node along the left brachial plexus and in the supraclavicular  region.  Cervical node biopsyon 08/06/2002 revealed a grade I follicular non-Hodgkin's lymphoma. Bone marrowon 08/09/2002 was negative. Lumbar puncture was negative. She receivedLeukeran and Rituxan. As her arm pain and mobility was not relieved, she received local radiationto the left brachial plexus area. Leukeran continued through 02/28/2013 (discontinued secondary side effects).  She developed progressive disease in 10/2003. Patient received CVPchemotherapy beginning 01/02/2004. In 06/2004 she was switched to CNOP. She received 8 cycles completing on 01/15/2005. She began maintenance Rituxanon 04/23/2004 (4 weekly doses every 3 months for 2 years). Therapy completed on 09/13/2008.   Excision of a 1 cm right preauricular masson 10/04/2017 revealed a low grade follicular lymphoma. CT guided biopsyof the retroperitoneal nodal masson 11/20/2017 revealed grade I follicular lymphoma. Immunohistochemistry revealed positive for CD10, BCL-2, BCL-6, Ki67 (15-20%) and negative for cyclin D1 and CD5. Flow cytometry revealed a CD10+ clonal B-cell population.   She received 4 weekly cycles of Rituxan(12/24/2017 - 01/14/2018) and 3 cycles of maintenance Rituxan(03/24/2018 - 10/08/2018).  PET scanon 11/08/2018 revealed a 5.5 x 7.3 cm hypermetabolic (SUV 88.9) right retroperitoneal nodal mass and aortocaval lymph node. CT guided biopsyon 16/94/5038 confirmed follicular lymphoma, grade I.  She received radiation(IMRT) of 3000 cGy to the retroperitoneal mass from 01/11/2019 - 01/31/2019.  The following studies were negativeon 10/23/2017 and 04/03/2020: hepatitis B core antibody, hepatitis B surface antigen, hepatitis C antibody. G6PD assay was normal on 04/03/2020.  She presented with a 1 month history of abdominal discomfort. Creatinine has increased. She had new right sided hydroureteronephrosis.  Abdomen and pelvis CTon 03/21/2020 revealed moderate right  hydroureteronephrosis which appearedto extend to the L5 level, but withoutdefinite obstructing calculus. The cause ofthedistal ureteral obstructionwasunknown. There was sigmoid diverticulosis without  inflammationand several small calcified uterine fibroids.Appears to be interval development of retroperitoneal adenopathy. Left periaortic lymph node measures 2 cm. There is a 2.6 cm right paratracheal node. There is probable bilateral iliac adenopathy. Adenopathy may be the cause of distal right ureteral obstruction.  PET scan on 04/05/2020 revealed hypermetabolic prevascular lymph nodes (nodes up to 13 mm; SUV 4.2) new from comparison PET CT 11/08/2018 c/w new lymphoma recurrence ([place] 5). There was new hypermetabolic periaortic lymphadenopathy ([place] 5); most intense node was 2.2 cm between IVC and aorta at the level of the kidneys (SUV 5.7).  There was a similar 1.6 cm left aorta node (SUV 3.7).  Spleen and marrow were normal. There was hydronephrosis of the RIGHT renal collecting system with stent in place  She is day 5 of cycle #1 obinutuzumab ([place]) and Revlimid.  Revlimid began on 04/19/2020.  She underwent right ureteral stent placement on 03/29/2020 followed by external nephrostomy tube placement on 04/13/2020.  Internal nephrostomy tube was removed on 04/18/2020.  She hasright lower extremity edema. Right lower extremity duplex on 03/14/2020 and 04/10/2020 revealed no evidence of DVT.  Perfusion scan on 04/10/2020 revealed low probability of pulmonary embolism.  She was admitted to [facility] 08/15/2015 - 08/25/2015 with bloody diarrhea then hemolytic uremic syndromerelated to E. coli 0157. She had a complicated course with acute renal failure and anemia. She was then in rehab for 3 weeks.   Bonedensityon 11/07/2019 revealed osteoporosis with a T score of -2.5 in the right femoral neck.She began every 6 month Proliaon 04/20/2018  (last01/03/2020).  Symptomatically, she is doing fair.  Pain is modestly controlled.  She has chronic right lower extremity edema.  Plan: 1.   Review labs from 04/16/2020. 2.   Stage IIIAfollicular non-Hodgkin's lymphoma Patient has been treated with several regimens in the past:                   CVP, CNOP, and Rituxan.                   Disease grew on maintenance Rituxan.       Patient is s/p IMRT to the retroperitoneal mass. PET scan on 04/05/2020 confirmed recurrent lymphoma in multiple sites.   No evidence of Richter's phenomenon in imaging (SUVs low).  Current plan: Revlimid and obinutuzimab [name]).       Revlimid 5 mg po q day on days 1-21 of cycles 1-6.   Obinutuzumab 1000 mg IV on days 1, 8, 15 of cycle #1 then 1000 mg on day 1 of cycles #2-6.  Anticipate maintenance treatment for cycles 7-18.  Patient is currently day 5 of cycle #1 Revlmid and obinutauzumab.   She tolerated her first infusion of obinutuzumab well.   Continue current obinutuzumab infusions in [place] for month #1   Review plan for month #2 obintuzumab in [place].  Discuss prophylactic medications and need for dose adjustment with current renal insufficiency.   Begin allopurinol 100 mg a day.   Continue acyclovir 400 mg a day (possible increase to BID next week).   Anticipate initiation of Septra SS next week.   Anticipate initiation of baby aspirin or possibly low dose Eliquis next week based on renal function and clearance by urology.  Discuss symptom management.  She has antiemetics and pain medications at home to use on a prn bases.  Interventions are adequate.    3. Right hydroureteronephrosis Adenopathy caused obstruction. Patient is s/p internal then external nephrostomy tube placement.   Review that  external stent will be temporary until adenopathy has resolved.  Follow-up with urology office today after clinic for care instructions. 4. Right lower  extremity edema Etiology secondary to obstructive adenopathy in abdomen/pelvis limiting venous return. Duplex on 03/14/2020 and 04/10/2020 revealed no evidence of DVT. VQ scan on 03/21/2020 revealed no evidence of pulmonary embolism.  Anticipate initiation of baby aspirin or low dose Eliquis secondary to slow flow and risk of thrombosis.  Await follow-up improvement in renal function and hemostasis s/p nephrostomy tube placement prior to consideration of Eliquis.  5.   Cancer-related pain  Patient has been taking hydrocodone/Tylenol 5-325 4-5 times /day (max 6) for a total daily dose of Tyelonol up to 1950 mg.  Discuss limiting amount of Tylenol.  Discuss trial of oxycodone 5 mg (1/2 - 1 tablet) every 6 hours prn pain instead of hydrocodone with Tylenol.  Patient to keep a pain diary for initiation of long acting pain medications. 6.   RTC in [place] on 04/23/2020 for labs (CBC with diff, CMP, LDH, uric acid) and day 8 of cycle #1 obinutuzumab. 7.   Consult Billey Chang, NP on 04/23/2020 for supportive care and pain mangement. 8.   RTC in 1 week in [place] for MD assessment and further adjustment of medications.  I discussed the assessment and treatment plan with the patient.  The patient was provided an opportunity to ask questions and all were answered.  The patient agreed with the plan and demonstrated an understanding of the instructions.  The patient was advised to call back if the symptoms worsen or if the condition fails to improve as anticipated.  I provided 34 minutes (9:52 AM - 10:26 AM) of face-to-face time during this encounter and > 50% was spent counseling as documented under my assessment and plan.     Lequita Asal, MD, PhD    04/20/2020, 10:26 AM  I, Jacqualyn Posey, am acting as a Education administrator for [employer]. Mike Gip, MD.   I, Aaminah Forrester C. Mike Gip, MD, have reviewed the above documentation for accuracy and completeness, and I agree with the  above.

## 2020-04-19 NOTE — Telephone Encounter (Signed)
Spoke with the patient to inform her that her Norco has been sent to the pharmacy. The patient was understanding and agreeable.

## 2020-04-20 ENCOUNTER — Other Ambulatory Visit: Payer: Self-pay

## 2020-04-20 ENCOUNTER — Encounter: Payer: Self-pay | Admitting: Hematology and Oncology

## 2020-04-20 ENCOUNTER — Telehealth: Payer: Self-pay

## 2020-04-20 ENCOUNTER — Inpatient Hospital Stay (HOSPITAL_BASED_OUTPATIENT_CLINIC_OR_DEPARTMENT_OTHER): Payer: Medicare Other | Admitting: Hematology and Oncology

## 2020-04-20 ENCOUNTER — Ambulatory Visit (INDEPENDENT_AMBULATORY_CARE_PROVIDER_SITE_OTHER): Payer: Medicare Other

## 2020-04-20 VITALS — BP 121/64 | HR 64 | Temp 97.9°F | Resp 18 | Ht 66.0 in | Wt 119.2 lb

## 2020-04-20 DIAGNOSIS — N1330 Unspecified hydronephrosis: Secondary | ICD-10-CM

## 2020-04-20 DIAGNOSIS — Z5112 Encounter for antineoplastic immunotherapy: Secondary | ICD-10-CM | POA: Diagnosis not present

## 2020-04-20 DIAGNOSIS — G893 Neoplasm related pain (acute) (chronic): Secondary | ICD-10-CM

## 2020-04-20 DIAGNOSIS — C8208 Follicular lymphoma grade I, lymph nodes of multiple sites: Secondary | ICD-10-CM | POA: Diagnosis not present

## 2020-04-20 DIAGNOSIS — R600 Localized edema: Secondary | ICD-10-CM

## 2020-04-20 MED ORDER — ALLOPURINOL 100 MG PO TABS: 100.0000 mg | ORAL_TABLET | Freq: Every day | ORAL | 1 refills | Status: AC

## 2020-04-20 MED ORDER — OXYCODONE HCL 5 MG PO TABS: ORAL_TABLET | ORAL | 0 refills | Status: DC

## 2020-04-20 NOTE — Telephone Encounter (Signed)
Nurse from cancer center called stating patient needs help changing her dressing on her neph tubing. Nurse at cancer center did not feel comfortable changing it. Left message for patient to call back regarding what time she would come in so we could show her.

## 2020-04-20 NOTE — Patient Instructions (Addendum)
  Oxycodone 1/2 or 1 pill to be taken instead of current pain pill (hydrocodone with Tylenol) for severe pain.  Patient to keep a pain diary.

## 2020-04-20 NOTE — Progress Notes (Signed)
Patient was present today for instruction on dressing changes for nephrostomy tube. Patient's husband was given detailed instruction with demonstration of dressing change, 4x4 and tegaderm. Showering instructions were given and patient and husband were instructed to change dressing as soiled PRN. Supplies were given today for them to utilize at home.

## 2020-04-20 NOTE — Progress Notes (Signed)
The patient c/o abdomen pain ( today 8)

## 2020-04-20 NOTE — Telephone Encounter (Signed)
Spoke with Dr Erlene Quan ( Urology) office to inform them that the patient and her husband need instruction on how to change the nephrostomy bag. They informed me to have the patient come to there office in [place] today. I have informed the patient and his wife and they are on to Dr Lucienne Capers office now. The patient and her husband was agreeable and understanding.

## 2020-04-22 DIAGNOSIS — G893 Neoplasm related pain (acute) (chronic): Secondary | ICD-10-CM | POA: Insufficient documentation

## 2020-04-23 ENCOUNTER — Telehealth: Payer: Self-pay

## 2020-04-23 ENCOUNTER — Inpatient Hospital Stay (HOSPITAL_BASED_OUTPATIENT_CLINIC_OR_DEPARTMENT_OTHER): Payer: Medicare Other | Admitting: Hospice and Palliative Medicine

## 2020-04-23 ENCOUNTER — Other Ambulatory Visit: Payer: Self-pay | Admitting: Hematology and Oncology

## 2020-04-23 ENCOUNTER — Inpatient Hospital Stay: Payer: Medicare Other

## 2020-04-23 ENCOUNTER — Other Ambulatory Visit: Payer: Self-pay

## 2020-04-23 VITALS — BP 137/73 | HR 85 | Temp 97.6°F | Resp 18 | Wt 118.5 lb

## 2020-04-23 DIAGNOSIS — G893 Neoplasm related pain (acute) (chronic): Secondary | ICD-10-CM

## 2020-04-23 DIAGNOSIS — C8208 Follicular lymphoma grade I, lymph nodes of multiple sites: Secondary | ICD-10-CM | POA: Diagnosis not present

## 2020-04-23 DIAGNOSIS — E871 Hypo-osmolality and hyponatremia: Secondary | ICD-10-CM

## 2020-04-23 DIAGNOSIS — Z5112 Encounter for antineoplastic immunotherapy: Secondary | ICD-10-CM | POA: Diagnosis not present

## 2020-04-23 DIAGNOSIS — Z515 Encounter for palliative care: Secondary | ICD-10-CM

## 2020-04-23 DIAGNOSIS — R319 Hematuria, unspecified: Secondary | ICD-10-CM

## 2020-04-23 LAB — COMPREHENSIVE METABOLIC PANEL
ALT: 21 U/L (ref 0–44)
AST: 23 U/L (ref 15–41)
Albumin: 3.3 g/dL — ABNORMAL LOW (ref 3.5–5.0)
Alkaline Phosphatase: 135 U/L — ABNORMAL HIGH (ref 38–126)
Anion gap: 10 (ref 5–15)
BUN: 24 mg/dL — ABNORMAL HIGH (ref 8–23)
CO2: 25 mmol/L (ref 22–32)
Calcium: 8.3 mg/dL — ABNORMAL LOW (ref 8.9–10.3)
Chloride: 91 mmol/L — ABNORMAL LOW (ref 98–111)
Creatinine, Ser: 1.02 mg/dL — ABNORMAL HIGH (ref 0.44–1.00)
GFR calc Af Amer: 58 mL/min — ABNORMAL LOW (ref >60)
GFR calc non Af Amer: 50 mL/min — ABNORMAL LOW (ref >60)
Glucose, Bld: 162 mg/dL — ABNORMAL HIGH (ref 70–99)
Potassium: 3.8 mmol/L (ref 3.5–5.1)
Sodium: 126 mmol/L — ABNORMAL LOW (ref 135–145)
Total Bilirubin: 0.7 mg/dL (ref 0.3–1.2)
Total Protein: 5.8 g/dL — ABNORMAL LOW (ref 6.5–8.1)

## 2020-04-23 LAB — CBC WITH DIFFERENTIAL/PLATELET
Abs Immature Granulocytes: 0.11 10*3/uL — ABNORMAL HIGH (ref 0.00–0.07)
Basophils Absolute: 0 10*3/uL (ref 0.0–0.1)
Basophils Relative: 0 %
Eosinophils Absolute: 0.1 10*3/uL (ref 0.0–0.5)
Eosinophils Relative: 2 %
HCT: 30.3 % — ABNORMAL LOW (ref 36.0–46.0)
Hemoglobin: 10.4 g/dL — ABNORMAL LOW (ref 12.0–15.0)
Immature Granulocytes: 2 %
Lymphocytes Relative: 1 %
Lymphs Abs: 0.1 10*3/uL — ABNORMAL LOW (ref 0.7–4.0)
MCH: 31.8 pg (ref 26.0–34.0)
MCHC: 34.3 g/dL (ref 30.0–36.0)
MCV: 92.7 fL (ref 80.0–100.0)
Monocytes Absolute: 0.7 10*3/uL (ref 0.1–1.0)
Monocytes Relative: 9 %
Neutro Abs: 6.4 10*3/uL (ref 1.7–7.7)
Neutrophils Relative %: 86 %
Platelets: 210 10*3/uL (ref 150–400)
RBC: 3.27 MIL/uL — ABNORMAL LOW (ref 3.87–5.11)
RDW: 12.8 % (ref 11.5–15.5)
WBC: 7.4 10*3/uL (ref 4.0–10.5)
nRBC: 0 % (ref 0.0–0.2)

## 2020-04-23 LAB — URINALYSIS, COMPLETE (UACMP) WITH MICROSCOPIC
Bilirubin Urine: NEGATIVE
Glucose, UA: NEGATIVE mg/dL
Hgb urine dipstick: NEGATIVE
Ketones, ur: 5 mg/dL — AB
Nitrite: NEGATIVE
Protein, ur: NEGATIVE mg/dL
Specific Gravity, Urine: 1.006 (ref 1.005–1.030)
Squamous Epithelial / HPF: NONE SEEN (ref 0–5)
pH: 6 (ref 5.0–8.0)

## 2020-04-23 MED ORDER — SODIUM CHLORIDE 0.9 % IV SOLN
1000.0000 mg | Freq: Once | INTRAVENOUS | Status: AC
  Administered 2020-04-23: 1000 mg via INTRAVENOUS
  Filled 2020-04-23: qty 40

## 2020-04-23 MED ORDER — MORPHINE SULFATE ER 15 MG PO TBCR: 15.0000 mg | EXTENDED_RELEASE_TABLET | Freq: Two times a day (BID) | ORAL | 0 refills | Status: AC

## 2020-04-23 MED ORDER — SODIUM CHLORIDE 0.9 % IV SOLN
10.0000 mg | Freq: Once | INTRAVENOUS | Status: AC
  Administered 2020-04-23: 10 mg via INTRAVENOUS
  Filled 2020-04-23: qty 10

## 2020-04-23 MED ORDER — DIPHENHYDRAMINE HCL 50 MG/ML IJ SOLN
50.0000 mg | Freq: Once | INTRAMUSCULAR | Status: AC
  Administered 2020-04-23: 50 mg via INTRAVENOUS
  Filled 2020-04-23: qty 1

## 2020-04-23 MED ORDER — SODIUM CHLORIDE 0.9 % IV SOLN
Freq: Once | INTRAVENOUS | Status: AC
  Filled 2020-04-23: qty 250

## 2020-04-23 MED ORDER — ACETAMINOPHEN 325 MG PO TABS
650.0000 mg | ORAL_TABLET | Freq: Once | ORAL | Status: AC
  Administered 2020-04-23: 650 mg via ORAL
  Filled 2020-04-23: qty 2

## 2020-04-23 NOTE — Progress Notes (Signed)

## 2020-04-23 NOTE — Progress Notes (Signed)
[facility]  Telephone:([phone] [phone]   Name: [name] Date: 04/23/2020 MRN: [id number]  DOB: 03/12/1932  Patient Care Team: Marinda Elk, MD as PCP - General (Physician Assistant) Lequita Asal, MD as Referring Physician (Hematology and Oncology) Noreene Filbert, MD as Referring Physician (Radiation Oncology)    REASON FOR CONSULTATION: [name] is a 86 y.o. female with multiple medical problems including stage IIIa follicular non-Hodgkin's lymphoma with obstructive adenopathy causing right-sided hydronephrosis status post nephrostomy tube placement, who is on current treatment with Revlimid and obinutuzumab.  Patient has had right-sided flank pain secondary to her obstructive adenopathy.  She has also had fatigue.  She was referred to palliative care to help address goals and manage ongoing symptoms.  SOCIAL HISTORY:     reports that she has never smoked. She has never used smokeless tobacco. She reports that she does not drink alcohol or use drugs.   Patient is married and lives at home with her husband.  She has a son in [place] and a daughter in [place], [place].  Patient retired as an Automotive engineer.  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  ADVANCE DIRECTIVES:  On file  CODE STATUS:   PAST MEDICAL HISTORY: Past Medical History:  Diagnosis Date  . Arthritis    osteoporosis also. prolia treatments every 6 months  . Baker cyst, right 03/2020  . Cancer [facility]) 2013   Non-Hodgkin Lymphoma with chemo and rad tx  . Complication of anesthesia   . Dehydration    low sodium levels  . Dysrhythmia   . Essential tremor   . Follicular lymphoma grade I ([place]) 08/06/2002  . GERD (gastroesophageal reflux disease)   . Hearing loss of both ears    wears bilateral hearing aides  . 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 History of blood  transfusion   . History of chemotherapy   . History of radiation therapy   . Hypertension   . Labile blood pressure   . Meniere's disease    with bilateral hearing loss  . Mini stroke [facility])    summer 2014  . Osteoporosis   . PONV (postoperative nausea and vomiting)   . 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 Stroke [facility]) 2015   possible mini stroke. loss of conciousness while sitting at table. could not diagnose  . Syncope     PAST SURGICAL HISTORY:  Past Surgical History:  Procedure Laterality Date  . APPENDECTOMY    . BONE MARROW BIOPSY  08/09/2002  . BREAST LUMPECTOMY  1950s   benign  . CATARACT EXTRACTION  03/2009  . COLONOSCOPY    . CYSTOSCOPY WITH STENT PLACEMENT Right 03/29/2020   Procedure: CYSTOSCOPY WITH STENT PLACEMENT;  Surgeon: Hollice Espy, MD;  Location: [facility] ORS;  Service: Urology;  Laterality: Right;  . ESOPHAGOGASTRODUODENOSCOPY (EGD) WITH PROPOFOL N/A 09/23/2019   Procedure: ESOPHAGOGASTRODUODENOSCOPY (EGD) WITH PROPOFOL;  Surgeon: Jonathon Bellows, MD;  Location: [facility] ENDOSCOPY;  Service: Gastroenterology;  Laterality: N/A;  . FRACTURE SURGERY Left    wrist  . IR NEPHROSTOMY PLACEMENT RIGHT  04/13/2020  . LYMPH NODE BIOPSY  08/07/2002   cervical  . TUBAL LIGATION      HEMATOLOGY/ONCOLOGY HISTORY:  Oncology History  Follicular lymphoma ([place])  08/06/2002 Initial Diagnosis   Follicular lymphoma ([place])   04/16/2020 -  Chemotherapy   The patient had palonosetron (ALOXI) injection 0.25 mg, 0.25 mg,  Intravenous,  Once, 0 of 5 cycles obinutuzumab (GAZYVA) 1,000 mg in sodium chloride 0.9 % 250 mL (3.4483 mg/mL) chemo infusion, 1,000 mg, Intravenous, Once, 1 of 6 cycles Administration: 1,000 mg (04/16/2020)  for chemotherapy treatment.    Follicular lymphoma grade I ([place])  11/29/2017 Initial Diagnosis   Follicular lymphoma grade I ([place])   04/07/2018 - 01/05/2019 Chemotherapy   The patient had riTUXimab (RITUXAN) 600 mg in sodium chloride 0.9 % 250 mL (1.9355 mg/mL)  infusion, 375 mg/m2 = 600 mg, Intravenous,  Once, 3 of 8 cycles  for chemotherapy treatment.      ALLERGIES:  is allergic to lisinopril and primidone.  MEDICATIONS:  Current Outpatient Medications  Medication Sig Dispense Refill  . Acetaminophen (TYLENOL ARTHRITIS EXT RELIEF PO) Take 1 tablet by mouth daily as needed (pain).    [name] acyclovir (ZOVIRAX) 400 MG tablet Take 1 tablet (400 mg total) by mouth daily. 30 tablet 5  . allopurinol (ZYLOPRIM) 100 MG tablet Take 1 tablet (100 mg total) by mouth daily. 30 tablet 1  . AZELASTINE & FLUTICASONE NA 1 spray each nase twice daily.    . calcium-vitamin D (CALCIUM 500/D) 500-200 MG-UNIT tablet Take 1 tablet by mouth 2 (two) times daily.     [name] denosumab (PROLIA) 60 MG/ML SOSY injection Inject 60 mg into the skin every 6 (six) months.     . DEXILANT 60 MG capsule TAKE ONE CAPSULE BY MOUTH ONE TIME DAILY  90 capsule 1  . Flax Oil-Fish Oil-Borage Oil (FISH OIL-FLAX OIL-BORAGE OIL) CAPS Take 1,000-1,400 mg by mouth daily.    . Glucosamine HCl-MSM (GLUCOSAMINE-MSM PO) Take 1 tablet by mouth daily.     [name] HYDROcodone-acetaminophen (NORCO) 5-325 MG tablet 1/2 to 1 tablet every 4 hours as needed for pain. 40 tablet 0  . lenalidomide (REVLIMID) 5 MG capsule Take 1 capsule (5 mg total) by mouth daily. Take for 21 days, then hold for 7 days. Repeat every 28 days. 15 capsule 0  . Magnesium Citrate 125 MG CAPS Take 1 tablet by mouth daily.     . melatonin 5 MG TABS Take 5 mg by mouth at bedtime as needed.     . Multiple Vitamin (MULTIVITAMIN) tablet Take 1 tablet by mouth daily.    . Multiple Vitamins-Minerals (PRESERVISION AREDS 2 PO) Take 1 tablet by mouth daily.     . naproxen sodium (ALEVE) 220 MG tablet Take 220 mg by mouth daily as needed.     . ondansetron (ZOFRAN) 8 MG tablet Take 1 tablet (8 mg total) by mouth 2 (two) times daily. Start second day after chemotherapy. Then take as needed for nausea or vomiting. (Patient not taking: Reported on 04/20/2020)  30 tablet 1  . oxybutynin (DITROPAN) 5 MG tablet Take 1 tablet (5 mg total) by mouth every 8 (eight) hours as needed for bladder spasms. (Patient not taking: Reported on 04/18/2020) 30 tablet 0  . oxyCODONE (OXY IR/ROXICODONE) 5 MG immediate release tablet Take 1/2 or 1 pill by mouth every 6 hours as needed for pain. 20 tablet 0  . polyethylene glycol (MIRALAX / GLYCOLAX) 17 g packet Take 17 g by mouth daily.    . predniSONE (DELTASONE) 10 MG tablet Take 3 tablets (30 mg total) by mouth daily with breakfast. 30 tablet 0  . Probiotic Product (PROBIOTIC DAILY PO) Take 2 tablets by mouth daily.     . tamsulosin (FLOMAX) 0.4 MG CAPS capsule Take 1 capsule (0.4 mg total) by mouth daily. (Patient  not taking: Reported on 04/18/2020) 30 capsule 0  . triamcinolone cream (KENALOG) 0.1 % Apply 1 application topically as needed.     . Turmeric Curcumin 500 MG CAPS Take 1 capsule by mouth daily.      No current facility-administered medications for this visit.    VITAL SIGNS: There were no vitals taken for this visit. There were no vitals filed for this visit.  Estimated body mass index is 19.13 kg/m as calculated from the following:   Height as of 04/20/20: _0  (1.676 m).   Weight as of an earlier encounter on 04/23/20: 118 lb 8 oz (53.8 kg).  LABS: CBC:    Component Value Date/Time   WBC 7.4 04/23/2020 0802   HGB 10.4 (L) 04/23/2020 0802   HGB 11.2 04/03/2020 0952   HCT 30.3 (L) 04/23/2020 0802   HCT 41.2 08/28/2014 1217   PLT 210 04/23/2020 0802   PLT 226 08/28/2014 1217   MCV 92.7 04/23/2020 0802   MCV 100 08/28/2014 1217   NEUTROABS 6.4 04/23/2020 0802   NEUTROABS 5.2 08/28/2014 1217   LYMPHSABS 0.1 (L) 04/23/2020 0802   LYMPHSABS 1.0 08/28/2014 1217   MONOABS 0.7 04/23/2020 0802   MONOABS 0.6 08/28/2014 1217   EOSABS 0.1 04/23/2020 0802   EOSABS 0.1 08/28/2014 1217   BASOSABS 0.0 04/23/2020 0802   BASOSABS 0.1 08/28/2014 1217   Comprehensive Metabolic Panel:    Component Value  Date/Time   NA 126 (L) 04/23/2020 0802   NA 131 (L) 04/06/2020 1046   NA 125 (L) 11/26/2013 0954   K 3.8 04/23/2020 0802   K 3.2 (L) 11/26/2013 0954   CL 91 (L) 04/23/2020 0802   CL 91 (L) 11/26/2013 0954   CO2 25 04/23/2020 0802   CO2 29 11/26/2013 0954   BUN 24 (H) 04/23/2020 0802   BUN 29 (H) 04/06/2020 1046   BUN 10 11/26/2013 0954   CREATININE 1.02 (H) 04/23/2020 0802   CREATININE 0.70 11/26/2013 0954   GLUCOSE 162 (H) 04/23/2020 0802   GLUCOSE 96 11/26/2013 0954   CALCIUM 8.3 (L) 04/23/2020 0802   CALCIUM 8.5 11/26/2013 0954   AST 23 04/23/2020 0802   AST 35 11/26/2013 0954   ALT 21 04/23/2020 0802   ALT 29 11/26/2013 0954   ALKPHOS 135 (H) 04/23/2020 0802   ALKPHOS 61 11/26/2013 0954   BILITOT 0.7 04/23/2020 0802   BILITOT 0.4 11/26/2013 0954   PROT 5.8 (L) 04/23/2020 0802   PROT 6.0 (L) 11/26/2013 0954   ALBUMIN 3.3 (L) 04/23/2020 0802   ALBUMIN 3.3 (L) 11/26/2013 0954    RADIOGRAPHIC STUDIES: DG Chest 2 View  Result Date: 04/12/2020 CLINICAL DATA:  Shortness of breath and productive cough. EXAM: CHEST - 2 VIEW COMPARISON:  04/10/2020 FINDINGS: The cardiac silhouette, mediastinal and hilar contours are within normal limits and stable. Chronic pulmonary scarring changes with emphysema and areas of bronchiectasis. Small right upper lobe patchy airspace opacity could reflect a small infiltrate or area of atelectasis. No pleural effusions or worrisome pulmonary lesions. The bony thorax is intact. IMPRESSION: 1. Chronic underlying lung changes. 2. Persistent patchy right upper lobe airspace opacity, small infiltrate or area of atelectasis. Electronically Signed   By: Marijo Sanes M.D.   On: 04/12/2020 15:39   DG Chest 2 View  Result Date: 04/10/2020 CLINICAL DATA:  86 year old female with increasing shortness of breath for 1 week. EXAM: CHEST - 2 VIEW COMPARISON:  Chest x-ray 11/26/2013. FINDINGS: Lung volumes are normal. Emphysematous changes are  noted throughout the  lungs. Ill-defined opacity in the right mid to upper lung, new compared to the prior examination. No acute consolidative airspace disease. No pleural effusions. No evidence of pulmonary edema. No pneumothorax. Heart size is normal. Upper mediastinal contours are within normal limits. IMPRESSION: 1. Ill-defined opacity in the right mid to upper lung which may reflect early bronchopneumonia. Followup PA and lateral chest X-ray is recommended in 3-4 weeks following trial of antibiotic therapy to ensure resolution and exclude underlying malignancy. 2. Emphysema. Electronically Signed   By: Vinnie Langton M.D.   On: 04/10/2020 20:44   NM Pulmonary Perfusion  Result Date: 04/10/2020 CLINICAL DATA:  Shortness of breath 1 week. EXAM: NUCLEAR MEDICINE PERFUSION LUNG SCAN TECHNIQUE: Perfusion images were obtained in multiple projections after intravenous injection of radiopharmaceutical. Ventilation scans intentionally deferred if perfusion scan and chest x-ray adequate for interpretation during COVID 19 epidemic. RADIOPHARMACEUTICALS:  4.35 mCi Tc-35mMAA IV COMPARISON:  Chest x-ray today. FINDINGS: Examination demonstrates evidence of patient's known elevated left hemidiaphragm. Subtle nonsegmental streaky decreased uptake in the perihilar regions likely due in part to atelectasis. Possible small peripheral perfusion defect over the left apex and right upper lobe. No other peripheral segmental perfusion defects. IMPRESSION: Very low probability for pulmonary embolism. Electronically Signed   By: DMarin OlpM.D.   On: 04/10/2020 14:02   Ultrasound renal complete  Result Date: 04/07/2020 CLINICAL DATA:  Known right hydronephrosis. EXAM: RENAL / URINARY TRACT ULTRASOUND COMPLETE COMPARISON:  CT scan March 21, 2020 FINDINGS: Right Kidney: Renal measurements: 10.1 x 5.2 x 5.6 cm = volume: 152.9 mL. There is cortical thinning. Moderate to severe hydronephrosis is again identified. The nephroureteral stent is  identified. Left Kidney: Renal measurements: 10 x 3.7 x 4.6 cm = volume: 88 mL. Echogenicity within normal limits. No mass or hydronephrosis visualized. Bladder: The nephroureteral stent is seen within the bladder. The bladder is poorly distended. Other: None. IMPRESSION: 1. Nephroureteral stent extending from the region of the right renal pelvis to the bladder. Continued moderate to severe right hydronephrosis and cortical thinning. Electronically Signed   By: DDorise BullionIII M.D   On: 04/07/2020 20:04   NM PET Image Restag (PS) Skull Base To Thigh  Result Date: 04/05/2020 CLINICAL DATA:  Subsequent treatment strategy for grade 1 lymphoma. Follicular lymphoma. EXAM: NUCLEAR MEDICINE PET SKULL BASE TO THIGH TECHNIQUE: 6.3 mCi F-18 FDG was injected intravenously. Full-ring PET imaging was performed from the skull base to thigh after the radiotracer. CT data was obtained and used for attenuation correction and anatomic localization. Fasting blood glucose: 108 mg/dl COMPARISON:  CT 03/21/2020, PET-CT 11/08/2018 FINDINGS: Mediastinal blood pool activity: SUV max 166 Liver activity: SUV max 227 NECK: No hypermetabolic lymph nodes in the neck. Incidental CT findings: none CHEST: Cluster of lymph nodes in the prevascular space with hypermetabolic activity (SUV max equal 4.2). Activity greater than liver. Individually nodes measure up to 13 mm short axis (image 77/5). Incidental CT findings: Bronchiectasis in the LEFT upper lobe with some consolidation similar prior. Thickening along the LEFT superior oblique fissure is new but without significant metabolic activity (image 572/ ABDOMEN/PELVIS: Hypermetabolic periaortic retroperitoneal lymph nodes. The most intense lymph node position between the IVC and the aorta at the level the kidneys with SUV max equal 5.7. This node measures 22 cm (image 152/5). A similar lymph node LEFT aorta measuring 1.6 cm with SUV max equal 3.7. these retroperitoneal lymph nodes have  metabolic activity greater than liver activity (SUV max  equal 2.3). NO Clear hypermetabolic pelvic lymph nodes. No hypermetabolic inguinal nodes. Incidental CT findings: RIGHT ureteral stent in place with moderate hydronephrosis. Or renal excretion on the RIGHT compared to the LEFT SKELETON: No focal hypermetabolic activity to suggest skeletal metastasis. Incidental CT findings: none IMPRESSION: 1. Hypermetabolic prevascular lymph nodes new from comparison PET CT 11/08/2018 consistent with new lymphoma recurrence ([place]). 2. New hypermetabolic periaortic lymphadenopathy ([place] 5) 3. Normal spleen 4. Normal marrow. 5. Hydronephrosis of the RIGHT renal collecting system with stent in place. Electronically Signed   By: Suzy Bouchard M.D.   On: 04/05/2020 15:38   US Venous Img Lower Unilateral Right  Addendum Date: 04/10/2020   ADDENDUM REPORT: 04/10/2020 13:37 ADDENDUM: Incidentally noted approximately 3.4 x 0.8 x 2.9 cm anechoic fluid collection with the right popliteal fossa compatible with a Baker's cyst. Electronically Signed   By: Sandi Mariscal M.D.   On: 04/10/2020 13:37   Result Date: 04/10/2020 CLINICAL DATA:  Right lower extremity pain and edema. History of lymphoma. Evaluate for DVT. EXAM: RIGHT LOWER EXTREMITY VENOUS DOPPLER ULTRASOUND TECHNIQUE: Gray-scale sonography with graded compression, as well as color Doppler and duplex ultrasound were performed to evaluate the lower extremity deep venous systems from the level of the common femoral vein and including the common femoral, femoral, profunda femoral, popliteal and calf veins including the posterior tibial, peroneal and gastrocnemius veins when visible. The superficial great saphenous vein was also interrogated. Spectral Doppler was utilized to evaluate flow at rest and with distal augmentation maneuvers in the common femoral, femoral and popliteal veins. COMPARISON:  Right lower extremity venous Doppler ultrasound-03/14/2020. FINDINGS:  Contralateral Common Femoral Vein: Respiratory phasicity is normal and symmetric with the symptomatic side. No evidence of thrombus. Normal compressibility. Common Femoral Vein: No evidence of thrombus. Normal compressibility, respiratory phasicity and response to augmentation. Saphenofemoral Junction: No evidence of thrombus. Normal compressibility and flow on color Doppler imaging. Profunda Femoral Vein: No evidence of thrombus. Normal compressibility and flow on color Doppler imaging. Femoral Vein: No evidence of thrombus. Normal compressibility, respiratory phasicity and response to augmentation. Popliteal Vein: No evidence of thrombus. Normal compressibility, respiratory phasicity and response to augmentation. Calf Veins: No evidence of thrombus. Normal compressibility and flow on color Doppler imaging. Superficial Great Saphenous Vein: No evidence of thrombus. Normal compressibility. Venous Reflux:  None. Other Findings:  None. IMPRESSION: No evidence of DVT within the right lower extremity. Electronically Signed: By: Sandi Mariscal M.D. On: 04/10/2020 12:40   DG C-Arm 1-60 Min-No Report  Result Date: 03/29/2020 Fluoroscopy was utilized by the requesting physician.  No radiographic interpretation.   IR NEPHROSTOMY PLACEMENT RIGHT  Result Date: 04/13/2020 INDICATION: 86 year old female with persisting hydronephrosis after stent placement EXAM: IMAGE GUIDED PLACEMENT OF RIGHT PERCUTANEOUS NEPHROSTOMY COMPARISON:  None. MEDICATIONS: NONE ANESTHESIA/SEDATION: Fentanyl 0.5 mcg IV; Versed 25 mg IV Moderate Sedation Time:  15 minutes The patient was continuously monitored during the procedure by the interventional radiology nurse under my direct supervision. CONTRAST:  10 cc-administered into the collecting system(s) FLUOROSCOPY TIME:  Fluoroscopy Time: 0 minutes 48 seconds (3.7 mGy). COMPLICATIONS: None PROCEDURE: Informed written consent was obtained from the patient after a thorough discussion of the  procedural risks, benefits and alternatives. All questions were addressed. Maximal Sterile Barrier Technique was utilized including caps, mask, sterile gowns, sterile gloves, sterile drape, hand hygiene and skin antiseptic. A timeout was performed prior to the initiation of the procedure. Patient positioned prone position on the fluoroscopy table. Ultrasound survey of the right  flank was performed with images stored and sent to PACs. The patient was then prepped and draped in the usual sterile fashion. 1% lidocaine was used to anesthetize the skin and subcutaneous tissues for local anesthesia. A Chiba needle was then used to access a posterior inferior calyx with ultrasound guidance. With spontaneous urine returned through the needle, passage of an 018 micro wire into the collecting system was performed under fluoroscopy. A small incision was made with an 11 blade scalpel, and the needle was removed from the wire. An Accustick system was then advanced over the wire into the collecting system under fluoroscopy. The metal stiffener and inner dilator were removed, and then a sample of fluid was aspirated through the 4 French outer sheath. Bentson wire was passed into the collecting system and the sheath removed. Ten French dilation of the soft tissues was performed. Using modified Seldinger technique, a 10 French pigtail catheter drain was placed over the Bentson wire. Wire and inner stiffener removed, and the pigtail was formed in the collecting system. Small amount of contrast confirmed position of the catheter. Patient tolerated the procedure well and remained hemodynamically stable throughout. No complications were encountered and no significant blood loss encountered IMPRESSION: Status post right-sided percutaneous nephrostomy. Signed, Dulcy Fanny. Dellia Nims, RPVI [facility] Electronically Signed   By: Corrie Mckusick D.O.   On: 04/13/2020 12:14     PERFORMANCE STATUS (ECOG) : 2 - Symptomatic, <50% confined to bed  Review of Systems Unless otherwise noted, a complete review of systems is negative.  Physical Exam General: NAD, thin Pulmonary: Unlabored Extremities: no edema, no joint deformities Skin: no rashes Neurological: Weakness but otherwise nonfocal  IMPRESSION: I met with patient in the infusion area.  I introduced palliative care services and attempted to establish therapeutic rapport.  Patient says that she has been having persistent right-sided abdominal pain over the past month.  Pain is worse at night and with movement.  She says the pain averages 7 out of 10 and will decrease to as low as 5 out of 10 with pain medications but can be as high as 9 out of 10 without use of medications.  She says the pain medications are short-lived.  She denies any adverse effects from pain medication.  She has chronic constipation, which is managed with use of daily MiraLAX.  Patient says that she was trying to keep a pain journal but has not been consistent with this.  She does say that she was taking Norco every 4 hours around-the-clock.  Patient was rotated to oxycodone last week by Dr. Mike Gip with intent of minimizing use of acetaminophen.  However, patient has continued to take oxycodone around-the-clock.  Patient would be interested in starting a long-acting pain medication with hope of minimizing use of round-the-clock dosing of short acting medications.   Note that patient does not have medication coverage under her health insurance.  As such, we will try to avoid expensive/brand-name LAO's such as OxyContin or fentanyl.  We will start MS Contin 15 mg every 12 hours.  Patient may continue to take oxycodone as needed for breakthrough pain.  Discussed potential need for increasing her bowel regimen for constipation management.   With patient's permission, I called and spoke with her husband by phone.  Both patient and husband's  questions were answered to their verbalized satisfaction.  PDMP reviewed  PLAN: -Continue current scope of treatment -Start MS Contin 15 mg every 12 hours (#30) -Continue oxycodone  5 mg every 4-6 hours as needed for breakthrough pain -Continue bowel regimen -Pain diary -Will address ACP during future visit -RTC in 1 week   Patient expressed understanding and was in agreement with this plan. She also understands that She can call the clinic at any time with any questions, concerns, or complaints.     Time Total: 45 minutes  Visit consisted of counseling and education dealing with the complex and emotionally intense issues of symptom management and palliative care in the setting of serious and potentially life-threatening illness.Greater than 50%  of this time was spent counseling and coordinating care related to the above assessment and plan.  Signed by: Altha Harm, PhD, NP-C

## 2020-04-23 NOTE — Telephone Encounter (Signed)
New patient referral to dr Holley Raring office for Hyponatremia fax conformation received.[name]

## 2020-04-25 NOTE — Progress Notes (Signed)
[facility]  [address], [address] [place], [place] [address] Phone: [phone]  Fax: [phone]   Clinic Day:  04/27/2020  Referring physician: Marinda Elk, MD  Chief Complaint: [name] is a 86 y.o. female with a history of stage IIIA follicular non-Hodgkin's lymphoma and right sided hydronephrosis s/p external stent who is seen for assessment on day 12 of cycle #1 obinutuzumab and Revlimid.   HPI: The patient was last seen in the medical oncology clinic on 04/20/2020. At that time, she was doing fair. Pain was modestly controlled. She had chronic right lower extremity edema.  We discussed adjusting her pain medications secondary to her increased use of Tylenol.  She received an Rx for oxycodone.  She was to keep a pain diary.  She received day 8 of cycle #1 obinutuzumab on 04/23/2020.  CBC revealed a hematocrit of 30.3, hemoglobin 10.4, MCV 92.7, platelets 210,000, WBC 7400, ANC 6400.   She was seen by Altha Harm for supportive care and pain management on 04/23/2020.  She began MS Contin 15 mg every 12 hours.  She was to continue oxycodone 5 mg every 4-6 hours prn.  A bowel regimen was reviewed to prevent narcotic induced constipation.  She has a follow-up appointment in 1 week.  Symptomatically, she states that she is not doing too well and feels "terrible".  She feels incredibly weak and "shaky". The only time she is comfortable is when she sleeps. She hasn't been eating and has been constipated.  She has been taking Miralax, stool softeners, and suppositories. She admits to nausea. Her pain level is an 8 out of 10 today. Pain does not get much better than an 8. She didn't take the MS Contin this morning because she wanted to attend her appointment today; she states that themorphine to make her sleep a lot. She spends 2/3 of the day sleeping. The only thing she wakes up for is to try to eat, drink, and use the bathroom. The last time she  had a bowel movement was 2 days ago; it wasn't normal.   She continues to have trouble swallowing.  Mucus comes to her throat when she coughs. She thinks her urine output is normal considering the amount of fluids she drinks. She is taking Revlimid. She is not on aspirin. The infusions tend to make her sleepy due to the Benadryl premedication. The day following her infusions is when she feels "the worst". She feels frustrated and says it feels difficult to live since she feels like she gets worse every week.   The swelling in her right leg continues.  Her leg is weeping. Her husband says the fluid has moved a little higher than her ankle. He wraps her leg to keep it dry.   She usually tries to eat fruit or half a piece of toast for breakfast. She tries to balance her meals with something warm and something cold. Her husband says this morning she ate 1/4 of a bagel, two strawberries, and a cup of tea. Her husband says she barely eats enough to sustain herself. She notes her lower back feels itchy.  She agrees to admission to the hospital to address these issues.    Past Medical History:  Diagnosis Date   Arthritis    osteoporosis also. prolia treatments every 6 months   Baker cyst, right 03/2020   Cancer [facility]) 2013   Non-Hodgkin Lymphoma with chemo and rad tx   Complication of anesthesia  Dehydration    low sodium levels   Dysrhythmia    Essential tremor    Follicular lymphoma grade I (HCC) 08/06/2002   GERD (gastroesophageal reflux disease)    Hearing loss of both ears    wears bilateral hearing aides   Hemolytic uremic syndrome ([place]) 08/13/2015   (E coli 0157)   History of blood transfusion    History of chemotherapy    History of radiation therapy    Hypertension    Labile blood pressure    Meniere's disease    with bilateral hearing loss   Mini stroke [facility])    summer 2014   Osteoporosis    PONV (postoperative nausea and vomiting)    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 loss of conciousness while sitting at table. could not diagnose   Syncope     Past Surgical History:  Procedure Laterality Date   APPENDECTOMY     BONE MARROW BIOPSY  08/09/2002   BREAST LUMPECTOMY  1950s   benign   CATARACT EXTRACTION  03/2009   COLONOSCOPY     CYSTOSCOPY WITH STENT PLACEMENT Right 03/29/2020   Procedure: CYSTOSCOPY WITH STENT PLACEMENT;  Surgeon: Hollice Espy, MD;  Location: [facility] ORS;  Service: Urology;  Laterality: Right;   ESOPHAGOGASTRODUODENOSCOPY (EGD) WITH PROPOFOL N/A 09/23/2019   Procedure: ESOPHAGOGASTRODUODENOSCOPY (EGD) WITH PROPOFOL;  Surgeon: Jonathon Bellows, MD;  Location: [facility] ENDOSCOPY;  Service: Gastroenterology;  Laterality: N/A;   FRACTURE SURGERY Left    wrist   IR NEPHROSTOMY PLACEMENT RIGHT  04/13/2020   LYMPH NODE BIOPSY  08/07/2002   cervical   TUBAL LIGATION      Family History  Problem Relation Age of Onset   Cancer Father        Throat   Cancer Sister 53       Colon    Social History:  reports that she has never smoked. She has never used smokeless tobacco. She reports that she does not drink alcohol or use drugs. She lives in [place].She and her husband are getting restless staying at home during the pandemic. The patient is accompanied by her husband today.  Allergies:  Allergies  Allergen Reactions   Lisinopril Cough   Primidone Other (See Comments)    Makes her feel "loopy and unsteady" Also, did not help the tremors    Current Medications: Current Outpatient Medications  Medication Sig Dispense Refill   Acetaminophen (TYLENOL ARTHRITIS EXT RELIEF PO) Take 1 tablet by mouth daily as needed (pain).     acyclovir (ZOVIRAX) 400 MG tablet Take 1 tablet (400 mg total) by mouth daily. 30 tablet 5   allopurinol (ZYLOPRIM) 100 MG tablet Take 1 tablet (100 mg total) by mouth daily. 30 tablet 1   AZELASTINE & FLUTICASONE NA 1 spray each nase twice  daily.     calcium-vitamin D (CALCIUM 500/D) 500-200 MG-UNIT tablet Take 1 tablet by mouth 2 (two) times daily.      denosumab (PROLIA) 60 MG/ML SOSY injection Inject 60 mg into the skin every 6 (six) months.      DEXILANT 60 MG capsule TAKE ONE CAPSULE BY MOUTH ONE TIME DAILY  90 capsule 1   Flax Oil-Fish Oil-Borage Oil (FISH OIL-FLAX OIL-BORAGE OIL) CAPS Take 1,000-1,400 mg by mouth daily.     Glucosamine HCl-MSM (GLUCOSAMINE-MSM PO) Take 1 tablet by mouth daily.      lenalidomide (REVLIMID) 5 MG capsule Take 1 capsule (5 mg total) by  mouth daily. Take for 21 days, then hold for 7 days. Repeat every 28 days. 15 capsule 0   Magnesium Citrate 125 MG CAPS Take 1 tablet by mouth daily.      melatonin 5 MG TABS Take 5 mg by mouth at bedtime as needed.      morphine (MS CONTIN) 15 MG 12 hr tablet Take 1 tablet (15 mg total) by mouth every 12 (twelve) hours. 30 tablet 0   Multiple Vitamin (MULTIVITAMIN) tablet Take 1 tablet by mouth daily.     Multiple Vitamins-Minerals (PRESERVISION AREDS 2 PO) Take 1 tablet by mouth daily.      naproxen sodium (ALEVE) 220 MG tablet Take 220 mg by mouth daily as needed.      oxyCODONE (OXY IR/ROXICODONE) 5 MG immediate release tablet Take 1/2 or 1 pill by mouth every 6 hours as needed for pain. 20 tablet 0   polyethylene glycol (MIRALAX / GLYCOLAX) 17 g packet Take 17 g by mouth daily.     predniSONE (DELTASONE) 10 MG tablet Take 3 tablets (30 mg total) by mouth daily with breakfast. 30 tablet 0   Probiotic Product (PROBIOTIC DAILY PO) Take 2 tablets by mouth daily.      triamcinolone cream (KENALOG) 0.1 % Apply 1 application topically as needed.      Turmeric Curcumin 500 MG CAPS Take 1 capsule by mouth daily.      ondansetron (ZOFRAN) 8 MG tablet Take 1 tablet (8 mg total) by mouth 2 (two) times daily. Start second day after chemotherapy. Then take as needed for nausea or vomiting. (Patient not taking: Reported on 04/20/2020) 30 tablet 1    oxybutynin (DITROPAN) 5 MG tablet Take 1 tablet (5 mg total) by mouth every 8 (eight) hours as needed for bladder spasms. (Patient not taking: Reported on 04/18/2020) 30 tablet 0   tamsulosin (FLOMAX) 0.4 MG CAPS capsule Take 1 capsule (0.4 mg total) by mouth daily. (Patient not taking: Reported on 04/18/2020) 30 capsule 0   No current facility-administered medications for this visit.    Review of Systems  Constitutional: Positive for malaise/fatigue. Negative for chills, diaphoresis, fever and weight loss (stable).       Feels "terrible".  HENT: Negative.  Negative for congestion, ear discharge, ear pain, hearing loss, sinus pain, sore throat and tinnitus.        Thick secretions in throat.  Eyes: Negative.  Negative for blurred vision, double vision and photophobia.  Respiratory: Positive for cough (tries to clear mucus). Negative for hemoptysis, sputum production, shortness of breath, wheezing and stridor.   Cardiovascular: Positive for leg swelling (right leg and foot; wrapping right lower extremity secondary to weeping). Negative for chest pain, palpitations and orthopnea.  Gastrointestinal: Positive for abdominal pain (right sided associated with known adenopathy). Negative for blood in stool, constipation (secondary to pain medications; takes MiraLax and stool softeners), diarrhea, heartburn, melena, nausea and vomiting.       Dysphagia associated with non-compressive upper airway adenonopathy.  Thick saliva.  Poor appetite.  Genitourinary: Negative for dysuria, frequency, hematuria (resolved) and urgency.       S/p right external nephrostomy tube placement.  Musculoskeletal: Positive for myalgias (right lower extremity). Negative for back pain, falls, joint pain and neck pain.  Skin: Positive for itching (lower back). Negative for rash.  Neurological: Positive for tremors (essential) and weakness (generalized). Negative for dizziness, tingling, sensory change, speech change, focal weakness  and headaches.  Endo/Heme/Allergies: Negative.  Negative for environmental allergies. Does not  bruise/bleed easily.  Psychiatric/Behavioral: Positive for depression. Negative for memory loss and suicidal ideas. The patient has insomnia (sleeping better with improved pain control). The patient is not nervous/anxious.   All other systems reviewed and are negative.  Performance status (ECOG):  2-3  Vitals Blood pressure 129/63, pulse 80, temperature (!) 95.8 F (35.4 C), temperature source Tympanic, resp. rate 16, height _0  (1.676 m), SpO2 98 %.   Physical Exam  Constitutional: She is oriented to person, place, and time. She appears well-developed and well-nourished. No distress. Face mask in place.  Thin, fatigued appearing, elderly woman sitting in a wheelchair in no acute distress.  She requires 2 person assistance to sit onto the exam table.  HENT:  Head: Normocephalic and atraumatic.  Mouth/Throat: Mucous membranes are normal. No oral lesions. No oropharyngeal exudate.  Styled gray hair.  Eyes: Pupils are equal, round, and reactive to light. Conjunctivae and EOM are normal. Right eye exhibits no discharge. Left eye exhibits no discharge. No scleral icterus.  Gold rimmed glasses. Blue eyes.   Neck: No JVD present.  Cardiovascular: Normal rate, regular rhythm and normal heart sounds. Exam reveals no gallop and no friction rub.  No murmur heard. Pulmonary/Chest: Effort normal and breath sounds normal. No respiratory distress. She has no wheezes. She has no rhonchi. She has no rales.  Abdominal: Soft. Normal appearance and bowel sounds are normal. She exhibits no distension and no mass. There is no hepatosplenomegaly. There is no rebound, no guarding and no CVA tenderness.  Genitourinary:    Genitourinary Comments: Right external nephrostomy tube with C/D/I dressing.   Musculoskeletal:        General: Edema present. No tenderness. Normal range of motion.     Cervical back: Normal range  of motion and neck supple.     Comments: Bilateral lower extremity edema (right > left) with associated tenderness.  Right thigh edema- stable.  Lymphadenopathy:       Head (right side): No preauricular, no posterior auricular and no occipital adenopathy present.       Head (left side): No preauricular, no posterior auricular and no occipital adenopathy present.    She has no cervical adenopathy.    She has no axillary adenopathy.       Right: No inguinal and no supraclavicular adenopathy present.       Left: Supraclavicular (1 cm mobile- stable) adenopathy present. No inguinal adenopathy present.  Neurological: She is alert and oriented to person, place, and time.  Skin: Skin is warm, dry and intact. No bruising, no lesion and no rash noted. She is not diaphoretic. No erythema. No pallor.  Large anterior tibial fluid filled bullae.  Psychiatric: She has a normal mood and affect. Her behavior is normal. Judgment and thought content normal.  Nursing note and vitals reviewed.   Imaging studies: 11/08/2018:  PET scanrevealed a 5.5 x 7.3 cm hypermetabolic (SUV 35.4) right retroperitoneal nodal mass and aortocaval lymph node.  03/11/2019:  Abdomen and pelvis CTrevealed partial treatment response. Right retroperitoneal nodal mass and aortocaval adenopathy haddecreased in size (7.1 x 5.8 cm to 6.5 x 4.1 cm).There was decreased mass effect on the IVC. There were no new sites of disease. 09/12/2019:  Abdomen and pelvis CTrevealed a significant interval decrease in size of a retroperitoneal massthat measured 4.6 x 2.8 cm (previously 6.5 x 4.1 cm). There was redemonstrated, adjacent post treatment retroperitoneal soft tissue about the inferior vena cava and aorta. Findings were consistent with ongoing treatment response. There was  no discretely enlarged lymph nodes in the abdomen or pelvis. 03/21/2020:  Abdomen and pelvis CT revealed moderate right hydroureteronephrosis which appearedto extend  to the L5 level, but withoutdefinite obstructing calculus. The cause ofthedistal ureteral obstructionwasunknown. There was sigmoid diverticulosis without inflammationand several small calcified uterine fibroids.Appears to be interval development of retroperitoneal adenopathy. Left periaortic lymph node measures 2 cm. There is a 2.6 cm right paratracheal node. There is probable bilateral iliac adenopathy. Adenopathy may be the cause of distal right ureteral obstruction. 04/05/2020:  PET scan revealed hypermetabolic prevascular lymph nodes (nodes up to 13 mm; SUV 4.2) new from comparison PET CT 11/08/2018 c/w new lymphoma recurrence ([place] 5). There was new hypermetabolic periaortic lymphadenopathy (Deau[place] 5); most intense node was 2.2 cm between IVC and aorta at the level of the kidneys (SUV 5.7).  There was a similar 1.6 cm left aorta node (SUV 3.7).  Spleen and marrow were normal. There was hydronephrosis of the RIGHT renal collecting system with stent in place.  10/27/2017:  Bone densityrevealed osteoporosiswith a T-score of -3.8 in the left forearm radius.  11/07/2019:  Bonedensityrevealed osteoporosis with a T score of -2.5 in the right femoral neck. 03/14/2020:  Right lower extremity duplex revealed no evidence of DVT. 04/10/2020:  Right lower extremity duplex revealed no DVT.  There was a 3.4 x 0.8 x 2.9 cm anechoic fluid collection with the right popliteal fossa compatible with a Baker's cyst.  04/10/2020:  Perfusion lung scan revealed very low probability for pulmonary embolism.  04/10/2020:  CXR revealed Ill-defined opacity in the right mid to upper lung which may reflect early bronchopneumonia.  04/12/2020:  CXR revealed chronic underlying lung changes. There was persistent patchy right upper lobe airspace opacity felt secondary to a small infiltrate or area of atelectasis.    No visits with results within 3 Day(s) from this visit.  Latest known visit with results  is:  Infusion on 04/23/2020  Component Date Value Ref Range Status   Color, Urine 04/23/2020 YELLOW* YELLOW Final   APPearance 04/23/2020 HAZY* CLEAR Final   Specific Gravity, Urine 04/23/2020 1.006  1.005 - 1.030 Final   pH 04/23/2020 6.0  5.0 - 8.0 Final   Glucose, UA 04/23/2020 NEGATIVE  NEGATIVE mg/dL Final   Hgb urine dipstick 04/23/2020 NEGATIVE  NEGATIVE Final   Bilirubin Urine 04/23/2020 NEGATIVE  NEGATIVE Final   Ketones, ur 04/23/2020 5* NEGATIVE mg/dL Final   Protein, ur 04/23/2020 NEGATIVE  NEGATIVE mg/dL Final   Nitrite 04/23/2020 NEGATIVE  NEGATIVE Final   Leukocytes,Ua 04/23/2020 SMALL* NEGATIVE Final   RBC / HPF 04/23/2020 0-5  0 - 5 RBC/hpf Final   WBC, UA 04/23/2020 21-50  0 - 5 WBC/hpf Final   Bacteria, UA 04/23/2020 RARE* NONE SEEN Final   Squamous Epithelial / LPF 04/23/2020 NONE SEEN  0 - 5 Final   Performed at [facility], [address], [place], [place] [address]    Assessment:  [name] is a 86 y.o. female with a history of stage IIIA follicular non-Hodgkin's lymphoma. She presented in the summer of 2003 with an epigastric and left upper quadrant mass and left arm weakness with decreased mobility. Abdominal ultrasound revealed a large mass in the head of the pancreas with multiple enlarged lymph nodes and a large lobulated mass in the right retroperitoneal area. MRI of the left brachial plexus revealed a 2.5 cm soft tissue mass and abnormal lymph node along the left brachial plexus and in the supraclavicular region.  Cervical node  biopsyon 08/06/2002 revealed a grade I follicular non-Hodgkin's lymphoma. Bone marrowon 08/09/2002 was negative. Lumbar puncture was negative. She receivedLeukeran and Rituxan. As her arm pain and mobility was not relieved, she received local radiationto the left brachial plexus area. Leukeran continued through 02/28/2013 (discontinued secondary side effects).  She developed progressive  disease in 10/2003. Patient received CVPchemotherapy beginning 01/02/2004. In 06/2004 she was switched to CNOP. She received 8 cycles completing on 01/15/2005. She began maintenance Rituxanon 04/23/2004 (4 weekly doses every 3 months for 2 years). Therapy completed on 09/13/2008.   Excision of a 1 cm right preauricular masson 10/04/2017 revealed a low grade follicular lymphoma. CT guided biopsyof the retroperitoneal nodal masson 11/20/2017 revealed grade I follicular lymphoma. Immunohistochemistry revealed positive for CD10, BCL-2, BCL-6, Ki67 (15-20%) and negative for cyclin D1 and CD5. Flow cytometry revealed a CD10+ clonal B-cell population.   She received 4 weekly cycles of Rituxan(12/24/2017 - 01/14/2018) and 3 cycles of maintenance Rituxan(03/24/2018 - 10/08/2018).  PET scanon 11/08/2018 revealed a 5.5 x 7.3 cm hypermetabolic (SUV 32.9) right retroperitoneal nodal mass and aortocaval lymph node. CT guided biopsyon 51/88/4166 confirmed follicular lymphoma, grade I.  She received radiation(IMRT) of 3000 cGy to the retroperitoneal mass from 01/11/2019 - 01/31/2019.  The following studies were negativeon 10/23/2017 and 04/03/2020: hepatitis B core antibody, hepatitis B surface antigen, hepatitis C antibody. G6PD assay was normal on 04/03/2020.  She presented with a 1 month history of abdominal discomfort. Creatinine has increased. She had new right sided hydroureteronephrosis.  Abdomen and pelvis CTon 03/21/2020 revealed moderate right hydroureteronephrosis which appearedto extend to the L5 level, but withoutdefinite obstructing calculus. The cause ofthedistal ureteral obstructionwasunknown. There was sigmoid diverticulosis without inflammationand several small calcified uterine fibroids.Appears to be interval development of retroperitoneal adenopathy. Left periaortic lymph node measures 2 cm. There is a 2.6 cm right paratracheal node. There is probable  bilateral iliac adenopathy. Adenopathy may be the cause of distal right ureteral obstruction.  PET scan on 04/05/2020 revealed hypermetabolic prevascular lymph nodes (nodes up to 13 mm; SUV 4.2) new from comparison PET CT 11/08/2018 c/w new lymphoma recurrence ([place] 5). There was new hypermetabolic periaortic lymphadenopathy ([place] 5); most intense node was 2.2 cm between IVC and aorta at the level of the kidneys (SUV 5.7).  There was a similar 1.6 cm left aorta node (SUV 3.7).  Spleen and marrow were normal. There was hydronephrosis of the RIGHT renal collecting system with stent in place  She is day 12 of cycle #1 obinutuzumab ([place]) and Revlimid.  Revlimid began on 04/19/2020.  She underwent right ureteral stent placement on 03/29/2020 followed by external nephrostomy tube placement on 04/13/2020.  Internal nephrostomy tube was removed on 04/18/2020.  She hasright lower extremity edema. Right lower extremity duplex on 03/14/2020 and 04/10/2020 revealed no evidence of DVT.  Perfusion scan on 04/10/2020 revealed low probability of pulmonary embolism.  She was admitted to [facility] 08/15/2015 - 08/25/2015 with bloody diarrhea then hemolytic uremic syndromerelated to E. coli 0157. She had a complicated course with acute renal failure and anemia. She was then in rehab for 3 weeks.   Bonedensityon 11/07/2019 revealed osteoporosis with a T score of -2.5 in the right femoral neck.She began every 6 month Proliaon 04/20/2018 (last01/03/2020).  Symptomatically, she feels terrible.  She is eating and drinking poorly secondary to swallowing issues.  She is sleeping more.  Pain is fairly well controlled.  She has bilateral lower extremity edema (right > left).   Sodium is 124.  Plan: 1.  Labs today:  CBC with diff, CMP, Mg. 2.   Stage IIIAfollicular non-Hodgkin's lymphoma Clinically, she is fatigued.  Performance status has declined.  She has been treated with CVP, CNOP,  Rituxan, and IMRT.  She has progressive disease.  She is currently day 12 of cycle #1 obinutuzumab and Revlimid.       Revlimid 5 mg po q day on days 1-21 of cycles 1-6.   Obinutuzumab 1000 mg IV on days 1, 8, 15 of cycle #1 then 1000 mg on day 1 of cycles #2-6.  She has tolerated her infusions of obinutuzumab well.  She continues daily Revlimid.  Continue prophylactic medications and dose adjust for renal insufficiency.   Allopurinol 100 mg a day.   Acyclovir 400 mg a day (possible increase to BID if renal function improved).   Begin Septra SS on Mondays, Wednesdays and Fridays.   Begin baby aspirin (consider low dose Eliquis based on renal function) as hematuria has resolved.  Discuss current symptom management.  Patient has struggled this week and is failing to thrive.  Discuss inpatient management.  Patient and husband are agreeable.   3. Right hydroureteronephrosis Adenopathy caused obstruction. Patient is s/p internal then external nephrostomy tube placement.   Renal function has improved (Cr 0.83; CrCl 42.6 ml/min). 4. Right lower extremity edema Etiology secondary to obstructive adenopathy in abdomen/pelvis limiting venous return. Duplex on 04/10/2020 revealed no evidence of DVT. VQ scan on 03/21/2020 revealed no evidence of pulmonary embolism.  Begin baby aspirin (81 mg/day) or low dose Eliquis (2.5 mg po BID) secondary to slow flow and risk of thrombosis.  5.   Cancer-related pain  Patient was taking up to 1950 mg Tyelenol/day.  She is currently on MS Contin 15 mg po BID and oxycodone 5 mg po q 4-6 hrs prn pain.  She notes MSContin makes her sleepy.  She may do better with MS Contin only at night (to help her manage the pain and sleep).  Discuss management of constipation.  Patient to be seen by Billey Chang, NP in palliative care medicine- he is aware. 6.   Failure to Thrive  Patient eating and drinking poorly  secondary to fatigue and difficulty swallowing felt secondary to adenopathy near the esophagus.  Consider swallowing study by speech or barium swallow.  Patient followed by Dr Vicente Males in gastroenterology. 7.   Right lower extremity weeping  Patient's husband is currently wrapping her right lower extremity.  Wound care evaluation.. 8.   Hyponatremia  Etiology secondary to dehydration because of poor fluid intake.  Gentle hydration of NS 150 cc/hr while in clinic. 9.   Code status  Discussed with patient and her husband today.  She does not wish heroic measures.  Code status is DNR/DNI. 10.   Direct admission to the hospital (recurrent follicular NHL, FTT, pain management, wound care)- report called to Dr Johnna Acosta.  I discussed the assessment and treatment plan with the patient.  The patient was provided an opportunity to ask questions and all were answered.  The patient agreed with the plan and demonstrated an understanding of the instructions.  The patient was advised to call back if the symptoms worsen or if the condition fails to improve as anticipated.  I provided 20 minutes (10:00 AM - 10:20 AM) of face-to-face time during this this encounter and > 50% was spent counseling as documented under my assessment and plan.  An additional 20 minutes were spent talking to the hospitalist, the patient's husband, and the nurses  in the infusion center.   Lequita Asal, MD, PhD    04/27/2020, 10:00 AM  I, Heywood Footman, am acting as a Education administrator for Lequita Asal, MD.  I, [place] Mike Gip, MD, have reviewed the above documentation for accuracy and completeness, and I agree with the above.

## 2020-04-27 ENCOUNTER — Inpatient Hospital Stay
Admission: AD | Admit: 2020-04-27 | Discharge: 2020-05-01 | DRG: 641 | Disposition: A | Discharge status: Hospice | Payer: Medicare Other | Source: Ambulatory Visit | Attending: Internal Medicine | Admitting: Internal Medicine

## 2020-04-27 ENCOUNTER — Encounter: Payer: Self-pay | Admitting: Hematology and Oncology

## 2020-04-27 ENCOUNTER — Ambulatory Visit (INDEPENDENT_AMBULATORY_CARE_PROVIDER_SITE_OTHER)
Admission: RE | Admit: 2020-04-27 | Discharge: 2020-04-27 | Disposition: A | Discharge status: Home / Self Care | Payer: Medicare Other | Source: Ambulatory Visit | Attending: Hematology and Oncology | Admitting: Hematology and Oncology

## 2020-04-27 ENCOUNTER — Inpatient Hospital Stay (HOSPITAL_BASED_OUTPATIENT_CLINIC_OR_DEPARTMENT_OTHER): Payer: Medicare Other | Admitting: Hematology and Oncology

## 2020-04-27 ENCOUNTER — Encounter: Payer: Self-pay | Admitting: Internal Medicine

## 2020-04-27 ENCOUNTER — Other Ambulatory Visit: Payer: Self-pay

## 2020-04-27 ENCOUNTER — Inpatient Hospital Stay: Admit: 2020-04-27 | Payer: Medicare Other | Admitting: Internal Medicine

## 2020-04-27 ENCOUNTER — Inpatient Hospital Stay: Payer: Medicare Other

## 2020-04-27 VITALS — BP 129/63 | HR 80 | Temp 95.8°F | Resp 16 | Ht 66.0 in | Wt 119.8 lb

## 2020-04-27 DIAGNOSIS — C8208 Follicular lymphoma grade I, lymph nodes of multiple sites: Secondary | ICD-10-CM | POA: Diagnosis not present

## 2020-04-27 DIAGNOSIS — H9193 Unspecified hearing loss, bilateral: Secondary | ICD-10-CM | POA: Diagnosis present

## 2020-04-27 DIAGNOSIS — Z515 Encounter for palliative care: Secondary | ICD-10-CM

## 2020-04-27 DIAGNOSIS — Z9221 Personal history of antineoplastic chemotherapy: Secondary | ICD-10-CM

## 2020-04-27 DIAGNOSIS — Z936 Other artificial openings of urinary tract status: Secondary | ICD-10-CM

## 2020-04-27 DIAGNOSIS — E871 Hypo-osmolality and hyponatremia: Secondary | ICD-10-CM | POA: Diagnosis present

## 2020-04-27 DIAGNOSIS — Z7952 Long term (current) use of systemic steroids: Secondary | ICD-10-CM

## 2020-04-27 DIAGNOSIS — Z8673 Personal history of transient ischemic attack (TIA), and cerebral infarction without residual deficits: Secondary | ICD-10-CM | POA: Diagnosis not present

## 2020-04-27 DIAGNOSIS — Z79899 Other long term (current) drug therapy: Secondary | ICD-10-CM | POA: Diagnosis not present

## 2020-04-27 DIAGNOSIS — Z923 Personal history of irradiation: Secondary | ICD-10-CM

## 2020-04-27 DIAGNOSIS — C8290 Follicular lymphoma, unspecified, unspecified site: Secondary | ICD-10-CM | POA: Diagnosis present

## 2020-04-27 DIAGNOSIS — M810 Age-related osteoporosis without current pathological fracture: Secondary | ICD-10-CM

## 2020-04-27 DIAGNOSIS — Z800 Family history of malignant neoplasm of digestive organs: Secondary | ICD-10-CM | POA: Diagnosis not present

## 2020-04-27 DIAGNOSIS — Z7189 Other specified counseling: Secondary | ICD-10-CM

## 2020-04-27 DIAGNOSIS — Z888 Allergy status to other drugs, medicaments and biological substances status: Secondary | ICD-10-CM

## 2020-04-27 DIAGNOSIS — K219 Gastro-esophageal reflux disease without esophagitis: Secondary | ICD-10-CM | POA: Diagnosis present

## 2020-04-27 DIAGNOSIS — R1310 Dysphagia, unspecified: Secondary | ICD-10-CM

## 2020-04-27 DIAGNOSIS — R627 Adult failure to thrive: Principal | ICD-10-CM | POA: Diagnosis present

## 2020-04-27 DIAGNOSIS — N1330 Unspecified hydronephrosis: Secondary | ICD-10-CM | POA: Diagnosis present

## 2020-04-27 DIAGNOSIS — R600 Localized edema: Secondary | ICD-10-CM

## 2020-04-27 DIAGNOSIS — Z801 Family history of malignant neoplasm of trachea, bronchus and lung: Secondary | ICD-10-CM

## 2020-04-27 DIAGNOSIS — G893 Neoplasm related pain (acute) (chronic): Secondary | ICD-10-CM | POA: Diagnosis present

## 2020-04-27 DIAGNOSIS — R590 Localized enlarged lymph nodes: Secondary | ICD-10-CM | POA: Diagnosis present

## 2020-04-27 DIAGNOSIS — Z20822 Contact with and (suspected) exposure to covid-19: Secondary | ICD-10-CM | POA: Diagnosis present

## 2020-04-27 DIAGNOSIS — Z66 Do not resuscitate: Secondary | ICD-10-CM | POA: Diagnosis not present

## 2020-04-27 DIAGNOSIS — C8590 Non-Hodgkin lymphoma, unspecified, unspecified site: Secondary | ICD-10-CM | POA: Diagnosis present

## 2020-04-27 DIAGNOSIS — C8220 Follicular lymphoma grade III, unspecified, unspecified site: Secondary | ICD-10-CM | POA: Diagnosis present

## 2020-04-27 DIAGNOSIS — C8288 Other types of follicular lymphoma, lymph nodes of multiple sites: Secondary | ICD-10-CM | POA: Diagnosis not present

## 2020-04-27 LAB — MAGNESIUM: Magnesium: 2 mg/dL (ref 1.7–2.4)

## 2020-04-27 LAB — CBC WITH DIFFERENTIAL/PLATELET
Abs Immature Granulocytes: 0.11 10*3/uL — ABNORMAL HIGH (ref 0.00–0.07)
Basophils Absolute: 0 10*3/uL (ref 0.0–0.1)
Basophils Relative: 0 %
Eosinophils Absolute: 0 10*3/uL (ref 0.0–0.5)
Eosinophils Relative: 0 %
HCT: 28.5 % — ABNORMAL LOW (ref 36.0–46.0)
Hemoglobin: 9.7 g/dL — ABNORMAL LOW (ref 12.0–15.0)
Immature Granulocytes: 1 %
Lymphocytes Relative: 1 %
Lymphs Abs: 0.1 10*3/uL — ABNORMAL LOW (ref 0.7–4.0)
MCH: 32.1 pg (ref 26.0–34.0)
MCHC: 34 g/dL (ref 30.0–36.0)
MCV: 94.4 fL (ref 80.0–100.0)
Monocytes Absolute: 0.8 10*3/uL (ref 0.1–1.0)
Monocytes Relative: 9 %
Neutro Abs: 8 10*3/uL — ABNORMAL HIGH (ref 1.7–7.7)
Neutrophils Relative %: 89 %
Platelets: 186 10*3/uL (ref 150–400)
RBC: 3.02 MIL/uL — ABNORMAL LOW (ref 3.87–5.11)
RDW: 13.2 % (ref 11.5–15.5)
WBC: 9 10*3/uL (ref 4.0–10.5)
nRBC: 0 % (ref 0.0–0.2)

## 2020-04-27 LAB — COMPREHENSIVE METABOLIC PANEL
ALT: 21 U/L (ref 0–44)
AST: 20 U/L (ref 15–41)
Albumin: 3.1 g/dL — ABNORMAL LOW (ref 3.5–5.0)
Alkaline Phosphatase: 118 U/L (ref 38–126)
Anion gap: 8 (ref 5–15)
BUN: 32 mg/dL — ABNORMAL HIGH (ref 8–23)
CO2: 24 mmol/L (ref 22–32)
Calcium: 8 mg/dL — ABNORMAL LOW (ref 8.9–10.3)
Chloride: 92 mmol/L — ABNORMAL LOW (ref 98–111)
Creatinine, Ser: 0.83 mg/dL (ref 0.44–1.00)
GFR calc Af Amer: >60 mL/min (ref >60)
GFR calc non Af Amer: >60 mL/min (ref >60)
Glucose, Bld: 119 mg/dL — ABNORMAL HIGH (ref 70–99)
Potassium: 3.4 mmol/L — ABNORMAL LOW (ref 3.5–5.1)
Sodium: 124 mmol/L — ABNORMAL LOW (ref 135–145)
Total Bilirubin: 0.8 mg/dL (ref 0.3–1.2)
Total Protein: 5.4 g/dL — ABNORMAL LOW (ref 6.5–8.1)

## 2020-04-27 LAB — PHOSPHORUS: Phosphorus: 2.5 mg/dL (ref 2.5–4.6)

## 2020-04-27 LAB — SARS CORONAVIRUS 2 BY RT PCR (HOSPITAL ORDER, PERFORMED IN ~~LOC~~ HOSPITAL LAB): SARS Coronavirus 2: NEGATIVE

## 2020-04-27 MED ORDER — BISACODYL 5 MG PO TBEC: 5.0000 mg | DELAYED_RELEASE_TABLET | Freq: Every day | ORAL | Status: DC | PRN

## 2020-04-27 MED ORDER — ACYCLOVIR 200 MG PO CAPS
400.0000 mg | ORAL_CAPSULE | Freq: Every day | ORAL | Status: DC
  Filled 2020-04-27 (×5): qty 2

## 2020-04-27 MED ORDER — PANTOPRAZOLE SODIUM 40 MG PO TBEC
40.0000 mg | DELAYED_RELEASE_TABLET | Freq: Every day | ORAL | Status: DC
  Administered 2020-04-27: 40 mg via ORAL
  Filled 2020-04-27 (×2): qty 1

## 2020-04-27 MED ORDER — SODIUM CHLORIDE 0.9 % IV SOLN
Freq: Once | INTRAVENOUS | Status: AC
  Filled 2020-04-27: qty 250

## 2020-04-27 MED ORDER — ACYCLOVIR 400 MG PO TABS
400.0000 mg | ORAL_TABLET | Freq: Every day | ORAL | Status: DC
  Filled 2020-04-27: qty 1

## 2020-04-27 MED ORDER — SODIUM CHLORIDE 0.9% FLUSH: 3.0000 mL | Freq: Two times a day (BID) | INTRAVENOUS | Status: DC

## 2020-04-27 MED ORDER — LENALIDOMIDE 5 MG PO CAPS
5.0000 mg | ORAL_CAPSULE | Freq: Every day | ORAL | Status: DC
  Administered 2020-04-27 – 2020-04-30 (×4): 5 mg via ORAL
  Filled 2020-04-27 (×4): qty 1

## 2020-04-27 MED ORDER — KCL IN DEXTROSE-NACL 40-5-0.45 MEQ/L-%-% IV SOLN
INTRAVENOUS | Status: DC
  Filled 2020-04-27 (×6): qty 1000

## 2020-04-27 MED ORDER — SODIUM CHLORIDE 0.9 % IV SOLN: 250.0000 mL | INTRAVENOUS | Status: DC | PRN

## 2020-04-27 MED ORDER — ENOXAPARIN SODIUM 40 MG/0.4ML ~~LOC~~ SOLN
40.0000 mg | SUBCUTANEOUS | Status: DC
  Filled 2020-04-27: qty 0.4

## 2020-04-27 MED ORDER — MORPHINE SULFATE ER 15 MG PO TBCR
15.0000 mg | EXTENDED_RELEASE_TABLET | Freq: Two times a day (BID) | ORAL | Status: DC
  Administered 2020-04-27 – 2020-05-01 (×8): 15 mg via ORAL
  Filled 2020-04-27 (×8): qty 1

## 2020-04-27 MED ORDER — PROMETHAZINE HCL 25 MG PO TABS
12.5000 mg | ORAL_TABLET | Freq: Four times a day (QID) | ORAL | Status: DC | PRN
  Filled 2020-04-27: qty 1

## 2020-04-27 MED ORDER — SODIUM CHLORIDE 0.9% FLUSH: 3.0000 mL | INTRAVENOUS | Status: DC | PRN

## 2020-04-27 MED ORDER — ACETAMINOPHEN 650 MG RE SUPP: 650.0000 mg | Freq: Four times a day (QID) | RECTAL | Status: DC | PRN

## 2020-04-27 MED ORDER — ACETAMINOPHEN 325 MG PO TABS
650.0000 mg | ORAL_TABLET | Freq: Four times a day (QID) | ORAL | Status: DC | PRN
  Administered 2020-04-30 – 2020-05-01 (×4): 650 mg via ORAL
  Filled 2020-04-27 (×4): qty 2

## 2020-04-27 MED ORDER — POLYETHYLENE GLYCOL 3350 17 G PO PACK
17.0000 g | PACK | Freq: Every day | ORAL | Status: DC
  Administered 2020-04-27 – 2020-04-29 (×2): 17 g via ORAL
  Filled 2020-04-27 (×2): qty 1

## 2020-04-27 MED ORDER — ALLOPURINOL 100 MG PO TABS
100.0000 mg | ORAL_TABLET | Freq: Every day | ORAL | Status: DC
  Filled 2020-04-27: qty 1

## 2020-04-27 MED ORDER — PROBIOTIC DAILY PO CAPS: ORAL_CAPSULE | Freq: Every day | ORAL | Status: DC

## 2020-04-27 MED ORDER — RISAQUAD PO CAPS
1.0000 | ORAL_CAPSULE | Freq: Every day | ORAL | Status: DC
  Filled 2020-04-27: qty 1

## 2020-04-27 MED ORDER — OXYCODONE HCL 5 MG PO TABS
5.0000 mg | ORAL_TABLET | Freq: Four times a day (QID) | ORAL | Status: DC | PRN
  Administered 2020-04-27 – 2020-05-01 (×6): 5 mg via ORAL
  Filled 2020-04-27 (×6): qty 1

## 2020-04-27 NOTE — H&P (Signed)
[facility] History and Physical   Patient: [name] [id number]   PCP: Marinda Elk, MD DOB: 1932/10/16   DOA: 04/27/2020   DOS: 04/27/2020   DOS: the patient was seen and examined on 04/27/2020   Patient coming from: The patient is coming from Home  Chief Complaint: Dysphagia, fatigue and generalized weakness   HPI: [name] is a 86 y.o. female with Past medical history of stage IIIA follicular non-Hodgkin's lymphoma and right sided hydronephrosis s/p external stent, chronic pain secondary to cancer, chronic hyponatremia, chronic bilateral lower extremity edema, presented due to becoming weak and fatigued, decreased oral intake, patient is having dysphagia unable to eat and hardly drinking water.  Patient is able to swallow pills as per her husband.  Patient has chronic pain secondary to cancer, lower extremity edema is getting worse.  Patient feels that her everything is falling apart and she is going downhill.  Patient was seen by oncologist and sent for direct admission for further work-up and management.   Review of Systems: as mentioned in the history of present illness.  All other systems reviewed and are negative.  Past Medical History:  Diagnosis Date  . Arthritis    osteoporosis also. prolia treatments every 6 months  . Baker cyst, right 03/2020  . Cancer [facility]) 2013   Non-Hodgkin Lymphoma with chemo and rad tx  . Complication of anesthesia   . Dehydration    low sodium levels  . Dysrhythmia   . Essential tremor   . Follicular lymphoma grade I ([place]) 08/06/2002  . GERD (gastroesophageal reflux disease)   . Hearing loss of both ears    wears bilateral hearing aides  . 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 History of blood transfusion   . History of chemotherapy   . History of radiation therapy   . Hypertension   . Labile blood pressure   . Meniere's disease    with bilateral hearing loss  . Mini stroke  [facility])    summer 2014  . Osteoporosis   . PONV (postoperative nausea and vomiting)   . Renal artery aneurysm ([place])   . Stroke [facility]) 2015   possible mini stroke. loss of conciousness while sitting at table. could not diagnose  . Syncope    Past Surgical History:  Procedure Laterality Date  . APPENDECTOMY    . BONE MARROW BIOPSY  08/09/2002  . BREAST LUMPECTOMY  1950s   benign  . CATARACT EXTRACTION  03/2009  . COLONOSCOPY    . CYSTOSCOPY WITH STENT PLACEMENT Right 03/29/2020   Procedure: CYSTOSCOPY WITH STENT PLACEMENT;  Surgeon: Hollice Espy, MD;  Location: [facility] ORS;  Service: Urology;  Laterality: Right;  . ESOPHAGOGASTRODUODENOSCOPY (EGD) WITH PROPOFOL N/A 09/23/2019   Procedure: ESOPHAGOGASTRODUODENOSCOPY (EGD) WITH PROPOFOL;  Surgeon: Jonathon Bellows, MD;  Location: [facility] ENDOSCOPY;  Service: Gastroenterology;  Laterality: N/A;  . FRACTURE SURGERY Left    wrist  . IR NEPHROSTOMY PLACEMENT RIGHT  04/13/2020  . LYMPH NODE BIOPSY  08/07/2002   cervical  . TUBAL LIGATION     Social History:  reports that she has never smoked. She has never used smokeless tobacco. She reports that she does not drink alcohol or use drugs.  Allergies  Allergen Reactions  . Lisinopril Cough  . Primidone Other (See Comments)    Makes her feel "loopy and unsteady" Also, did not help the tremors     Family history reviewed and not pertinent Family History  Problem Relation Age of Onset  . Cancer Father        Throat  . Cancer Sister 56       Colon     Prior to Admission medications   Medication Sig Start Date End Date Taking? Authorizing Provider  Acetaminophen (TYLENOL ARTHRITIS EXT RELIEF PO) Take 1 tablet by mouth daily as needed (pain).    [provider]  acyclovir (ZOVIRAX) 400 MG tablet Take 1 tablet (400 mg total) by mouth daily. 04/16/20   Lequita Asal, MD  allopurinol (ZYLOPRIM) 100 MG tablet Take 1 tablet (100 mg total) by mouth daily. 04/20/20   Lequita Asal, MD    AZELASTINE & FLUTICASONE NA 1 spray each nase twice daily.    [provider]  calcium-vitamin D (CALCIUM 500/D) 500-200 MG-UNIT tablet Take 1 tablet by mouth 2 (two) times daily.     [provider]  denosumab (PROLIA) 60 MG/ML SOSY injection Inject 60 mg into the skin every 6 (six) months.     [provider]  DEXILANT 60 MG capsule TAKE ONE CAPSULE BY MOUTH ONE TIME DAILY  03/20/20   Jonathon Bellows, MD  Flax Oil-Fish Oil-Borage Oil (FISH OIL-FLAX OIL-BORAGE OIL) CAPS Take 1,000-1,400 mg by mouth daily.    [provider]  Glucosamine HCl-MSM (GLUCOSAMINE-MSM PO) Take 1 tablet by mouth daily.     [provider]  lenalidomide (REVLIMID) 5 MG capsule Take 1 capsule (5 mg total) by mouth daily. Take for 21 days, then hold for 7 days. Repeat every 28 days. 04/19/20   Lequita Asal, MD  Magnesium Citrate 125 MG CAPS Take 1 tablet by mouth daily.  12/05/19   [provider]  melatonin 5 MG TABS Take 5 mg by mouth at bedtime as needed.     [provider]  morphine (MS CONTIN) 15 MG 12 hr tablet Take 1 tablet (15 mg total) by mouth every 12 (twelve) hours. 04/23/20   Borders, Kirt Boys, NP  Multiple Vitamin (MULTIVITAMIN) tablet Take 1 tablet by mouth daily.    [provider]  Multiple Vitamins-Minerals (PRESERVISION AREDS 2 PO) Take 1 tablet by mouth daily.     [provider]  naproxen sodium (ALEVE) 220 MG tablet Take 220 mg by mouth daily as needed.     [provider]  ondansetron (ZOFRAN) 8 MG tablet Take 1 tablet (8 mg total) by mouth 2 (two) times daily. Start second day after chemotherapy. Then take as needed for nausea or vomiting. Patient not taking: Reported on 04/20/2020 04/16/20   Lequita Asal, MD  oxybutynin (DITROPAN) 5 MG tablet Take 1 tablet (5 mg total) by mouth every 8 (eight) hours as needed for bladder spasms. Patient not taking: Reported on 04/18/2020 03/29/20   Hollice Espy, MD  oxyCODONE  (OXY IR/ROXICODONE) 5 MG immediate release tablet Take 1/2 or 1 pill by mouth every 6 hours as needed for pain. 04/20/20   Lequita Asal, MD  polyethylene glycol (MIRALAX / GLYCOLAX) 17 g packet Take 17 g by mouth daily.    [provider]  predniSONE (DELTASONE) 10 MG tablet Take 3 tablets (30 mg total) by mouth daily with breakfast. 04/03/20   Lequita Asal, MD  Probiotic Product (PROBIOTIC DAILY PO) Take 2 tablets by mouth daily.     [provider]  tamsulosin (FLOMAX) 0.4 MG CAPS capsule Take 1 capsule (0.4 mg total) by mouth daily. Patient not taking: Reported on 04/18/2020 03/29/20  Hollice Espy, MD  triamcinolone cream (KENALOG) 0.1 % Apply 1 application topically as needed.     [provider]  Turmeric Curcumin 500 MG CAPS Take 1 capsule by mouth daily.     [provider]    Physical Exam: Vitals:   04/27/20 1656  BP: 126/72  Pulse: 82  Temp: 99.3 F (37.4 C)  TempSrc: Oral  SpO2: 100%    General: alert and oriented to time, place, and person. Appear in mild distress, affect appropriate Eyes: PERRLA, Conjunctiva normal ENT: Oral Mucosa Clear, dry  Neck: no JVD, no Abnormal Mass Or lumps Cardiovascular: S1 and S2 Present, no Murmur, peripheral pulses symmetrical Respiratory: good respiratory effort, Bilateral Air entry equal and Decreased, no signs of accessory muscle use, Clear to Auscultation, no Crackles, no wheezes Abdomen: Bowel Sound present, Soft and mild tenderness right side, no hernia Skin: no rashes  Extremities: 2-3+ Pedal edema, chronic skin changes due to venous stasis, pigmentation, no signs of infection Neurologic: without any new focal findings Gait not checked due to patient safety concerns  Data Reviewed: I have personally reviewed and interpreted labs, imaging as discussed below.  CBC: Recent Labs  Lab 04/23/20 0802 04/27/20 1115  WBC 7.4 9.0  NEUTROABS 6.4 8.0*  HGB 10.4* 9.7*  HCT 30.3* 28.5*    MCV 92.7 94.4  PLT 210 973   Basic Metabolic Panel: Recent Labs  Lab 04/23/20 0802 04/27/20 1115  NA 126* 124*  K 3.8 3.4*  CL 91* 92*  CO2 25 24  GLUCOSE 162* 119*  BUN 24* 32*  CREATININE 1.02* 0.83  CALCIUM 8.3* 8.0*  MG  --  2.0   GFR: Estimated Creatinine Clearance: 42.6 mL/min (by C-G formula based on SCr of 0.83 mg/dL). Liver Function Tests: Recent Labs  Lab 04/23/20 0802 04/27/20 1115  AST 23 20  ALT 21 21  ALKPHOS 135* 118  BILITOT 0.7 0.8  PROT 5.8* 5.4*  ALBUMIN 3.3* 3.1*   No results for input(s): LIPASE, AMYLASE in the last 168 hours. No results for input(s): AMMONIA in the last 168 hours. Coagulation Profile: No results for input(s): INR, PROTIME in the last 168 hours. Cardiac Enzymes: No results for input(s): CKTOTAL, CKMB, CKMBINDEX, TROPONINI in the last 168 hours. BNP (last 3 results) No results for input(s): PROBNP in the last 8760 hours. HbA1C: No results for input(s): HGBA1C in the last 72 hours. CBG: No results for input(s): GLUCAP in the last 168 hours. Lipid Profile: No results for input(s): CHOL, HDL, LDLCALC, TRIG, CHOLHDL, LDLDIRECT in the last 72 hours. Thyroid Function Tests: No results for input(s): TSH, T4TOTAL, FREET4, T3FREE, THYROIDAB in the last 72 hours. Anemia Panel: No results for input(s): VITAMINB12, FOLATE, FERRITIN, TIBC, IRON, RETICCTPCT in the last 72 hours. Urine analysis:    Component Value Date/Time   COLORURINE YELLOW (A) 04/23/2020 1003   APPEARANCEUR HAZY (A) 04/23/2020 1003   APPEARANCEUR Hazy (A) 04/18/2020 1320   LABSPEC 1.006 04/23/2020 1003   LABSPEC 1.008 11/26/2013 0954   PHURINE 6.0 04/23/2020 1003   GLUCOSEU NEGATIVE 04/23/2020 1003   GLUCOSEU Negative 11/26/2013 0954   HGBUR NEGATIVE 04/23/2020 1003   BILIRUBINUR NEGATIVE 04/23/2020 1003   BILIRUBINUR Negative 04/18/2020 1320   BILIRUBINUR Negative 11/26/2013 0954   KETONESUR 5 (A) 04/23/2020 1003   PROTEINUR NEGATIVE 04/23/2020 1003    NITRITE NEGATIVE 04/23/2020 1003   LEUKOCYTESUR SMALL (A) 04/23/2020 1003   LEUKOCYTESUR Negative 11/26/2013 0954    Radiological Exams on Admission: US Venous  Img Lower Bilateral (DVT)  Result Date: 04/27/2020 CLINICAL DATA:  Persistent lower extremity swelling over the last 2 months. EXAM: Right LOWER EXTREMITY VENOUS DOPPLER ULTRASOUND TECHNIQUE: Gray-scale sonography with compression, as well as color and duplex ultrasound, were performed to evaluate the deep venous system(s) from the level of the common femoral vein through the popliteal and proximal calf veins. COMPARISON:  None. FINDINGS: VENOUS Normal compressibility of the common femoral, superficial femoral, and popliteal veins, as well as the visualized calf veins. Visualized portions of profunda femoral vein and great saphenous vein unremarkable. No filling defects to suggest DVT on grayscale or color Doppler imaging. Doppler waveforms show normal direction of venous flow, normal respiratory plasticity and response to augmentation. Limited views of the contralateral common femoral vein are unremarkable. OTHER Right Baker cyst measuring up to 4.5 cm. Limitations: none IMPRESSION: No evidence of deep venous thrombosis. Electronically Signed   By: Nelson Chimes M.D.   On: 04/27/2020 15:27    I reviewed all nursing notes, pharmacy notes, vitals, pertinent old records.  Assessment/Plan  Active Problems:   Follicular lymphoma (HCC)   Non-Hodgkin's lymphoma (HCC)   Hydronephrosis of right kidney   Dysphagia    Dysphagia, failure to thrive and generalized weakness Follow SLP eval, started on pured diet for now  Continue aspiration precaution Continue PPI Continue IV fluid D5 NS w/K for hydration GI consult please call in a.m. for possible EGD Keep n.p.o. after midnight  Stage IIIAfollicular non-Hodgkin's lymphoma She is currently day 12 of cycle #1 obinutuzumab and Revlimid. Revlimid 5 mg po q day on days 1-21 of cycles  1-6. Obinutuzumab 1000 mg IV on days 1, 8, 15 of cycle #1 then 1000 mg on day 1 of cycles #2-6. She has tolerated her infusions of obinutuzumab well. She continues daily Revlimid. Continue prophylactic medications and dose adjust for renal insufficiency. Allopurinol 100 mg a day. Acyclovir 400 mg a day (possible increase to BID if renal function improved). Consult oncology please call in a.m.   Right hydroureteronephrosis Adenopathy caused obstruction.Patient is s/p internal then external nephrostomy tube placement.  Renal function stable, continue to monitor.  Right lower extremity edema Etiology secondary to obstructive adenopathy in abdomen/pelvis limiting venous return. No signs of infection Duplex on 04/10/2020 revealed no evidence of DVT. VQ scan on 03/21/2020 revealed no evidence of pulmonary embolism. Begin baby aspirin (81 mg/day) or low dose Eliquis (2.5 mg po BID) secondary to slow flow and risk of thrombosis.  Consider to start after GI work-up patient may need EGD Continue Lovenox in the meantime Continue wound care  Cancer-related pain Continue MS Contin and oxycodone IR as needed Continue Tylenol as needed  Hyponatremia, secondary to poor oral intake. Continue IV fluid D5 NS with K Monitor sodium level daily              Anemia of chronic disease, hemoglobin 9.7 Continue to monitor H&H.   Nutrition: Pured diet until seen by SLP DVT Prophylaxis: Subcutaneous Lovenox  Advance goals of care discussion: DNR   Consults: GI please call in a.m.   Family Communication: pt's husband was present at bedside, at the time of interview.  Opportunity was given to ask question and all questions were answered satisfactorily.  Disposition: Admitted as inpatient, on med-surge unit. Likely to be discharged home w/HH/PT if her condition improves, in 2 to 3 days  I have discussed plan of care as described above with RN and patient/family.  Severity of Illness: The  appropriate patient  status for this patient is INPATIENT. Inpatient status is judged to be reasonable and necessary in order to provide the required intensity of service to ensure the patient's safety. The patient's presenting symptoms, physical exam findings, and initial radiographic and laboratory data in the context of their chronic comorbidities is felt to place them at high risk for further clinical deterioration. Furthermore, it is not anticipated that the patient will be medically stable for discharge from the hospital within 2 midnights of admission. The following factors support the patient status of inpatient.   " The patient's presenting symptoms include dysphagia. " The worrisome physical exam findings include very cachectic. " The initial radiographic and laboratory data are worrisome because of nutritional deficiency, hyponatremia. " The chronic co-morbidities include non-Hodgkin lymphoma.   * I certify that at the point of admission it is my clinical judgment that the patient will require inpatient hospital care spanning beyond 2 midnights from the point of admission due to high intensity of service, high risk for further deterioration and high frequency of surveillance required.*    Author: Val Riles, MD [facility] 04/27/2020 5:39 PM   To reach On-call, see care teams to locate the attending and reach out to them via [email or web address]. If 7PM-7AM, please contact night-coverage If you still have difficulty reaching the attending provider, please page the [facility] (Director on Call) for [facility] on amion for assistance.

## 2020-04-27 NOTE — Progress Notes (Signed)
The patient c/o not able to swallow or cough. The patient very sick to her stomach. Not eatting or drinking well only a drink 20-36 oz a day. The patient states she is going down hill fast. The patient is very weak, nausea, fatigue, change in color of her skin very pale, very shaky today. Her husband reports the patient legs are still draining and she is showing signs of depression. The patient has seen [facility] and she was given Morphine and she has been sleeping all day. Her husband states he will get her up and walk around at times to get her up and going.

## 2020-04-27 NOTE — Progress Notes (Signed)
IV converted to saline lock, fluids on hold until after ultra sound, pt taken via w/c to radiology  1545 pt returned to infusion via w.c, awaiting [place] results and admission status. 41 pt transported to [facility] hospital via car (husband) for admission. #22 IV cath left in left wrist as saline lock, securely wrapped. Pt and husband discussing what type further tx plans if any they are interested in having.

## 2020-04-28 DIAGNOSIS — Z7189 Other specified counseling: Secondary | ICD-10-CM

## 2020-04-28 DIAGNOSIS — N1330 Unspecified hydronephrosis: Secondary | ICD-10-CM

## 2020-04-28 DIAGNOSIS — E871 Hypo-osmolality and hyponatremia: Secondary | ICD-10-CM | POA: Diagnosis present

## 2020-04-28 LAB — CBC WITH DIFFERENTIAL/PLATELET
Abs Immature Granulocytes: 0.07 10*3/uL (ref 0.00–0.07)
Basophils Absolute: 0 10*3/uL (ref 0.0–0.1)
Basophils Relative: 0 %
Eosinophils Absolute: 0.2 10*3/uL (ref 0.0–0.5)
Eosinophils Relative: 2 %
HCT: 27.2 % — ABNORMAL LOW (ref 36.0–46.0)
Hemoglobin: 9.2 g/dL — ABNORMAL LOW (ref 12.0–15.0)
Immature Granulocytes: 1 %
Lymphocytes Relative: 2 %
Lymphs Abs: 0.2 10*3/uL — ABNORMAL LOW (ref 0.7–4.0)
MCH: 31.9 pg (ref 26.0–34.0)
MCHC: 33.8 g/dL (ref 30.0–36.0)
MCV: 94.4 fL (ref 80.0–100.0)
Monocytes Absolute: 1.3 10*3/uL — ABNORMAL HIGH (ref 0.1–1.0)
Monocytes Relative: 19 %
Neutro Abs: 5.5 10*3/uL (ref 1.7–7.7)
Neutrophils Relative %: 76 %
Platelets: 200 10*3/uL (ref 150–400)
RBC: 2.88 MIL/uL — ABNORMAL LOW (ref 3.87–5.11)
RDW: 13 % (ref 11.5–15.5)
WBC: 7.3 10*3/uL (ref 4.0–10.5)
nRBC: 0 % (ref 0.0–0.2)

## 2020-04-28 LAB — BASIC METABOLIC PANEL
Anion gap: 6 (ref 5–15)
BUN: 23 mg/dL (ref 8–23)
CO2: 25 mmol/L (ref 22–32)
Calcium: 7.5 mg/dL — ABNORMAL LOW (ref 8.9–10.3)
Chloride: 98 mmol/L (ref 98–111)
Creatinine, Ser: 0.78 mg/dL (ref 0.44–1.00)
GFR calc Af Amer: >60 mL/min (ref >60)
GFR calc non Af Amer: >60 mL/min (ref >60)
Glucose, Bld: 100 mg/dL — ABNORMAL HIGH (ref 70–99)
Potassium: 3.8 mmol/L (ref 3.5–5.1)
Sodium: 129 mmol/L — ABNORMAL LOW (ref 135–145)

## 2020-04-28 LAB — PHOSPHORUS: Phosphorus: 2.3 mg/dL — ABNORMAL LOW (ref 2.5–4.6)

## 2020-04-28 LAB — MAGNESIUM: Magnesium: 2.1 mg/dL (ref 1.7–2.4)

## 2020-04-28 MED ORDER — SODIUM CHLORIDE 0.9 % IV SOLN: INTRAVENOUS | Status: DC

## 2020-04-28 MED ORDER — BISACODYL 5 MG PO TBEC
5.0000 mg | DELAYED_RELEASE_TABLET | Freq: Every day | ORAL | Status: DC
  Administered 2020-04-29: 10:00:00 5 mg via ORAL
  Filled 2020-04-28: qty 1

## 2020-04-28 MED ORDER — MORPHINE SULFATE (PF) 2 MG/ML IV SOLN
1.0000 mg | INTRAVENOUS | Status: DC | PRN
  Administered 2020-04-28 – 2020-05-01 (×4): 1 mg via INTRAVENOUS
  Filled 2020-04-28 (×4): qty 1

## 2020-04-28 NOTE — Progress Notes (Signed)
SLP Cancellation Note  Patient Details Name: DALINA KREITER MRN: [id number] DOB: [birth year]   Cancelled treatment:       Reason Eval/Treat Not Completed: Patient at procedure or test/unavailable(chart reviewed; consulted NSG). Per chart notes, pt has had a decline in oral intake unable to manage swallowing foods; minimal amounts of water. She has been swallowing Pills per Husband. SHe is currently NPO today for possible EGD per chart notes.  Recommend follow GI recommendation for food consistency post EGD/potential Dilation but would recommend cut, moist foods(ie. mech soft) for conservation of energy; general aspiration precautions. Recommend Pills in Puree cut smaller as needed. REcommend f/u w/ Dietician for Nutritional support d/t decreased oral intake, medical issues.  NSG updated and agreed.     Orinda Kenner, [place], CCC-SLP Watson,Katherine 04/28/2020, 10:15 AM

## 2020-04-28 NOTE — Progress Notes (Signed)
[facility] Hematology/Oncology Progress Note  Date of admission: 04/27/2020  Hospital day:  04/28/2020  Chief Complaint: [name] is a 86 y.o. female with stage IIIA follicular non-Hodgkin's lymphoma and right sided hydronephrosis s/p external stent placement who was admitted on day 12 s/p cycle #1 obintutuzumab and Revlimid for failure to thrive.  Subjective: She notes eating a few bites last night.  Right sided abdominal pain better controlled with IV pain medication.  Social History: The patient is accompanied by her husband today.  Allergies:  Allergies  Allergen Reactions  . Lisinopril Cough  . Primidone Other (See Comments)    Makes her feel "loopy and unsteady" Also, did not help the tremors    Scheduled Medications: . acidophilus  1 capsule Oral Daily  . acyclovir  400 mg Oral Daily  . allopurinol  100 mg Oral Daily  . bisacodyl  5 mg Oral Daily  . enoxaparin (LOVENOX) injection  40 mg Subcutaneous Q24H  . lenalidomide  5 mg Oral Daily  . morphine  15 mg Oral Q12H  . pantoprazole  40 mg Oral Daily  . polyethylene glycol  17 g Oral Daily  . sodium chloride flush  3 mL Intravenous Q12H  . sodium chloride flush  3 mL Intravenous Q12H    Review of Systems: GENERAL:  Feels tired.  No fevers, sweats or weight loss. PERFORMANCE STATUS (ECOG):  2-3 HEENT:  Thick secretions.  No visual changes, runny nose, sore throat, mouth sores or tenderness. Lungs: No shortness of breath.  Slight cough.  No hemoptysis. Cardiac:  No chest pain, palpitations, orthopnea, or PND. GI:  Dysphagia.  Right sided abdominal pain.  No nausea, vomiting, diarrhea, constipation, melena or hematochezia. GU:  Right external nephrostomy tube in place.  No urgency, frequency, dysuria, or hematuria. Musculoskeletal:  No back pain.  No joint pain.  Extremities:  Lower extremity edema (chronic).   Legs tender. Skin:  Right lower leg with weeping. Neuro:  General fatigue.  No  headache, numbness or weakness, balance or coordination issues. Endocrine:  No diabetes, thyroid issues, hot flashes or night sweats. Psych:  Tired of being sick.  No anxiety. Pain:  Right sided abdominal pain (improved). Review of systems:  All other systems reviewed and found to be negative.  Physical Exam: Blood pressure (!) 145/66, pulse 70, temperature 98.3 F (36.8 C), resp. rate 14, height 5\' 6"  (1.676 m), weight 119 lb 13.1 oz (54.3 kg), SpO2 97 %.  GENERAL:  Thin elderly woman sitting comfortably on the medical unit in no acute distress. MENTAL STATUS:  Alert and oriented to person, place and time. HEAD:  Short gray hair.  Normocephalic, atraumatic, face symmetric, no Cushingoid features. EYES:  Blue eyes.  Pupils equal round and reactive to light and accomodation.  No conjunctivitis or scleral icterus. RESPIRATORY:  Clear to auscultation anteriorly without rales, wheezes or rhonchi. CARDIOVASCULAR:  Regular rate and rhythm without murmur, rub or gallop. ABDOMEN:  Soft, non-tender, with active bowel sounds, and no hepatosplenomegaly.  No guarding or rebound tenderness.  No masses. SKIN:  No rashes. EXTREMITIES: Bilateral lower extremity pitting edema (right > left).  No palpable cords. LYMPH NODES: Fingertip left supraclavicular node.  No axillary or inguinal adenopathy . NEUROLOGICAL: Unremarkable. PSYCH:  Appropriate.   Results for orders placed or performed during the hospital encounter of 04/27/20 (from the past 48 hour(s))  SARS Coronavirus 2 by RT PCR (hospital order, performed in [facility] hospital lab) Nasopharyngeal Nasopharyngeal Swab  Status: None   Collection Time: 04/27/20  5:11 PM   Specimen: Nasopharyngeal Swab  Result Value Ref Range   SARS Coronavirus 2 NEGATIVE NEGATIVE    Comment: (NOTE) SARS-CoV-2 target nucleic acids are NOT DETECTED. The SARS-CoV-2 RNA is generally detectable in upper and lower respiratory specimens during the acute phase of  infection. The lowest concentration of SARS-CoV-2 viral copies this assay can detect is 250 copies / mL. A negative result does not preclude SARS-CoV-2 infection and should not be used as the sole basis for treatment or other patient management decisions.  A negative result may occur with improper specimen collection / handling, submission of specimen other than nasopharyngeal swab, presence of viral mutation(s) within the areas targeted by this assay, and inadequate number of viral copies (<250 copies / mL). A negative result must be combined with clinical observations, patient history, and epidemiological information. Fact Sheet for Patients:   StrictlyIdeas.no Fact Sheet for Healthcare Providers: BankingDealers.co.za This test is not yet approved or cleared  by the [place] FDA and has been authorized for detection and/or diagnosis of SARS-CoV-2 by FDA under an Emergency Use Authorization (EUA).  This EUA will remain in effect (meaning this test can be used) for the duration of the COVID-19 declaration under Section 564(b)(1) of the Act, 21 U.S.C. section 360bbb-3(b)(1), unless the authorization is terminated or revoked sooner. Performed at [facility], [address] [place], [place] [address]   Phosphorus     Status: None   Collection Time: 04/27/20  6:06 PM  Result Value Ref Range   Phosphorus 2.5 2.5 - 4.6 mg/dL    Comment: Performed at [facility], [address] [place], [place] [id number]  Basic metabolic panel     Status: Abnormal   Collection Time: 04/28/20  4:58 AM  Result Value Ref Range   Sodium 129 (L) 135 - 145 mmol/L   Potassium 3.8 3.5 - 5.1 mmol/L   Chloride 98 98 - 111 mmol/L   CO2 25 22 - 32 mmol/L   Glucose, Bld 100 (H) 70 - 99 mg/dL    Comment: Glucose reference range applies only to samples taken after fasting for at least 8 hours.   BUN 23 8 - 23 mg/dL   Creatinine, Ser 0.78 0.44  - 1.00 mg/dL   Calcium 7.5 (L) 8.9 - 10.3 mg/dL   GFR calc non Af Amer >60 >60 mL/min   GFR calc Af Amer >60 >60 mL/min   Anion gap 6 5 - 15    Comment: Performed at [facility], [address], [place], [place] [address]  Magnesium     Status: None   Collection Time: 04/28/20  4:58 AM  Result Value Ref Range   Magnesium 2.1 1.7 - 2.4 mg/dL    Comment: Performed at [facility], [address] [place], [place] [address]  Phosphorus     Status: Abnormal   Collection Time: 04/28/20  4:58 AM  Result Value Ref Range   Phosphorus 2.3 (L) 2.5 - 4.6 mg/dL    Comment: Performed at [facility], [address] [place], [place] [address]  CBC with Differential/Platelet     Status: Abnormal   Collection Time: 04/28/20  4:58 AM  Result Value Ref Range   WBC 7.3 4.0 - 10.5 K/uL   RBC 2.88 (L) 3.87 - 5.11 MIL/uL   Hemoglobin 9.2 (L) 12.0 - 15.0 g/dL   HCT 27.2 (L) 36.0 - 46.0 %   MCV  94.4 80.0 - 100.0 fL   MCH 31.9 26.0 - 34.0 pg   MCHC 33.8 30.0 - 36.0 g/dL   RDW 13.0 11.5 - 15.5 %   Platelets 200 150 - 400 K/uL   nRBC 0.0 0.0 - 0.2 %   Neutrophils Relative % 76 %   Neutro Abs 5.5 1.7 - 7.7 K/uL   Lymphocytes Relative 2 %   Lymphs Abs 0.2 (L) 0.7 - 4.0 K/uL   Monocytes Relative 19 %   Monocytes Absolute 1.3 (H) 0.1 - 1.0 K/uL   Eosinophils Relative 2 %   Eosinophils Absolute 0.2 0.0 - 0.5 K/uL   Basophils Relative 0 %   Basophils Absolute 0.0 0.0 - 0.1 K/uL   Immature Granulocytes 1 %   Abs Immature Granulocytes 0.07 0.00 - 0.07 K/uL    Comment: Performed at [facility], [address] [place], [place] [address]   US Venous Img Lower Bilateral (DVT)  Result Date: 04/27/2020 CLINICAL DATA:  Persistent lower extremity swelling over the last 2 months. EXAM: Right LOWER EXTREMITY VENOUS DOPPLER ULTRASOUND TECHNIQUE: Gray-scale sonography with compression, as well as color and duplex ultrasound, were performed to evaluate the deep venous  system(s) from the level of the common femoral vein through the popliteal and proximal calf veins. COMPARISON:  None. FINDINGS: VENOUS Normal compressibility of the common femoral, superficial femoral, and popliteal veins, as well as the visualized calf veins. Visualized portions of profunda femoral vein and great saphenous vein unremarkable. No filling defects to suggest DVT on grayscale or color Doppler imaging. Doppler waveforms show normal direction of venous flow, normal respiratory plasticity and response to augmentation. Limited views of the contralateral common femoral vein are unremarkable. OTHER Right Baker cyst measuring up to 4.5 cm. Limitations: none IMPRESSION: No evidence of deep venous thrombosis. Electronically Signed   By: Nelson Chimes M.D.   On: 04/27/2020 15:27    Assessment:  RENEZMEE CLIMER is a 86 y.o. female with stage IIIA follicular non-Hodgkin's lymphoma currently day 12 s/p cycle #1 obintutuzumab and Revlimid who was admitted with failure to thrive.  She has abdominal discomfort secondary to adenopathy.  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  Creatinine has returned to baseline.  She has bilateral lower extremity edema secondary to poor venous return from obstructive adenopathy.  She has hyponatremia secondary to poor fluid intake.  She has had poor caloric intake felt secondary to dysphagia from adenopathy near her esophagus.  Plan: 1. Stage III follicular non-Hodgkin's lymphoma  She is currently day 13 s/p cycle #1 obinutuzumab and revlimid.  She and her husband and I had a long talk to day about her wishes.  She does not wish to pursue further treatment for her lymphoma.  She is interested in palliative care and Hospice.  Her main focus is pain control. 2. Cancer-related pain  She has been on MS Contin 15 mg po q 12 hours, but by her report makes her sleepy.  She states that the  current IV pain medication (morphine 1 mg IV q 3 hours) works well.  3. Right sided hydronephrosis  Renal function has improved to baseline.  External stent is not causing any discomfort. 4. Bilateral lower extremity edema   Etiology secondary to obstructive adenopathy.  Bilateral lower extremity duplex yesterday revealed no DVT.  Patient currently on Lovenox for DVT prophylaxis.  Wound care for weeping of skin and bullae.  Anticipate discharge on  low dose aspirin. 5.   Failure to thrive  Patient has been eating poorly secondary to swallowing issues from possible upper neck adenopathy.  Previously discussed swallowing or GI evalution. 6.   Hyponatremia  Etiology secondary to dehydration.  Sodium improved with overnight IVF. 7.   Code status  DNR/DNI.  Approximately 40 minutes were with the patient and her husband discussing goals of care and direction of therapy.  She has decided to pursue Hospice.   Lequita Asal, MD  04/28/2020, 2:30 PM

## 2020-04-28 NOTE — Progress Notes (Signed)
[facility]  PROGRESS NOTE  [name] [id number] DOB: 01/09/1932 DOA: 04/27/2020 PCP: Marinda Elk, MD Admit date - 04/27/2020   Admitting Physician Val Riles, MD  Outpatient Primary MD for the patient is Marinda Elk, MD  LOS - 1 Brief Narrative   [name] is a 86 y.o. year old female with medical history significant for stage III follicular non-Hodgkin's lymphoma chronic lower extremity edema bilaterally, right-sided hydronephrosis status post external stent, chronic pain secondary to cancer, chronic hyponatremia who presented on 04/27/2020 with progressive diminished oral intake, worsening confusion and was found to have failure to thrive related to ongoing adenopathy from her known cancer.  Patient was a direct admission from her oncologist office    Subjective  Today patient states she wants to feel more comfortable and does not want to have any more aggressive procedures  A & P    Stage III non- Hodgkin's lymphoma complicated by right-sided hydronephrosis status post external stent placement.  Followed by Dr. Patsy Baltimore as outpatient -Patient does not want to continue further treatment for her lymphoma, discussed with her oncologist today -Patient is interested in hospice care -Social worker consulted to help arrange-focus on pain control  Failure to thrive.  Diminished oral intake, dysphagia like related to upper neck adenopathy.  Patient declines further GI evaluation -Would like to pursue comfort care/hospice measures, she's still in discussion with her husband about ability to talk with hospice liaison -Liberalize diet to dysphagia 3  Acute on chronic hyponatremia, in setting of diminished oral intake due to above.  Nadir of 124, improved to 129 -Continue D5 half-normal saline -Monitor BMP  Cancer-related pain, improving. -Continue home MS Contin -Have added IV morphine for breakthrough pain  Right-sided hydronephrosis,  status post stent placement, stable.  Nephrostomy tube draining clear yellow urine.  Bilateral lower extremity edema, right side greater than left.  Venous duplex with no acute changes.  In the setting of obstructive adenopathy -Closely monitor, wound care for weeping of skin/bullae.   Goals of care discussion.  Extensive discussion with husband and patient regarding poor quality of life.  Patient wanted to understand prognosis with her oncologist before making final decision but is leaning towards more comfort measures as she understands her symptoms will very likely not improve with treatment     Family Communication  : Husband updated at bedside  Code Status : DNR  Disposition Plan  :  Patient is from home. Anticipated d/c date: 2 to 3 days. Barriers to d/c or necessity for inpatient status:  Patient and husband still discussing very likely leading towards comfort care measures/hospice, will continue supportive measures with IV fluids for sodium, allow diet, social worker consulted to assist with hospice liaison discussions Consults  : Oncology  Procedures  : Venous duplex, 5/14  DVT Prophylaxis  :  Lovenox -   Lab Results  Component Value Date   PLT 200 04/28/2020    Diet :  Diet Order            DIET DYS 3 Room service appropriate? Yes with Assist; Fluid consistency: Thin  Diet effective now               Inpatient Medications Scheduled Meds: . acidophilus  1 capsule Oral Daily  . acyclovir  400 mg Oral Daily  . allopurinol  100 mg Oral Daily  . bisacodyl  5 mg Oral Daily  . enoxaparin (LOVENOX) injection  40 mg Subcutaneous Q24H  . lenalidomide  5 mg Oral Daily  . morphine  15 mg Oral Q12H  . pantoprazole  40 mg Oral Daily  . polyethylene glycol  17 g Oral Daily  . sodium chloride flush  3 mL Intravenous Q12H  . sodium chloride flush  3 mL Intravenous Q12H   Continuous Infusions: . sodium chloride    . dextrose 5 % and 0.45 % NaCl with KCl 40 mEq/L 75 mL/hr  at 04/28/20 0605   PRN Meds:.sodium chloride, acetaminophen **OR** acetaminophen, morphine injection, oxyCODONE, promethazine, sodium chloride flush  Antibiotics  :   Anti-infectives (From admission, onward)   Start     Dose/Rate Route Frequency Ordered Stop   04/27/20 1800  acyclovir (ZOVIRAX) 200 MG capsule 400 mg     400 mg Oral Daily 04/27/20 1757     04/27/20 1745  acyclovir (ZOVIRAX) tablet 400 mg  Status:  Discontinued     400 mg Oral Daily 04/27/20 1738 04/27/20 1757       Objective   Vitals:   04/27/20 1816 04/28/20 0102 04/28/20 0809 04/28/20 1630  BP:  (!) 146/64 (!) 145/66 (!) 144/62  Pulse:  76 70 74  Resp:  16 14 17   Temp:  98 F (36.7 C) 98.3 F (36.8 C) 98.7 F (37.1 C)  TempSrc:  Oral    SpO2:  95% 97% 97%  Weight: 54.3 kg     Height: 5\' 6"  (1.676 m)       SpO2: 97 %  Wt Readings from Last 3 Encounters:  04/27/20 54.3 kg  04/27/20 54.3 kg  04/23/20 53.8 kg     Intake/Output Summary (Last 24 hours) at 04/28/2020 1725 Last data filed at 04/28/2020 1600 Gross per 24 hour  Intake 1587.9 ml  Output 400 ml  Net 1187.9 ml    Physical Exam:    Elderly thin and frail female,  no distress Awake Alert, Oriented X 3, Normal affect No new F.N deficits,  .AT, Normal respiratory effort on room air, CTAB RRR,No Gallops,Rubs or new Murmurs,  +ve B.Sounds, Abd Soft, some tenderness with palpation of right lower quadrant without rebound tenderness or guarding Nephrostomy tube in place on right side draining clear yellow urine No Cyanosis, No new Rash or bruise     I have personally reviewed the following:   Data Reviewed:  CBC Recent Labs  Lab 04/23/20 0802 04/27/20 1115 04/28/20 0458  WBC 7.4 9.0 7.3  HGB 10.4* 9.7* 9.2*  HCT 30.3* 28.5* 27.2*  PLT 210 186 200  MCV 92.7 94.4 94.4  MCH 31.8 32.1 31.9  MCHC 34.3 34.0 33.8  RDW 12.8 13.2 13.0  LYMPHSABS 0.1* 0.1* 0.2*  MONOABS 0.7 0.8 1.3*  EOSABS 0.1 0.0 0.2  BASOSABS 0.0 0.0 0.0     Chemistries  Recent Labs  Lab 04/23/20 0802 04/27/20 1115 04/28/20 0458  NA 126* 124* 129*  K 3.8 3.4* 3.8  CL 91* 92* 98  CO2 25 24 25   GLUCOSE 162* 119* 100*  BUN 24* 32* 23  CREATININE 1.02* 0.83 0.78  CALCIUM 8.3* 8.0* 7.5*  MG  --  2.0 2.1  AST 23 20  --   ALT 21 21  --   ALKPHOS 135* 118  --   BILITOT 0.7 0.8  --    ------------------------------------------------------------------------------------------------------------------ No results for input(s): CHOL, HDL, LDLCALC, TRIG, CHOLHDL, LDLDIRECT in the last 72 hours.  No results found for: HGBA1C ------------------------------------------------------------------------------------------------------------------ No results for input(s): TSH, T4TOTAL, T3FREE, THYROIDAB in the last 72 hours.  Invalid input(s): FREET3 ------------------------------------------------------------------------------------------------------------------ No results for input(s): VITAMINB12, FOLATE, FERRITIN, TIBC, IRON, RETICCTPCT in the last 72 hours.  Coagulation profile No results for input(s): INR, PROTIME in the last 168 hours.  No results for input(s): DDIMER in the last 72 hours.  Cardiac Enzymes No results for input(s): CKMB, TROPONINI, MYOGLOBIN in the last 168 hours.  Invalid input(s): CK ------------------------------------------------------------------------------------------------------------------ No results found for: BNP  Micro Results Recent Results (from the past 240 hour(s))  SARS Coronavirus 2 by RT PCR (hospital order, performed in [facility] hospital lab) Nasopharyngeal Nasopharyngeal Swab     Status: None   Collection Time: 04/27/20  5:11 PM   Specimen: Nasopharyngeal Swab  Result Value Ref Range Status   SARS Coronavirus 2 NEGATIVE NEGATIVE Final    Comment: (NOTE) SARS-CoV-2 target nucleic acids are NOT DETECTED. The SARS-CoV-2 RNA is generally detectable in upper and lower respiratory specimens during  the acute phase of infection. The lowest concentration of SARS-CoV-2 viral copies this assay can detect is 250 copies / mL. A negative result does not preclude SARS-CoV-2 infection and should not be used as the sole basis for treatment or other patient management decisions.  A negative result may occur with improper specimen collection / handling, submission of specimen other than nasopharyngeal swab, presence of viral mutation(s) within the areas targeted by this assay, and inadequate number of viral copies (<250 copies / mL). A negative result must be combined with clinical observations, patient history, and epidemiological information. Fact Sheet for Patients:   StrictlyIdeas.no Fact Sheet for Healthcare Providers: BankingDealers.co.za This test is not yet approved or cleared  by the [place] FDA and has been authorized for detection and/or diagnosis of SARS-CoV-2 by FDA under an Emergency Use Authorization (EUA).  This EUA will remain in effect (meaning this test can be used) for the duration of the COVID-19 declaration under Section 564(b)(1) of the Act, 21 U.S.C. section 360bbb-3(b)(1), unless the authorization is terminated or revoked sooner. Performed at [facility], [address], [place], [place] [address]     Radiology Reports DG Chest 2 View  Result Date: 04/12/2020 CLINICAL DATA:  Shortness of breath and productive cough. EXAM: CHEST - 2 VIEW COMPARISON:  04/10/2020 FINDINGS: The cardiac silhouette, mediastinal and hilar contours are within normal limits and stable. Chronic pulmonary scarring changes with emphysema and areas of bronchiectasis. Small right upper lobe patchy airspace opacity could reflect a small infiltrate or area of atelectasis. No pleural effusions or worrisome pulmonary lesions. The bony thorax is intact. IMPRESSION: 1. Chronic underlying lung changes. 2. Persistent patchy right upper lobe  airspace opacity, small infiltrate or area of atelectasis. Electronically Signed   By: Marijo Sanes M.D.   On: 04/12/2020 15:39   DG Chest 2 View  Result Date: 04/10/2020 CLINICAL DATA:  86 year old female with increasing shortness of breath for 1 week. EXAM: CHEST - 2 VIEW COMPARISON:  Chest x-ray 11/26/2013. FINDINGS: Lung volumes are normal. Emphysematous changes are noted throughout the lungs. Ill-defined opacity in the right mid to upper lung, new compared to the prior examination. No acute consolidative airspace disease. No pleural effusions. No evidence of pulmonary edema. No pneumothorax. Heart size is normal. Upper mediastinal contours are within normal limits. IMPRESSION: 1. Ill-defined opacity in the right mid to upper lung which may reflect early bronchopneumonia. Followup PA and lateral chest X-ray is recommended in 3-4 weeks following trial of antibiotic therapy to ensure resolution and exclude underlying malignancy. 2. Emphysema. Electronically Signed   By:  Vinnie Langton M.D.   On: 04/10/2020 20:44   NM Pulmonary Perfusion  Result Date: 04/10/2020 CLINICAL DATA:  Shortness of breath 1 week. EXAM: NUCLEAR MEDICINE PERFUSION LUNG SCAN TECHNIQUE: Perfusion images were obtained in multiple projections after intravenous injection of radiopharmaceutical. Ventilation scans intentionally deferred if perfusion scan and chest x-ray adequate for interpretation during COVID 19 epidemic. RADIOPHARMACEUTICALS:  4.35 mCi Tc-80m MAA IV COMPARISON:  Chest x-ray today. FINDINGS: Examination demonstrates evidence of patient's known elevated left hemidiaphragm. Subtle nonsegmental streaky decreased uptake in the perihilar regions likely due in part to atelectasis. Possible small peripheral perfusion defect over the left apex and right upper lobe. No other peripheral segmental perfusion defects. IMPRESSION: Very low probability for pulmonary embolism. Electronically Signed   By: Marin Olp M.D.   On:  04/10/2020 14:02   Ultrasound renal complete  Result Date: 04/07/2020 CLINICAL DATA:  Known right hydronephrosis. EXAM: RENAL / URINARY TRACT ULTRASOUND COMPLETE COMPARISON:  CT scan March 21, 2020 FINDINGS: Right Kidney: Renal measurements: 10.1 x 5.2 x 5.6 cm = volume: 152.9 mL. There is cortical thinning. Moderate to severe hydronephrosis is again identified. The nephroureteral stent is identified. Left Kidney: Renal measurements: 10 x 3.7 x 4.6 cm = volume: 88 mL. Echogenicity within normal limits. No mass or hydronephrosis visualized. Bladder: The nephroureteral stent is seen within the bladder. The bladder is poorly distended. Other: None. IMPRESSION: 1. Nephroureteral stent extending from the region of the right renal pelvis to the bladder. Continued moderate to severe right hydronephrosis and cortical thinning. Electronically Signed   By: Dorise Bullion III M.D   On: 04/07/2020 20:04   NM PET Image Restag (PS) Skull Base To Thigh  Result Date: 04/05/2020 CLINICAL DATA:  Subsequent treatment strategy for grade 1 lymphoma. Follicular lymphoma. EXAM: NUCLEAR MEDICINE PET SKULL BASE TO THIGH TECHNIQUE: 6.3 mCi F-18 FDG was injected intravenously. Full-ring PET imaging was performed from the skull base to thigh after the radiotracer. CT data was obtained and used for attenuation correction and anatomic localization. Fasting blood glucose: 108 mg/dl COMPARISON:  CT 03/21/2020, PET-CT 11/08/2018 FINDINGS: Mediastinal blood pool activity: SUV max 166 Liver activity: SUV max 227 NECK: No hypermetabolic lymph nodes in the neck. Incidental CT findings: none CHEST: Cluster of lymph nodes in the prevascular space with hypermetabolic activity (SUV max equal 4.2). Activity greater than liver. Individually nodes measure up to 13 mm short axis (image 77/5). Incidental CT findings: Bronchiectasis in the LEFT upper lobe with some consolidation similar prior. Thickening along the LEFT superior oblique fissure is new but  without significant metabolic activity (image [id number]) ABDOMEN/PELVIS: Hypermetabolic periaortic retroperitoneal lymph nodes. The most intense lymph node position between the IVC and the aorta at the level the kidneys with SUV max equal 5.7. This node measures 22 cm (image 152/5). A similar lymph node LEFT aorta measuring 1.6 cm with SUV max equal 3.7. these retroperitoneal lymph nodes have metabolic activity greater than liver activity (SUV max equal 2.3). NO Clear hypermetabolic pelvic lymph nodes. No hypermetabolic inguinal nodes. Incidental CT findings: RIGHT ureteral stent in place with moderate hydronephrosis. Or renal excretion on the RIGHT compared to the LEFT SKELETON: No focal hypermetabolic activity to suggest skeletal metastasis. Incidental CT findings: none IMPRESSION: 1. Hypermetabolic prevascular lymph nodes new from comparison PET CT 11/08/2018 consistent with new lymphoma recurrence ([place] 5). 2. New hypermetabolic periaortic lymphadenopathy ([place] 5) 3. Normal spleen 4. Normal marrow. 5. Hydronephrosis of the RIGHT renal collecting system with stent in place. Electronically Signed  By: Suzy Bouchard M.D.   On: 04/05/2020 15:38   US Venous Img Lower Bilateral (DVT)  Result Date: 04/27/2020 CLINICAL DATA:  Persistent lower extremity swelling over the last 2 months. EXAM: Right LOWER EXTREMITY VENOUS DOPPLER ULTRASOUND TECHNIQUE: Gray-scale sonography with compression, as well as color and duplex ultrasound, were performed to evaluate the deep venous system(s) from the level of the common femoral vein through the popliteal and proximal calf veins. COMPARISON:  None. FINDINGS: VENOUS Normal compressibility of the common femoral, superficial femoral, and popliteal veins, as well as the visualized calf veins. Visualized portions of profunda femoral vein and great saphenous vein unremarkable. No filling defects to suggest DVT on grayscale or color Doppler imaging. Doppler waveforms show normal  direction of venous flow, normal respiratory plasticity and response to augmentation. Limited views of the contralateral common femoral vein are unremarkable. OTHER Right Baker cyst measuring up to 4.5 cm. Limitations: none IMPRESSION: No evidence of deep venous thrombosis. Electronically Signed   By: Nelson Chimes M.D.   On: 04/27/2020 15:27   US Venous Img Lower Unilateral Right  Addendum Date: 04/10/2020   ADDENDUM REPORT: 04/10/2020 13:37 ADDENDUM: Incidentally noted approximately 3.4 x 0.8 x 2.9 cm anechoic fluid collection with the right popliteal fossa compatible with a Baker's cyst. Electronically Signed   By: Sandi Mariscal M.D.   On: 04/10/2020 13:37   Result Date: 04/10/2020 CLINICAL DATA:  Right lower extremity pain and edema. History of lymphoma. Evaluate for DVT. EXAM: RIGHT LOWER EXTREMITY VENOUS DOPPLER ULTRASOUND TECHNIQUE: Gray-scale sonography with graded compression, as well as color Doppler and duplex ultrasound were performed to evaluate the lower extremity deep venous systems from the level of the common femoral vein and including the common femoral, femoral, profunda femoral, popliteal and calf veins including the posterior tibial, peroneal and gastrocnemius veins when visible. The superficial great saphenous vein was also interrogated. Spectral Doppler was utilized to evaluate flow at rest and with distal augmentation maneuvers in the common femoral, femoral and popliteal veins. COMPARISON:  Right lower extremity venous Doppler ultrasound-03/14/2020. FINDINGS: Contralateral Common Femoral Vein: Respiratory phasicity is normal and symmetric with the symptomatic side. No evidence of thrombus. Normal compressibility. Common Femoral Vein: No evidence of thrombus. Normal compressibility, respiratory phasicity and response to augmentation. Saphenofemoral Junction: No evidence of thrombus. Normal compressibility and flow on color Doppler imaging. Profunda Femoral Vein: No evidence of thrombus.  Normal compressibility and flow on color Doppler imaging. Femoral Vein: No evidence of thrombus. Normal compressibility, respiratory phasicity and response to augmentation. Popliteal Vein: No evidence of thrombus. Normal compressibility, respiratory phasicity and response to augmentation. Calf Veins: No evidence of thrombus. Normal compressibility and flow on color Doppler imaging. Superficial Great Saphenous Vein: No evidence of thrombus. Normal compressibility. Venous Reflux:  None. Other Findings:  None. IMPRESSION: No evidence of DVT within the right lower extremity. Electronically Signed: By: Sandi Mariscal M.D. On: 04/10/2020 12:40   IR NEPHROSTOMY PLACEMENT RIGHT  Result Date: 04/13/2020 INDICATION: 86 year old female with persisting hydronephrosis after stent placement EXAM: IMAGE GUIDED PLACEMENT OF RIGHT PERCUTANEOUS NEPHROSTOMY COMPARISON:  None. MEDICATIONS: NONE ANESTHESIA/SEDATION: Fentanyl 0.5 mcg IV; Versed 25 mg IV Moderate Sedation Time:  15 minutes The patient was continuously monitored during the procedure by the interventional radiology nurse under my direct supervision. CONTRAST:  10 cc-administered into the collecting system(s) FLUOROSCOPY TIME:  Fluoroscopy Time: 0 minutes 48 seconds (3.7 mGy). COMPLICATIONS: None PROCEDURE: Informed written consent was obtained from the patient after a thorough discussion of the  procedural risks, benefits and alternatives. All questions were addressed. Maximal Sterile Barrier Technique was utilized including caps, mask, sterile gowns, sterile gloves, sterile drape, hand hygiene and skin antiseptic. A timeout was performed prior to the initiation of the procedure. Patient positioned prone position on the fluoroscopy table. Ultrasound survey of the right flank was performed with images stored and sent to PACs. The patient was then prepped and draped in the usual sterile fashion. 1% lidocaine was used to anesthetize the skin and subcutaneous tissues for local  anesthesia. A Chiba needle was then used to access a posterior inferior calyx with ultrasound guidance. With spontaneous urine returned through the needle, passage of an 018 micro wire into the collecting system was performed under fluoroscopy. A small incision was made with an 11 blade scalpel, and the needle was removed from the wire. An Accustick system was then advanced over the wire into the collecting system under fluoroscopy. The metal stiffener and inner dilator were removed, and then a sample of fluid was aspirated through the 4 French outer sheath. Bentson wire was passed into the collecting system and the sheath removed. Ten French dilation of the soft tissues was performed. Using modified Seldinger technique, a 10 French pigtail catheter drain was placed over the Bentson wire. Wire and inner stiffener removed, and the pigtail was formed in the collecting system. Small amount of contrast confirmed position of the catheter. Patient tolerated the procedure well and remained hemodynamically stable throughout. No complications were encountered and no significant blood loss encountered IMPRESSION: Status post right-sided percutaneous nephrostomy. Signed, Dulcy Fanny. Dellia Nims, RPVI [facility] Radiology Electronically Signed   By: Corrie Mckusick D.O.   On: 04/13/2020 12:14     Time Spent in minutes  30     Desiree Hane M.D on 04/28/2020 at 5:25 PM  To page go to [email or web address] - password [email or web address]

## 2020-04-29 DIAGNOSIS — C8288 Other types of follicular lymphoma, lymph nodes of multiple sites: Secondary | ICD-10-CM

## 2020-04-29 LAB — CBC WITH DIFFERENTIAL/PLATELET
Abs Immature Granulocytes: 0.08 10*3/uL — ABNORMAL HIGH (ref 0.00–0.07)
Basophils Absolute: 0.1 10*3/uL (ref 0.0–0.1)
Basophils Relative: 1 %
Eosinophils Absolute: 0.3 10*3/uL (ref 0.0–0.5)
Eosinophils Relative: 4 %
HCT: 26.2 % — ABNORMAL LOW (ref 36.0–46.0)
Hemoglobin: 9.1 g/dL — ABNORMAL LOW (ref 12.0–15.0)
Immature Granulocytes: 1 %
Lymphocytes Relative: 2 %
Lymphs Abs: 0.2 10*3/uL — ABNORMAL LOW (ref 0.7–4.0)
MCH: 32.3 pg (ref 26.0–34.0)
MCHC: 34.7 g/dL (ref 30.0–36.0)
MCV: 92.9 fL (ref 80.0–100.0)
Monocytes Absolute: 1.3 10*3/uL — ABNORMAL HIGH (ref 0.1–1.0)
Monocytes Relative: 16 %
Neutro Abs: 6.1 10*3/uL (ref 1.7–7.7)
Neutrophils Relative %: 76 %
Platelets: 181 10*3/uL (ref 150–400)
RBC: 2.82 MIL/uL — ABNORMAL LOW (ref 3.87–5.11)
RDW: 13.4 % (ref 11.5–15.5)
WBC: 8 10*3/uL (ref 4.0–10.5)
nRBC: 0 % (ref 0.0–0.2)

## 2020-04-29 LAB — BASIC METABOLIC PANEL
Anion gap: 5 (ref 5–15)
BUN: 14 mg/dL (ref 8–23)
CO2: 24 mmol/L (ref 22–32)
Calcium: 7.3 mg/dL — ABNORMAL LOW (ref 8.9–10.3)
Chloride: 102 mmol/L (ref 98–111)
Creatinine, Ser: 0.6 mg/dL (ref 0.44–1.00)
GFR calc Af Amer: >60 mL/min (ref >60)
GFR calc non Af Amer: >60 mL/min (ref >60)
Glucose, Bld: 101 mg/dL — ABNORMAL HIGH (ref 70–99)
Potassium: 4.6 mmol/L (ref 3.5–5.1)
Sodium: 131 mmol/L — ABNORMAL LOW (ref 135–145)

## 2020-04-29 NOTE — Progress Notes (Signed)
[facility]  PROGRESS NOTE  [name] [id number] DOB: January 04, 1932 DOA: 04/27/2020 PCP: Marinda Elk, MD Admit date - 04/27/2020   Admitting Physician Val Riles, MD  Outpatient Primary MD for the patient is Marinda Elk, MD  LOS - 2 Brief Narrative   [name] is a 86 y.o. year old female with medical history significant for stage III follicular non-Hodgkin's lymphoma chronic lower extremity edema bilaterally, right-sided hydronephrosis status post external stent, chronic pain secondary to cancer, chronic hyponatremia who presented on 04/27/2020 with progressive diminished oral intake, worsening confusion and was found to have failure to thrive related to ongoing adenopathy from her known cancer.  Patient was a direct admission from her oncologist office    Subjective  Today patient feels her pain is being adequately controlled.  Was able to eat a bit of her dinner last night.  A & P    Stage III non- Hodgkin's lymphoma complicated by right-sided hydronephrosis status post external stent placement.  Followed by Dr. Patsy Baltimore as outpatient -Patient does not want to continue further treatment for her lymphoma, discussed with her oncologist today -Patient family confirm wanting to pursue hospice care -Social worker consulted to help arrange-via hospice liaison, while in hospital will focus on pain control  Failure to thrive.  Diminished oral intake, dysphagia like related to upper neck adenopathy.  Patient declines further GI evaluation -Would like to pursue comfort care/hospice measures -Liberalize diet to dysphagia 3  Acute on chronic hyponatremia, in setting of diminished oral intake due to above.  Nadir of 124, improved to 132 -Discontinue IV fluids -Continue to encourage oral intake as tolerated -No further lab draws  Cancer-related pain, stable -Continue home MS Contin -IV morphine for breakthrough pain  Right-sided hydronephrosis,  status post stent placement, stable.  Nephrostomy tube draining clear yellow urine.  Bilateral lower extremity edema, right side greater than left.  Venous duplex with no acute changes.  In the setting of obstructive adenopathy -Closely monitor, wound care for weeping of skin/bullae.  Goals of care discussion.  Extensive discussion with husband and patient regarding poor quality of life on 5/15.  Patient also discussed with her oncologist she does not want to pursue further treatment for lymphoma and is interested in hospice care.       Family Communication  : Husband, son updated at bedside  Code Status : DNR  Disposition Plan  :  Patient is from home. Anticipated d/c date:  24 to 48 hours. Barriers to d/c or necessity for inpatient status:   comfort care measures/hospice, will continue supportive measures , social worker consulted to assist with hospice liaison discussions Consults  : Oncology  Procedures  : Venous duplex, 5/14  DVT Prophylaxis  :  Lovenox -   Lab Results  Component Value Date   PLT 181 04/29/2020    Diet :  Diet Order            DIET DYS 3 Room service appropriate? Yes with Assist; Fluid consistency: Thin  Diet effective now               Inpatient Medications Scheduled Meds: . acidophilus  1 capsule Oral Daily  . acyclovir  400 mg Oral Daily  . bisacodyl  5 mg Oral Daily  . lenalidomide  5 mg Oral Daily  . morphine  15 mg Oral Q12H  . pantoprazole  40 mg Oral Daily  . polyethylene glycol  17 g Oral Daily   Continuous Infusions:  PRN Meds:.acetaminophen **OR** acetaminophen, morphine injection, oxyCODONE, promethazine  Antibiotics  :   Anti-infectives (From admission, onward)   Start     Dose/Rate Route Frequency Ordered Stop   04/27/20 1800  acyclovir (ZOVIRAX) 200 MG capsule 400 mg     400 mg Oral Daily 04/27/20 1757     04/27/20 1745  acyclovir (ZOVIRAX) tablet 400 mg  Status:  Discontinued     400 mg Oral Daily 04/27/20 1738 04/27/20  1757       Objective   Vitals:   04/28/20 0809 04/28/20 1630 04/29/20 0025 04/29/20 0757  BP: (!) 145/66 (!) 144/62 129/61 (!) 154/66  Pulse: 70 74 68 80  Resp: 14 17 16 18   Temp: 98.3 F (36.8 C) 98.7 F (37.1 C) 99.1 F (37.3 C) 98.2 F (36.8 C)  TempSrc:   Oral   SpO2: 97% 97% 96% 97%  Weight:      Height:        SpO2: 97 %  Wt Readings from Last 3 Encounters:  04/27/20 54.3 kg  04/27/20 54.3 kg  04/23/20 53.8 kg     Intake/Output Summary (Last 24 hours) at 04/29/2020 1155 Last data filed at 04/29/2020 0900 Gross per 24 hour  Intake 1662.07 ml  Output 600 ml  Net 1062.07 ml    Physical Exam:  Elderly thin and frail female,  no distress Awake Alert, Oriented X 3, Normal affect No new F.N deficits,  [place].AT, Normal respiratory effort on room air, Nephrostomy tube in place on right side draining clear yellow urine No Cyanosis, No new Rash or bruise     I have personally reviewed the following:   Data Reviewed:  CBC Recent Labs  Lab 04/23/20 0802 04/27/20 1115 04/28/20 0458 04/29/20 0530  WBC 7.4 9.0 7.3 8.0  HGB 10.4* 9.7* 9.2* 9.1*  HCT 30.3* 28.5* 27.2* 26.2*  PLT 210 186 200 181  MCV 92.7 94.4 94.4 92.9  MCH 31.8 32.1 31.9 32.3  MCHC 34.3 34.0 33.8 34.7  RDW 12.8 13.2 13.0 13.4  LYMPHSABS 0.1* 0.1* 0.2* 0.2*  MONOABS 0.7 0.8 1.3* 1.3*  EOSABS 0.1 0.0 0.2 0.3  BASOSABS 0.0 0.0 0.0 0.1    Chemistries  Recent Labs  Lab 04/23/20 0802 04/27/20 1115 04/28/20 0458 04/29/20 0530  NA 126* 124* 129* 131*  K 3.8 3.4* 3.8 4.6  CL 91* 92* 98 102  CO2 25 24 25 24   GLUCOSE 162* 119* 100* 101*  BUN 24* 32* 23 14  CREATININE 1.02* 0.83 0.78 0.60  CALCIUM 8.3* 8.0* 7.5* 7.3*  MG  --  2.0 2.1  --   AST 23 20  --   --   ALT 21 21  --   --   ALKPHOS 135* 118  --   --   BILITOT 0.7 0.8  --   --    ------------------------------------------------------------------------------------------------------------------ No results for input(s): CHOL,  HDL, LDLCALC, TRIG, CHOLHDL, LDLDIRECT in the last 72 hours.  No results found for: HGBA1C ------------------------------------------------------------------------------------------------------------------ No results for input(s): TSH, T4TOTAL, T3FREE, THYROIDAB in the last 72 hours.  Invalid input(s): FREET3 ------------------------------------------------------------------------------------------------------------------ No results for input(s): VITAMINB12, FOLATE, FERRITIN, TIBC, IRON, RETICCTPCT in the last 72 hours.  Coagulation profile No results for input(s): INR, PROTIME in the last 168 hours.  No results for input(s): DDIMER in the last 72 hours.  Cardiac Enzymes No results for input(s): CKMB, TROPONINI, MYOGLOBIN in the last 168 hours.  Invalid input(s): CK ------------------------------------------------------------------------------------------------------------------ No results found for: BNP  Micro  Results Recent Results (from the past 240 hour(s))  SARS Coronavirus 2 by RT PCR (hospital order, performed in [facility] hospital lab) Nasopharyngeal Nasopharyngeal Swab     Status: None   Collection Time: 04/27/20  5:11 PM   Specimen: Nasopharyngeal Swab  Result Value Ref Range Status   SARS Coronavirus 2 NEGATIVE NEGATIVE Final    Comment: (NOTE) SARS-CoV-2 target nucleic acids are NOT DETECTED. The SARS-CoV-2 RNA is generally detectable in upper and lower respiratory specimens during the acute phase of infection. The lowest concentration of SARS-CoV-2 viral copies this assay can detect is 250 copies / mL. A negative result does not preclude SARS-CoV-2 infection and should not be used as the sole basis for treatment or other patient management decisions.  A negative result may occur with improper specimen collection / handling, submission of specimen other than nasopharyngeal swab, presence of viral mutation(s) within the areas targeted by this assay, and inadequate  number of viral copies (<250 copies / mL). A negative result must be combined with clinical observations, patient history, and epidemiological information. Fact Sheet for Patients:   StrictlyIdeas.no Fact Sheet for Healthcare Providers: BankingDealers.co.za This test is not yet approved or cleared  by the [place] FDA and has been authorized for detection and/or diagnosis of SARS-CoV-2 by FDA under an Emergency Use Authorization (EUA).  This EUA will remain in effect (meaning this test can be used) for the duration of the COVID-19 declaration under Section 564(b)(1) of the Act, 21 U.S.C. section 360bbb-3(b)(1), unless the authorization is terminated or revoked sooner. Performed at [facility], [address] [place], [place] [address]     Radiology Reports DG Chest 2 View  Result Date: 04/12/2020 CLINICAL DATA:  Shortness of breath and productive cough. EXAM: CHEST - 2 VIEW COMPARISON:  04/10/2020 FINDINGS: The cardiac silhouette, mediastinal and hilar contours are within normal limits and stable. Chronic pulmonary scarring changes with emphysema and areas of bronchiectasis. Small right upper lobe patchy airspace opacity could reflect a small infiltrate or area of atelectasis. No pleural effusions or worrisome pulmonary lesions. The bony thorax is intact. IMPRESSION: 1. Chronic underlying lung changes. 2. Persistent patchy right upper lobe airspace opacity, small infiltrate or area of atelectasis. Electronically Signed   By: Marijo Sanes M.D.   On: 04/12/2020 15:39   DG Chest 2 View  Result Date: 04/10/2020 CLINICAL DATA:  86 year old female with increasing shortness of breath for 1 week. EXAM: CHEST - 2 VIEW COMPARISON:  Chest x-ray 11/26/2013. FINDINGS: Lung volumes are normal. Emphysematous changes are noted throughout the lungs. Ill-defined opacity in the right mid to upper lung, new compared to the prior examination. No  acute consolidative airspace disease. No pleural effusions. No evidence of pulmonary edema. No pneumothorax. Heart size is normal. Upper mediastinal contours are within normal limits. IMPRESSION: 1. Ill-defined opacity in the right mid to upper lung which may reflect early bronchopneumonia. Followup PA and lateral chest X-ray is recommended in 3-4 weeks following trial of antibiotic therapy to ensure resolution and exclude underlying malignancy. 2. Emphysema. Electronically Signed   By: Vinnie Langton M.D.   On: 04/10/2020 20:44   NM Pulmonary Perfusion  Result Date: 04/10/2020 CLINICAL DATA:  Shortness of breath 1 week. EXAM: NUCLEAR MEDICINE PERFUSION LUNG SCAN TECHNIQUE: Perfusion images were obtained in multiple projections after intravenous injection of radiopharmaceutical. Ventilation scans intentionally deferred if perfusion scan and chest x-ray adequate for interpretation during COVID 19 epidemic. RADIOPHARMACEUTICALS:  4.35 mCi Tc-75m MAA IV COMPARISON:  Chest x-ray today. FINDINGS: Examination demonstrates evidence of patient's known elevated left hemidiaphragm. Subtle nonsegmental streaky decreased uptake in the perihilar regions likely due in part to atelectasis. Possible small peripheral perfusion defect over the left apex and right upper lobe. No other peripheral segmental perfusion defects. IMPRESSION: Very low probability for pulmonary embolism. Electronically Signed   By: Marin Olp M.D.   On: 04/10/2020 14:02   Ultrasound renal complete  Result Date: 04/07/2020 CLINICAL DATA:  Known right hydronephrosis. EXAM: RENAL / URINARY TRACT ULTRASOUND COMPLETE COMPARISON:  CT scan March 21, 2020 FINDINGS: Right Kidney: Renal measurements: 10.1 x 5.2 x 5.6 cm = volume: 152.9 mL. There is cortical thinning. Moderate to severe hydronephrosis is again identified. The nephroureteral stent is identified. Left Kidney: Renal measurements: 10 x 3.7 x 4.6 cm = volume: 88 mL. Echogenicity within normal  limits. No mass or hydronephrosis visualized. Bladder: The nephroureteral stent is seen within the bladder. The bladder is poorly distended. Other: None. IMPRESSION: 1. Nephroureteral stent extending from the region of the right renal pelvis to the bladder. Continued moderate to severe right hydronephrosis and cortical thinning. Electronically Signed   By: Dorise Bullion III M.D   On: 04/07/2020 20:04   NM PET Image Restag (PS) Skull Base To Thigh  Result Date: 04/05/2020 CLINICAL DATA:  Subsequent treatment strategy for grade 1 lymphoma. Follicular lymphoma. EXAM: NUCLEAR MEDICINE PET SKULL BASE TO THIGH TECHNIQUE: 6.3 mCi F-18 FDG was injected intravenously. Full-ring PET imaging was performed from the skull base to thigh after the radiotracer. CT data was obtained and used for attenuation correction and anatomic localization. Fasting blood glucose: 108 mg/dl COMPARISON:  CT 03/21/2020, PET-CT 11/08/2018 FINDINGS: Mediastinal blood pool activity: SUV max 166 Liver activity: SUV max 227 NECK: No hypermetabolic lymph nodes in the neck. Incidental CT findings: none CHEST: Cluster of lymph nodes in the prevascular space with hypermetabolic activity (SUV max equal 4.2). Activity greater than liver. Individually nodes measure up to 13 mm short axis (image 77/5). Incidental CT findings: Bronchiectasis in the LEFT upper lobe with some consolidation similar prior. Thickening along the LEFT superior oblique fissure is new but without significant metabolic activity (image [id number]) ABDOMEN/PELVIS: Hypermetabolic periaortic retroperitoneal lymph nodes. The most intense lymph node position between the IVC and the aorta at the level the kidneys with SUV max equal 5.7. This node measures 22 cm (image 152/5). A similar lymph node LEFT aorta measuring 1.6 cm with SUV max equal 3.7. these retroperitoneal lymph nodes have metabolic activity greater than liver activity (SUV max equal 2.3). NO Clear hypermetabolic pelvic lymph nodes.  No hypermetabolic inguinal nodes. Incidental CT findings: RIGHT ureteral stent in place with moderate hydronephrosis. Or renal excretion on the RIGHT compared to the LEFT SKELETON: No focal hypermetabolic activity to suggest skeletal metastasis. Incidental CT findings: none IMPRESSION: 1. Hypermetabolic prevascular lymph nodes new from comparison PET CT 11/08/2018 consistent with new lymphoma recurrence ([place] 5). 2. New hypermetabolic periaortic lymphadenopathy ([place] 5) 3. Normal spleen 4. Normal marrow. 5. Hydronephrosis of the RIGHT renal collecting system with stent in place. Electronically Signed   By: Suzy Bouchard M.D.   On: 04/05/2020 15:38   US Venous Img Lower Bilateral (DVT)  Result Date: 04/27/2020 CLINICAL DATA:  Persistent lower extremity swelling over the last 2 months. EXAM: Right LOWER EXTREMITY VENOUS DOPPLER ULTRASOUND TECHNIQUE: Gray-scale sonography with compression, as well as color and duplex ultrasound, were performed to evaluate the deep venous system(s) from the level of the common femoral vein  through the popliteal and proximal calf veins. COMPARISON:  None. FINDINGS: VENOUS Normal compressibility of the common femoral, superficial femoral, and popliteal veins, as well as the visualized calf veins. Visualized portions of profunda femoral vein and great saphenous vein unremarkable. No filling defects to suggest DVT on grayscale or color Doppler imaging. Doppler waveforms show normal direction of venous flow, normal respiratory plasticity and response to augmentation. Limited views of the contralateral common femoral vein are unremarkable. OTHER Right Baker cyst measuring up to 4.5 cm. Limitations: none IMPRESSION: No evidence of deep venous thrombosis. Electronically Signed   By: Nelson Chimes M.D.   On: 04/27/2020 15:27   US Venous Img Lower Unilateral Right  Addendum Date: 04/10/2020   ADDENDUM REPORT: 04/10/2020 13:37 ADDENDUM: Incidentally noted approximately 3.4 x  0.8 x 2.9 cm anechoic fluid collection with the right popliteal fossa compatible with a Baker's cyst. Electronically Signed   By: Sandi Mariscal M.D.   On: 04/10/2020 13:37   Result Date: 04/10/2020 CLINICAL DATA:  Right lower extremity pain and edema. History of lymphoma. Evaluate for DVT. EXAM: RIGHT LOWER EXTREMITY VENOUS DOPPLER ULTRASOUND TECHNIQUE: Gray-scale sonography with graded compression, as well as color Doppler and duplex ultrasound were performed to evaluate the lower extremity deep venous systems from the level of the common femoral vein and including the common femoral, femoral, profunda femoral, popliteal and calf veins including the posterior tibial, peroneal and gastrocnemius veins when visible. The superficial great saphenous vein was also interrogated. Spectral Doppler was utilized to evaluate flow at rest and with distal augmentation maneuvers in the common femoral, femoral and popliteal veins. COMPARISON:  Right lower extremity venous Doppler ultrasound-03/14/2020. FINDINGS: Contralateral Common Femoral Vein: Respiratory phasicity is normal and symmetric with the symptomatic side. No evidence of thrombus. Normal compressibility. Common Femoral Vein: No evidence of thrombus. Normal compressibility, respiratory phasicity and response to augmentation. Saphenofemoral Junction: No evidence of thrombus. Normal compressibility and flow on color Doppler imaging. Profunda Femoral Vein: No evidence of thrombus. Normal compressibility and flow on color Doppler imaging. Femoral Vein: No evidence of thrombus. Normal compressibility, respiratory phasicity and response to augmentation. Popliteal Vein: No evidence of thrombus. Normal compressibility, respiratory phasicity and response to augmentation. Calf Veins: No evidence of thrombus. Normal compressibility and flow on color Doppler imaging. Superficial Great Saphenous Vein: No evidence of thrombus. Normal compressibility. Venous Reflux:  None. Other  Findings:  None. IMPRESSION: No evidence of DVT within the right lower extremity. Electronically Signed: By: Sandi Mariscal M.D. On: 04/10/2020 12:40   IR NEPHROSTOMY PLACEMENT RIGHT  Result Date: 04/13/2020 INDICATION: 86 year old female with persisting hydronephrosis after stent placement EXAM: IMAGE GUIDED PLACEMENT OF RIGHT PERCUTANEOUS NEPHROSTOMY COMPARISON:  None. MEDICATIONS: NONE ANESTHESIA/SEDATION: Fentanyl 0.5 mcg IV; Versed 25 mg IV Moderate Sedation Time:  15 minutes The patient was continuously monitored during the procedure by the interventional radiology nurse under my direct supervision. CONTRAST:  10 cc-administered into the collecting system(s) FLUOROSCOPY TIME:  Fluoroscopy Time: 0 minutes 48 seconds (3.7 mGy). COMPLICATIONS: None PROCEDURE: Informed written consent was obtained from the patient after a thorough discussion of the procedural risks, benefits and alternatives. All questions were addressed. Maximal Sterile Barrier Technique was utilized including caps, mask, sterile gowns, sterile gloves, sterile drape, hand hygiene and skin antiseptic. A timeout was performed prior to the initiation of the procedure. Patient positioned prone position on the fluoroscopy table. Ultrasound survey of the right flank was performed with images stored and sent to PACs. The patient was then prepped and  draped in the usual sterile fashion. 1% lidocaine was used to anesthetize the skin and subcutaneous tissues for local anesthesia. A Chiba needle was then used to access a posterior inferior calyx with ultrasound guidance. With spontaneous urine returned through the needle, passage of an 018 micro wire into the collecting system was performed under fluoroscopy. A small incision was made with an 11 blade scalpel, and the needle was removed from the wire. An Accustick system was then advanced over the wire into the collecting system under fluoroscopy. The metal stiffener and inner dilator were removed, and  then a sample of fluid was aspirated through the 4 French outer sheath. Bentson wire was passed into the collecting system and the sheath removed. Ten French dilation of the soft tissues was performed. Using modified Seldinger technique, a 10 French pigtail catheter drain was placed over the Bentson wire. Wire and inner stiffener removed, and the pigtail was formed in the collecting system. Small amount of contrast confirmed position of the catheter. Patient tolerated the procedure well and remained hemodynamically stable throughout. No complications were encountered and no significant blood loss encountered IMPRESSION: Status post right-sided percutaneous nephrostomy. Signed, Dulcy Fanny. Dellia Nims, RPVI [facility] Radiology Electronically Signed   By: Corrie Mckusick D.O.   On: 04/13/2020 12:14     Time Spent in minutes  30     Desiree Hane M.D on 04/29/2020 at 11:55 AM  To page go to [email or web address] - password [facility]

## 2020-04-30 ENCOUNTER — Inpatient Hospital Stay: Payer: Medicare Other

## 2020-04-30 ENCOUNTER — Inpatient Hospital Stay: Payer: Medicare Other | Admitting: Hospice and Palliative Medicine

## 2020-04-30 DIAGNOSIS — Z515 Encounter for palliative care: Secondary | ICD-10-CM

## 2020-04-30 DIAGNOSIS — G893 Neoplasm related pain (acute) (chronic): Secondary | ICD-10-CM

## 2020-04-30 NOTE — Progress Notes (Signed)
[facility] Room # [facility]) Hospital Liaison RN note:  Received request from Billey Chang, NP and Transitions of Care Manager, Randel Books, for hospice services at home after discharge. Chart and patient information under review by [facility] physician. Hospice eligibility has been approved.  Spoke with patient and spouse, Brandy Muller, to initiate education related to hospice philosophy, services and to answer any questions. Patient/spouse verbalized understanding of information given.Per discussion, the plan is for discharge home by private car on 05.18.  DME needs discussed and patient has no needs at this time.  Please send signed and completed DNR home with patient/family. Please provide prescriptions at discharge as needed to ensure ongoing symptom management.  [facility] information and contact numbers given to spouse. Above information shared with Randel Books, Transitions of Care.  Please call with any hospice questions or concerns.  Thank you for the opportunity to participate in this patient's care.  Zandra Abts, RN [facility] Liaison  [phone]

## 2020-04-30 NOTE — TOC Progression Note (Addendum)
Transition of Care [facility]) - Progression Note    Patient Details  Name: [name] MRN: [id number] Date of Birth: 1932/06/23  Transition of Care [facility]) CM/SW Contact  Iniya Matzek, Gardiner Rhyme, LCSW Phone Number: 04/30/2020, 1:27 PM  Clinical Narrative:   Spoke with [facility] can take pt tomorrow. Have completed Fl2 and left message for Joelene Millin will await return call. Will arrange EMS transfer tomorrow.  3:00 pm [facility] will need an new COVID test before pt transfers tomorrow. Have asked MD to order       Expected Discharge Plan and Services                                                 Social Determinants of Health (SDOH) Interventions    Readmission Risk Interventions No flowsheet data found.

## 2020-04-30 NOTE — Care Management Important Message (Signed)
Important Message  Patient Details  Name: [name] MRN: [id number] Date of Birth: Apr 13, 1932   Medicare Important Message Given:  Yes     Dannette Barbara 04/30/2020, 1:56 PM

## 2020-04-30 NOTE — Consult Note (Signed)
[place]  [phone] [phone]   Name: [name] Date: 04/30/2020 MRN: [id number]  DOB: 1932-01-14  Patient Care Team: Marinda Elk, MD as PCP - General (Physician Assistant) Lequita Asal, MD as Referring Physician (Hematology and Oncology) Noreene Filbert, MD as Referring Physician (Radiation Oncology)    REASON FOR CONSULTATION: [name] is a 86 y.o. female with multiple medical problems including stage IIIa follicular non-Hodgkin's lymphoma with obstructive adenopathy causing right-sided hydronephrosis status post nephrostomy tube placement, who is on current treatment with Revlimid and obinutuzumab.  Patient has had right-sided flank pain secondary to her obstructive adenopathy.  She has also had fatigue.    Patient was admitted on 04/27/2020 with dysphagia and failure to thrive.  She was referred to palliative care to help address goals and manage ongoing symptoms..   SOCIAL HISTORY:     reports that she has never smoked. She has never used smokeless tobacco. She reports that she does not drink alcohol or use drugs.   Patient is married and lives at home with her husband of 81 years.  She has a son in [place] and a daughter in [place], [place].  Patient retired as an Automotive engineer.  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  ADVANCE DIRECTIVES:  On file  CODE STATUS: DNR  PAST MEDICAL HISTORY: Past Medical History:  Diagnosis Date  . Arthritis    osteoporosis also. prolia treatments every 6 months  . Baker cyst, right 03/2020  . Cancer [facility]) 2013   Non-Hodgkin Lymphoma with chemo and rad tx  . Complication of anesthesia   . Dehydration    low sodium levels  . Dysrhythmia   . Essential tremor   . Follicular lymphoma grade I ([place]) 08/06/2002  . GERD (gastroesophageal reflux disease)   . Hearing loss of both ears    wears bilateral hearing  aides  . 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 History of blood transfusion   . History of chemotherapy   . History of radiation therapy   . Hypertension   . Labile blood pressure   . Meniere's disease    with bilateral hearing loss  . Mini stroke [facility])    summer 2014  . Osteoporosis   . PONV (postoperative nausea and vomiting)   . 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 Stroke [facility]) 2015   possible mini stroke. loss of conciousness while sitting at table. could not diagnose  . Syncope     PAST SURGICAL HISTORY:  Past Surgical History:  Procedure Laterality Date  . APPENDECTOMY    . BONE MARROW BIOPSY  08/09/2002  . BREAST LUMPECTOMY  1950s   benign  . CATARACT EXTRACTION  03/2009  . COLONOSCOPY    . CYSTOSCOPY WITH STENT PLACEMENT Right 03/29/2020   Procedure: CYSTOSCOPY WITH STENT PLACEMENT;  Surgeon: Hollice Espy, MD;  Location: [facility] ORS;  Service: Urology;  Laterality: Right;  . ESOPHAGOGASTRODUODENOSCOPY (EGD) WITH PROPOFOL N/A 09/23/2019   Procedure: ESOPHAGOGASTRODUODENOSCOPY (EGD) WITH PROPOFOL;  Surgeon: Jonathon Bellows, MD;  Location: [facility] ENDOSCOPY;  Service: Gastroenterology;  Laterality: N/A;  . FRACTURE SURGERY Left    wrist  . IR NEPHROSTOMY PLACEMENT RIGHT  04/13/2020  . LYMPH NODE BIOPSY  08/07/2002   cervical  . TUBAL LIGATION      HEMATOLOGY/ONCOLOGY HISTORY:  Oncology History  Follicular lymphoma ([place])  08/06/2002 Initial Diagnosis   Follicular lymphoma ([place])  04/16/2020 -  Chemotherapy   The patient had palonosetron (ALOXI) injection 0.25 mg, 0.25 mg, Intravenous,  Once, 0 of 5 cycles obinutuzumab (GAZYVA) 1,000 mg in sodium chloride 0.9 % 250 mL (3.4483 mg/mL) chemo infusion, 1,000 mg, Intravenous, Once, 1 of 6 cycles Administration: 1,000 mg (04/16/2020), 1,000 mg (04/23/2020)  for chemotherapy treatment.    Follicular lymphoma grade I ([place])  11/29/2017 Initial Diagnosis   Follicular lymphoma grade I ([place])   04/07/2018 - 01/05/2019  Chemotherapy   The patient had riTUXimab (RITUXAN) 600 mg in sodium chloride 0.9 % 250 mL (1.9355 mg/mL) infusion, 375 mg/m2 = 600 mg, Intravenous,  Once, 3 of 8 cycles  for chemotherapy treatment.      ALLERGIES:  is allergic to lisinopril and primidone.  MEDICATIONS:  Current Facility-Administered Medications  Medication Dose Route Frequency Provider Last Rate Last Admin  . acetaminophen (TYLENOL) tablet 650 mg  650 mg Oral Q6H PRN Val Riles, MD   650 mg at 04/30/20 0119   Or  . acetaminophen (TYLENOL) suppository 650 mg  650 mg Rectal Q6H PRN Val Riles, MD      . acidophilus (RISAQUAD) capsule 1 capsule  1 capsule Oral Daily Val Riles, MD      . acyclovir (ZOVIRAX) 200 MG capsule 400 mg  400 mg Oral Daily Val Riles, MD      . bisacodyl (DULCOLAX) EC tablet 5 mg  5 mg Oral Daily Oretha Milch D, MD   5 mg at 04/29/20 0940  . lenalidomide (REVLIMID) capsule 5 mg  5 mg Oral Daily Val Riles, MD   5 mg at 04/29/20 1337  . morphine (MS CONTIN) 12 hr tablet 15 mg  15 mg Oral Q12H Val Riles, MD   15 mg at 04/29/20 2114  . morphine 2 MG/ML injection 1 mg  1 mg Intravenous Q3H PRN Oretha Milch D, MD   1 mg at 04/28/20 1453  . oxyCODONE (Oxy IR/ROXICODONE) immediate release tablet 5 mg  5 mg Oral Q6H PRN Val Riles, MD   5 mg at 04/30/20 0119  . pantoprazole (PROTONIX) EC tablet 40 mg  40 mg Oral Daily Val Riles, MD   40 mg at 04/27/20 1826  . polyethylene glycol (MIRALAX / GLYCOLAX) packet 17 g  17 g Oral Daily Val Riles, MD   17 g at 04/29/20 0940  . promethazine (PHENERGAN) tablet 12.5 mg  12.5 mg Oral Q6H PRN Val Riles, MD        VITAL SIGNS: BP (!) 151/66 (BP Location: Left Arm)   Pulse 62   Temp 98.6 F (37 C)   Resp 18   Ht _0  (1.676 m)   Wt 119 lb 13.1 oz (54.3 kg)   SpO2 98%   BMI 19.34 kg/m  Filed Weights   04/27/20 1816  Weight: 119 lb 13.1 oz (54.3 kg)    Estimated body mass index is 19.34 kg/m as calculated from the  following:   Height as of this encounter: _1  (1.676 m).   Weight as of this encounter: 119 lb 13.1 oz (54.3 kg).  LABS: CBC:    Component Value Date/Time   WBC 8.0 04/29/2020 0530   HGB 9.1 (L) 04/29/2020 0530   HGB 11.2 04/03/2020 0952   HCT 26.2 (L) 04/29/2020 0530   HCT 41.2 08/28/2014 1217   PLT 181 04/29/2020 0530   PLT 226 08/28/2014 1217   MCV 92.9 04/29/2020 0530   MCV 100 08/28/2014 1217   NEUTROABS 6.1 04/29/2020  0530   NEUTROABS 5.2 08/28/2014 1217   LYMPHSABS 0.2 (L) 04/29/2020 0530   LYMPHSABS 1.0 08/28/2014 1217   MONOABS 1.3 (H) 04/29/2020 0530   MONOABS 0.6 08/28/2014 1217   EOSABS 0.3 04/29/2020 0530   EOSABS 0.1 08/28/2014 1217   BASOSABS 0.1 04/29/2020 0530   BASOSABS 0.1 08/28/2014 1217   Comprehensive Metabolic Panel:    Component Value Date/Time   NA 131 (L) 04/29/2020 0530   NA 131 (L) 04/06/2020 1046   NA 125 (L) 11/26/2013 0954   K 4.6 04/29/2020 0530   K 3.2 (L) 11/26/2013 0954   CL 102 04/29/2020 0530   CL 91 (L) 11/26/2013 0954   CO2 24 04/29/2020 0530   CO2 29 11/26/2013 0954   BUN 14 04/29/2020 0530   BUN 29 (H) 04/06/2020 1046   BUN 10 11/26/2013 0954   CREATININE 0.60 04/29/2020 0530   CREATININE 0.70 11/26/2013 0954   GLUCOSE 101 (H) 04/29/2020 0530   GLUCOSE 96 11/26/2013 0954   CALCIUM 7.3 (L) 04/29/2020 0530   CALCIUM 8.5 11/26/2013 0954   AST 20 04/27/2020 1115   AST 35 11/26/2013 0954   ALT 21 04/27/2020 1115   ALT 29 11/26/2013 0954   ALKPHOS 118 04/27/2020 1115   ALKPHOS 61 11/26/2013 0954   BILITOT 0.8 04/27/2020 1115   BILITOT 0.4 11/26/2013 0954   PROT 5.4 (L) 04/27/2020 1115   PROT 6.0 (L) 11/26/2013 0954   ALBUMIN 3.1 (L) 04/27/2020 1115   ALBUMIN 3.3 (L) 11/26/2013 0954    RADIOGRAPHIC STUDIES: DG Chest 2 View  Result Date: 04/12/2020 CLINICAL DATA:  Shortness of breath and productive cough. EXAM: CHEST - 2 VIEW COMPARISON:  04/10/2020 FINDINGS: The cardiac silhouette, mediastinal and hilar contours are  within normal limits and stable. Chronic pulmonary scarring changes with emphysema and areas of bronchiectasis. Small right upper lobe patchy airspace opacity could reflect a small infiltrate or area of atelectasis. No pleural effusions or worrisome pulmonary lesions. The bony thorax is intact. IMPRESSION: 1. Chronic underlying lung changes. 2. Persistent patchy right upper lobe airspace opacity, small infiltrate or area of atelectasis. Electronically Signed   By: Marijo Sanes M.D.   On: 04/12/2020 15:39   DG Chest 2 View  Result Date: 04/10/2020 CLINICAL DATA:  86 year old female with increasing shortness of breath for 1 week. EXAM: CHEST - 2 VIEW COMPARISON:  Chest x-ray 11/26/2013. FINDINGS: Lung volumes are normal. Emphysematous changes are noted throughout the lungs. Ill-defined opacity in the right mid to upper lung, new compared to the prior examination. No acute consolidative airspace disease. No pleural effusions. No evidence of pulmonary edema. No pneumothorax. Heart size is normal. Upper mediastinal contours are within normal limits. IMPRESSION: 1. Ill-defined opacity in the right mid to upper lung which may reflect early bronchopneumonia. Followup PA and lateral chest X-ray is recommended in 3-4 weeks following trial of antibiotic therapy to ensure resolution and exclude underlying malignancy. 2. Emphysema. Electronically Signed   By: Vinnie Langton M.D.   On: 04/10/2020 20:44   NM Pulmonary Perfusion  Result Date: 04/10/2020 CLINICAL DATA:  Shortness of breath 1 week. EXAM: NUCLEAR MEDICINE PERFUSION LUNG SCAN TECHNIQUE: Perfusion images were obtained in multiple projections after intravenous injection of radiopharmaceutical. Ventilation scans intentionally deferred if perfusion scan and chest x-ray adequate for interpretation during COVID 19 epidemic. RADIOPHARMACEUTICALS:  4.35 mCi Tc-77mMAA IV COMPARISON:  Chest x-ray today. FINDINGS: Examination demonstrates evidence of patient's known  elevated left hemidiaphragm. Subtle nonsegmental streaky decreased uptake in  the perihilar regions likely due in part to atelectasis. Possible small peripheral perfusion defect over the left apex and right upper lobe. No other peripheral segmental perfusion defects. IMPRESSION: Very low probability for pulmonary embolism. Electronically Signed   By: Marin Olp M.D.   On: 04/10/2020 14:02   Ultrasound renal complete  Result Date: 04/07/2020 CLINICAL DATA:  Known right hydronephrosis. EXAM: RENAL / URINARY TRACT ULTRASOUND COMPLETE COMPARISON:  CT scan March 21, 2020 FINDINGS: Right Kidney: Renal measurements: 10.1 x 5.2 x 5.6 cm = volume: 152.9 mL. There is cortical thinning. Moderate to severe hydronephrosis is again identified. The nephroureteral stent is identified. Left Kidney: Renal measurements: 10 x 3.7 x 4.6 cm = volume: 88 mL. Echogenicity within normal limits. No mass or hydronephrosis visualized. Bladder: The nephroureteral stent is seen within the bladder. The bladder is poorly distended. Other: None. IMPRESSION: 1. Nephroureteral stent extending from the region of the right renal pelvis to the bladder. Continued moderate to severe right hydronephrosis and cortical thinning. Electronically Signed   By: Dorise Bullion III M.D   On: 04/07/2020 20:04   NM PET Image Restag (PS) Skull Base To Thigh  Result Date: 04/05/2020 CLINICAL DATA:  Subsequent treatment strategy for grade 1 lymphoma. Follicular lymphoma. EXAM: NUCLEAR MEDICINE PET SKULL BASE TO THIGH TECHNIQUE: 6.3 mCi F-18 FDG was injected intravenously. Full-ring PET imaging was performed from the skull base to thigh after the radiotracer. CT data was obtained and used for attenuation correction and anatomic localization. Fasting blood glucose: 108 mg/dl COMPARISON:  CT 03/21/2020, PET-CT 11/08/2018 FINDINGS: Mediastinal blood pool activity: SUV max 166 Liver activity: SUV max 227 NECK: No hypermetabolic lymph nodes in the neck. Incidental  CT findings: none CHEST: Cluster of lymph nodes in the prevascular space with hypermetabolic activity (SUV max equal 4.2). Activity greater than liver. Individually nodes measure up to 13 mm short axis (image 77/5). Incidental CT findings: Bronchiectasis in the LEFT upper lobe with some consolidation similar prior. Thickening along the LEFT superior oblique fissure is new but without significant metabolic activity (image 82/) ABDOMEN/PELVIS: Hypermetabolic periaortic retroperitoneal lymph nodes. The most intense lymph node position between the IVC and the aorta at the level the kidneys with SUV max equal 5.7. This node measures 22 cm (image 152/5). A similar lymph node LEFT aorta measuring 1.6 cm with SUV max equal 3.7. these retroperitoneal lymph nodes have metabolic activity greater than liver activity (SUV max equal 2.3). NO Clear hypermetabolic pelvic lymph nodes. No hypermetabolic inguinal nodes. Incidental CT findings: RIGHT ureteral stent in place with moderate hydronephrosis. Or renal excretion on the RIGHT compared to the LEFT SKELETON: No focal hypermetabolic activity to suggest skeletal metastasis. Incidental CT findings: none IMPRESSION: 1. Hypermetabolic prevascular lymph nodes new from comparison PET CT 11/08/2018 consistent with new lymphoma recurrence ([place]). 2. New hypermetabolic periaortic lymphadenopathy ([place] 5) 3. Normal spleen 4. Normal marrow. 5. Hydronephrosis of the RIGHT renal collecting system with stent in place. Electronically Signed   By: Suzy Bouchard M.D.   On: 04/05/2020 15:38   US Venous Img Lower Bilateral (DVT)  Result Date: 04/27/2020 CLINICAL DATA:  Persistent lower extremity swelling over the last 2 months. EXAM: Right LOWER EXTREMITY VENOUS DOPPLER ULTRASOUND TECHNIQUE: Gray-scale sonography with compression, as well as color and duplex ultrasound, were performed to evaluate the deep venous system(s) from the level of the common femoral vein through the  popliteal and proximal calf veins. COMPARISON:  None. FINDINGS: VENOUS Normal compressibility of the common femoral, superficial  femoral, and popliteal veins, as well as the visualized calf veins. Visualized portions of profunda femoral vein and great saphenous vein unremarkable. No filling defects to suggest DVT on grayscale or color Doppler imaging. Doppler waveforms show normal direction of venous flow, normal respiratory plasticity and response to augmentation. Limited views of the contralateral common femoral vein are unremarkable. OTHER Right Baker cyst measuring up to 4.5 cm. Limitations: none IMPRESSION: No evidence of deep venous thrombosis. Electronically Signed   By: Nelson Chimes M.D.   On: 04/27/2020 15:27   US Venous Img Lower Unilateral Right  Addendum Date: 04/10/2020   ADDENDUM REPORT: 04/10/2020 13:37 ADDENDUM: Incidentally noted approximately 3.4 x 0.8 x 2.9 cm anechoic fluid collection with the right popliteal fossa compatible with a Baker's cyst. Electronically Signed   By: Sandi Mariscal M.D.   On: 04/10/2020 13:37   Result Date: 04/10/2020 CLINICAL DATA:  Right lower extremity pain and edema. History of lymphoma. Evaluate for DVT. EXAM: RIGHT LOWER EXTREMITY VENOUS DOPPLER ULTRASOUND TECHNIQUE: Gray-scale sonography with graded compression, as well as color Doppler and duplex ultrasound were performed to evaluate the lower extremity deep venous systems from the level of the common femoral vein and including the common femoral, femoral, profunda femoral, popliteal and calf veins including the posterior tibial, peroneal and gastrocnemius veins when visible. The superficial great saphenous vein was also interrogated. Spectral Doppler was utilized to evaluate flow at rest and with distal augmentation maneuvers in the common femoral, femoral and popliteal veins. COMPARISON:  Right lower extremity venous Doppler ultrasound-03/14/2020. FINDINGS: Contralateral Common Femoral Vein: Respiratory  phasicity is normal and symmetric with the symptomatic side. No evidence of thrombus. Normal compressibility. Common Femoral Vein: No evidence of thrombus. Normal compressibility, respiratory phasicity and response to augmentation. Saphenofemoral Junction: No evidence of thrombus. Normal compressibility and flow on color Doppler imaging. Profunda Femoral Vein: No evidence of thrombus. Normal compressibility and flow on color Doppler imaging. Femoral Vein: No evidence of thrombus. Normal compressibility, respiratory phasicity and response to augmentation. Popliteal Vein: No evidence of thrombus. Normal compressibility, respiratory phasicity and response to augmentation. Calf Veins: No evidence of thrombus. Normal compressibility and flow on color Doppler imaging. Superficial Great Saphenous Vein: No evidence of thrombus. Normal compressibility. Venous Reflux:  None. Other Findings:  None. IMPRESSION: No evidence of DVT within the right lower extremity. Electronically Signed: By: Sandi Mariscal M.D. On: 04/10/2020 12:40   IR NEPHROSTOMY PLACEMENT RIGHT  Result Date: 04/13/2020 INDICATION: 87 year old female with persisting hydronephrosis after stent placement EXAM: IMAGE GUIDED PLACEMENT OF RIGHT PERCUTANEOUS NEPHROSTOMY COMPARISON:  None. MEDICATIONS: NONE ANESTHESIA/SEDATION: Fentanyl 0.5 mcg IV; Versed 25 mg IV Moderate Sedation Time:  15 minutes The patient was continuously monitored during the procedure by the interventional radiology nurse under my direct supervision. CONTRAST:  10 cc-administered into the collecting system(s) FLUOROSCOPY TIME:  Fluoroscopy Time: 0 minutes 48 seconds (3.7 mGy). COMPLICATIONS: None PROCEDURE: Informed written consent was obtained from the patient after a thorough discussion of the procedural risks, benefits and alternatives. All questions were addressed. Maximal Sterile Barrier Technique was utilized including caps, mask, sterile gowns, sterile gloves, sterile drape, hand  hygiene and skin antiseptic. A timeout was performed prior to the initiation of the procedure. Patient positioned prone position on the fluoroscopy table. Ultrasound survey of the right flank was performed with images stored and sent to PACs. The patient was then prepped and draped in the usual sterile fashion. 1% lidocaine was used to anesthetize the skin and subcutaneous tissues for local  anesthesia. A Chiba needle was then used to access a posterior inferior calyx with ultrasound guidance. With spontaneous urine returned through the needle, passage of an 018 micro wire into the collecting system was performed under fluoroscopy. A small incision was made with an 11 blade scalpel, and the needle was removed from the wire. An Accustick system was then advanced over the wire into the collecting system under fluoroscopy. The metal stiffener and inner dilator were removed, and then a sample of fluid was aspirated through the 4 French outer sheath. Bentson wire was passed into the collecting system and the sheath removed. Ten French dilation of the soft tissues was performed. Using modified Seldinger technique, a 10 French pigtail catheter drain was placed over the Bentson wire. Wire and inner stiffener removed, and the pigtail was formed in the collecting system. Small amount of contrast confirmed position of the catheter. Patient tolerated the procedure well and remained hemodynamically stable throughout. No complications were encountered and no significant blood loss encountered IMPRESSION: Status post right-sided percutaneous nephrostomy. Signed, Dulcy Fanny. Dellia Nims, RPVI [facility] Electronically Signed   By: Corrie Mckusick D.O.   On: 04/13/2020 12:14    PERFORMANCE STATUS (ECOG) : 2 - Symptomatic, <50% confined to bed  Review of Systems Unless otherwise noted, a complete review of systems is negative.  Physical Exam General: NAD, frail  appearing Pulmonary: Unlabored Extremities: no edema, no joint deformities Skin: no rashes Neurological: Weakness but otherwise nonfocal  IMPRESSION: I met with patient and spoke with her husband by phone.  Patient says that she has decided to forego future cancer treatments and instead wants to focus only on comfort and quality of life, even if it entails a shortened life expectancy.  She is interested in hospice involvement at home.  She says that she would also be interested in possible residential hospice when clinically appropriate.  I spoke with the hospice liaison who will also meet with the family today.  Symptomatically, patient says that she is more comfortable this morning.  Abdominal pain is likely from her peritoneal adenopathy.  She says that she was somewhat sleepy after starting the MS Contin but this has improved over the past few days.  Would recommend continuation of the MS Contin with use of oxycodone as needed for breakthrough pain.  Continue daily bowel regimen to prevent opioid-induced constipation.  PLAN: -Best supportive care -Hospice referral -Continue MS Contin/oxycodone -Bowel regimen -DNR -RTC as needed   Patient expressed understanding and was in agreement with this plan. She also understands that She can call the clinic at any time with any questions, concerns, or complaints.     Time Total: 60 minutes  Visit consisted of counseling and education dealing with the complex and emotionally intense issues of symptom management and palliative care in the setting of serious and potentially life-threatening illness.Greater than 50%  of this time was spent counseling and coordinating care related to the above assessment and plan.  Signed by: Altha Harm, PhD, NP-C

## 2020-04-30 NOTE — TOC Progression Note (Signed)
Transition of Care [facility]) - Progression Note    Patient Details  Name: [name] MRN: [id number] Date of Birth: Jul 03, 1932  Transition of Care [facility] CM/SW Contact  Ebbie Cherry, Gardiner Rhyme, LCSW Phone Number: 04/30/2020, 9:41 AM  Clinical Narrative:  Met with MD who has had lengthy conversation with pt and husband who wants hospice at home, [facility] at [facility] and have the option to go to the [facility] if later on want this. Have messaged [facility] regarding referral. MD to put order in chart. Will follow up with [facility].         Expected Discharge Plan and Services                                                 Social Determinants of Health (SDOH) Interventions    Readmission Risk Interventions No flowsheet data found.

## 2020-04-30 NOTE — NC FL2 (Signed)
[place] LEVEL OF CARE SCREENING TOOL     IDENTIFICATION  Patient Name: [name] Birthdate: [birth year] Sex: female Admission Date (Current Location): 04/27/2020  [place] and [place] Number:  Engineering geologist and Address:  [facility], [address], [place], [place] [address]      Provider Number: [id number]  Attending Physician Name and Address:  Desiree Hane, MD  Relative Name and Phone Number:  Sherlene Talbott  husband [phone]    Current Level of Care: Hospital Recommended Level of Care: [place] Prior Approval Number:    Date Approved/Denied:   PASRR Number: [id number]  Discharge Plan: SNF    Current Diagnoses: Patient Active Problem List   Diagnosis Date Noted  . Palliative care encounter   . Chronic hyponatremia 04/28/2020  . Cancer-related pain 04/22/2020  . Opacity of lung on imaging study 04/16/2020  . Edema of right lower extremity 04/09/2020  . Dysphagia 04/09/2020  . Right-sided abdominal pain of unknown cause 03/25/2020  . Swelling of right lower extremity 03/25/2020  . Renal insufficiency 03/25/2020  . Hydronephrosis of right kidney 03/22/2020  . Heart palpitations 11/23/2018  . Tongue lesion 11/08/2018  . Osteoporosis without current pathological fracture 11/08/2018  . Fatigue 11/08/2018  . Goals of care, counseling/discussion 12/24/2017  . Encounter for antineoplastic immunotherapy 12/24/2017  . 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 QT prolongation 03/23/2017  . Lymphedema 09/22/2016  . Headache, migraine 04/01/2016  . Auditory vertigo 04/01/2016  . Osteoporosis, post-menopausal 04/01/2016  . CA skin, basal cell 04/01/2016  . Hemorrhoid 04/01/2016  . Hypercholesterolemia 03/06/2016  . 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 Personal history of other specified conditions 12/27/2015  . Bacterial infection due to E. coli 08/15/2015  . Injury of  kidney 08/15/2015  . Decreased body weight 09/16/2013  . Benign essential tremor 03/16/2013  . Breast calcification seen on mammogram 12/28/2012  . Retina disorder 09/30/2012  . 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 Has a tremor 09/14/2012  . Acid reflux 09/19/2011  . Avitaminosis D 09/19/2011  . Non-Hodgkin's lymphoma ([place]) 09/19/2011  . Follicular lymphoma ([place]) 08/06/2002    Orientation RESPIRATION BLADDER Height & Weight     Self, Situation, Place  Normal Continent Weight: 119 lb 13.1 oz (54.3 kg) Height:  5\' 6"  (167.6 cm)  BEHAVIORAL SYMPTOMS/MOOD NEUROLOGICAL BOWEL NUTRITION STATUS      Continent Diet(Dys 3 thin liquids)  AMBULATORY STATUS COMMUNICATION OF NEEDS Skin   Limited Assist Verbally Normal                       Personal Care Assistance Level of Assistance  Bathing, Dressing Bathing Assistance: Limited assistance   Dressing Assistance: Limited assistance     Functional Limitations Info             SPECIAL CARE FACTORS FREQUENCY                       Contractures Contractures Info: Not present    Additional Factors Info  Code Status, Allergies Code Status Info: DNR Allergies Info: lisinopril, Primidone           Current Medications (04/30/2020):  This is the current hospital active medication list Current Facility-Administered Medications  Medication Dose Route Frequency Provider Last Rate Last Admin  . acetaminophen (TYLENOL) tablet 650 mg  650 mg Oral Q6H PRN Val Riles, MD   650 mg at 04/30/20 0119  Or  . acetaminophen (TYLENOL) suppository 650 mg  650 mg Rectal Q6H PRN Val Riles, MD      . acidophilus (RISAQUAD) capsule 1 capsule  1 capsule Oral Daily Val Riles, MD      . acyclovir (ZOVIRAX) 200 MG capsule 400 mg  400 mg Oral Daily Val Riles, MD      . bisacodyl (DULCOLAX) EC tablet 5 mg  5 mg Oral Daily Oretha Milch D, MD   5 mg at 04/29/20 0940  . lenalidomide (REVLIMID) capsule 5 mg  5 mg Oral Daily Val Riles, MD    5 mg at 04/30/20 1123  . morphine (MS CONTIN) 12 hr tablet 15 mg  15 mg Oral Q12H Val Riles, MD   15 mg at 04/30/20 1128  . morphine 2 MG/ML injection 1 mg  1 mg Intravenous Q3H PRN Oretha Milch D, MD   1 mg at 04/28/20 1453  . oxyCODONE (Oxy IR/ROXICODONE) immediate release tablet 5 mg  5 mg Oral Q6H PRN Val Riles, MD   5 mg at 04/30/20 0119  . pantoprazole (PROTONIX) EC tablet 40 mg  40 mg Oral Daily Val Riles, MD   40 mg at 04/27/20 1826  . polyethylene glycol (MIRALAX / GLYCOLAX) packet 17 g  17 g Oral Daily Val Riles, MD   17 g at 04/29/20 0940  . promethazine (PHENERGAN) tablet 12.5 mg  12.5 mg Oral Q6H PRN Val Riles, MD         Discharge Medications: Please see discharge summary for a list of discharge medications.  Relevant Imaging Results:  Relevant Lab Results:   Additional Information SSN: [id number]  Sriram Febles, Gardiner Rhyme, LCSW

## 2020-04-30 NOTE — Progress Notes (Signed)
[facility]  PROGRESS NOTE  [name] [id number] DOB: 02/24/1932 DOA: 04/27/2020 PCP: Marinda Elk, MD Admit date - 04/27/2020   Admitting Physician Val Riles, MD  Outpatient Primary MD for the patient is Marinda Elk, MD  LOS - 3 Brief Narrative   [name] is a 86 y.o. year old female with medical history significant for stage III follicular non-Hodgkin's lymphoma chronic lower extremity edema bilaterally, right-sided hydronephrosis status post external stent, chronic pain secondary to cancer, chronic hyponatremia who presented on 04/27/2020 with progressive diminished oral intake, worsening confusion and was found to have failure to thrive related to ongoing adenopathy from her known cancer.  Patient was a direct admission from her oncologist office    Subjective  Today patient continues to report stable control of her pain.  Denies any chest pain, no shortness of breath.  A & P    Stage III non- Hodgkin's lymphoma complicated by right-sided hydronephrosis status post external stent placement.  Followed by Dr. Patsy [place] as outpatient -Patient does not want to continue further treatment for her lymphoma, discussed with her oncologist during hospitalization -Patient family confirm wanting to pursue hospice care in conversation with hospitalist team and palliative medicine -Patient will be discharged home with hospice of [facility], DNR signed in hospital  Failure to thrive.  Diminished oral intake, dysphagia like related to upper neck adenopathy.  Patient declines further GI evaluation -Would like to pursue comfort care/hospice measures -Liberalize diet to dysphagia 3  Acute on chronic hyponatremia, in setting of diminished oral intake due to above.  Nadir of 124, improved to 132 -Discontinue IV fluids -Continue to encourage oral intake as tolerated -No further lab draws  Cancer-related pain, stable -Continue home MS Contin -Oxycodone  for breakthrough pain -Continue bowel regimen  Right-sided hydronephrosis, status post stent placement, stable.  Nephrostomy tube draining clear yellow urine.  Bilateral lower extremity edema, right side greater than left.  Venous duplex with no acute changes.  In the setting of obstructive adenopathy -Closely monitor, wound care for weeping of skin/bullae.  Goals of care discussion.  Extensive discussion with husband and patient regarding poor quality of life on 5/15.  Patient also discussed with her oncologist she does not want to pursue further treatment for lymphoma and is interested in hospice care.       Family Communication  : Husband,updated at bedside  Code Status : DNR  Disposition Plan  :  Patient is from home. Anticipated d/c date:  24  hours. Barriers to d/c or necessity for inpatient status:  Home with hospice to be arranged by 5/18 Consults  : Oncology, palliative  Procedures  : Venous duplex, 5/14  DVT Prophylaxis  :  Lovenox -   Lab Results  Component Value Date   PLT 181 04/29/2020    Diet :  Diet Order            DIET DYS 3 Room service appropriate? Yes with Assist; Fluid consistency: Thin  Diet effective now               Inpatient Medications Scheduled Meds:  acidophilus  1 capsule Oral Daily   acyclovir  400 mg Oral Daily   bisacodyl  5 mg Oral Daily   lenalidomide  5 mg Oral Daily   morphine  15 mg Oral Q12H   pantoprazole  40 mg Oral Daily   polyethylene glycol  17 g Oral Daily   Continuous Infusions:  PRN Meds:.acetaminophen **OR** acetaminophen, morphine  injection, oxyCODONE, promethazine  Antibiotics  :   Anti-infectives (From admission, onward)   Start     Dose/Rate Route Frequency Ordered Stop   04/27/20 1800  acyclovir (ZOVIRAX) 200 MG capsule 400 mg     400 mg Oral Daily 04/27/20 1757     04/27/20 1745  acyclovir (ZOVIRAX) tablet 400 mg  Status:  Discontinued     400 mg Oral Daily 04/27/20 1738 04/27/20 1757        Objective   Vitals:   04/29/20 1716 04/29/20 2240 04/30/20 0755 04/30/20 1717  BP: (!) 157/84 (!) 159/66 (!) 151/66 131/72  Pulse: 79 82 62 84  Resp: 19 16 18    Temp: 98.5 F (36.9 C) 98.7 F (37.1 C) 98.6 F (37 C) 99 F (37.2 C)  TempSrc:  Oral  Oral  SpO2: 95% 97% 98% 99%  Weight:      Height:        SpO2: 99 %  Wt Readings from Last 3 Encounters:  04/27/20 54.3 kg  04/27/20 54.3 kg  04/23/20 53.8 kg     Intake/Output Summary (Last 24 hours) at 04/30/2020 1732 Last data filed at 04/30/2020 1029 Gross per 24 hour  Intake 240 ml  Output 250 ml  Net -10 ml    Physical Exam:  Elderly thin and frail female,  no distress Awake Alert, Oriented X 3, Normal affect No new F.N deficits,  [place].AT, Normal respiratory effort on room air, Nephrostomy tube in place on right side draining clear yellow urine No Cyanosis, No new Rash or bruise     I have personally reviewed the following:   Data Reviewed:  CBC Recent Labs  Lab 04/27/20 1115 04/28/20 0458 04/29/20 0530  WBC 9.0 7.3 8.0  HGB 9.7* 9.2* 9.1*  HCT 28.5* 27.2* 26.2*  PLT 186 200 181  MCV 94.4 94.4 92.9  MCH 32.1 31.9 32.3  MCHC 34.0 33.8 34.7  RDW 13.2 13.0 13.4  LYMPHSABS 0.1* 0.2* 0.2*  MONOABS 0.8 1.3* 1.3*  EOSABS 0.0 0.2 0.3  BASOSABS 0.0 0.0 0.1    Chemistries  Recent Labs  Lab 04/27/20 1115 04/28/20 0458 04/29/20 0530  NA 124* 129* 131*  K 3.4* 3.8 4.6  CL 92* 98 102  CO2 24 25 24   GLUCOSE 119* 100* 101*  BUN 32* 23 14  CREATININE 0.83 0.78 0.60  CALCIUM 8.0* 7.5* 7.3*  MG 2.0 2.1  --   AST 20  --   --   ALT 21  --   --   ALKPHOS 118  --   --   BILITOT 0.8  --   --    ------------------------------------------------------------------------------------------------------------------ No results for input(s): CHOL, HDL, LDLCALC, TRIG, CHOLHDL, LDLDIRECT in the last 72 hours.  No results found for:  HGBA1C ------------------------------------------------------------------------------------------------------------------ No results for input(s): TSH, T4TOTAL, T3FREE, THYROIDAB in the last 72 hours.  Invalid input(s): FREET3 ------------------------------------------------------------------------------------------------------------------ No results for input(s): VITAMINB12, FOLATE, FERRITIN, TIBC, IRON, RETICCTPCT in the last 72 hours.  Coagulation profile No results for input(s): INR, PROTIME in the last 168 hours.  No results for input(s): DDIMER in the last 72 hours.  Cardiac Enzymes No results for input(s): CKMB, TROPONINI, MYOGLOBIN in the last 168 hours.  Invalid input(s): CK ------------------------------------------------------------------------------------------------------------------ No results found for: BNP  Micro Results Recent Results (from the past 240 hour(s))  SARS Coronavirus 2 by RT PCR (hospital order, performed in [facility] hospital lab) Nasopharyngeal Nasopharyngeal Swab     Status: None   Collection Time:  04/27/20  5:11 PM   Specimen: Nasopharyngeal Swab  Result Value Ref Range Status   SARS Coronavirus 2 NEGATIVE NEGATIVE Final    Comment: (NOTE) SARS-CoV-2 target nucleic acids are NOT DETECTED. The SARS-CoV-2 RNA is generally detectable in upper and lower respiratory specimens during the acute phase of infection. The lowest concentration of SARS-CoV-2 viral copies this assay can detect is 250 copies / mL. A negative result does not preclude SARS-CoV-2 infection and should not be used as the sole basis for treatment or other patient management decisions.  A negative result may occur with improper specimen collection / handling, submission of specimen other than nasopharyngeal swab, presence of viral mutation(s) within the areas targeted by this assay, and inadequate number of viral copies (<250 copies / mL). A negative result must be combined with  clinical observations, patient history, and epidemiological information. Fact Sheet for Patients:   StrictlyIdeas.no Fact Sheet for Healthcare Providers: BankingDealers.co.za This test is not yet approved or cleared  by the [place] FDA and has been authorized for detection and/or diagnosis of SARS-CoV-2 by FDA under an Emergency Use Authorization (EUA).  This EUA will remain in effect (meaning this test can be used) for the duration of the COVID-19 declaration under Section 564(b)(1) of the Act, 21 U.S.C. section 360bbb-3(b)(1), unless the authorization is terminated or revoked sooner. Performed at [facility], [address] [place], [place] [address]     Radiology Reports DG Chest 2 View  Result Date: 04/12/2020 CLINICAL DATA:  Shortness of breath and productive cough. EXAM: CHEST - 2 VIEW COMPARISON:  04/10/2020 FINDINGS: The cardiac silhouette, mediastinal and hilar contours are within normal limits and stable. Chronic pulmonary scarring changes with emphysema and areas of bronchiectasis. Small right upper lobe patchy airspace opacity could reflect a small infiltrate or area of atelectasis. No pleural effusions or worrisome pulmonary lesions. The bony thorax is intact. IMPRESSION: 1. Chronic underlying lung changes. 2. Persistent patchy right upper lobe airspace opacity, small infiltrate or area of atelectasis. Electronically Signed   By: Marijo Sanes M.D.   On: 04/12/2020 15:39   DG Chest 2 View  Result Date: 04/10/2020 CLINICAL DATA:  86 year old female with increasing shortness of breath for 1 week. EXAM: CHEST - 2 VIEW COMPARISON:  Chest x-ray 11/26/2013. FINDINGS: Lung volumes are normal. Emphysematous changes are noted throughout the lungs. Ill-defined opacity in the right mid to upper lung, new compared to the prior examination. No acute consolidative airspace disease. No pleural effusions. No evidence of pulmonary  edema. No pneumothorax. Heart size is normal. Upper mediastinal contours are within normal limits. IMPRESSION: 1. Ill-defined opacity in the right mid to upper lung which may reflect early bronchopneumonia. Followup PA and lateral chest X-ray is recommended in 3-4 weeks following trial of antibiotic therapy to ensure resolution and exclude underlying malignancy. 2. Emphysema. Electronically Signed   By: Vinnie Langton M.D.   On: 04/10/2020 20:44   NM Pulmonary Perfusion  Result Date: 04/10/2020 CLINICAL DATA:  Shortness of breath 1 week. EXAM: NUCLEAR MEDICINE PERFUSION LUNG SCAN TECHNIQUE: Perfusion images were obtained in multiple projections after intravenous injection of radiopharmaceutical. Ventilation scans intentionally deferred if perfusion scan and chest x-ray adequate for interpretation during COVID 19 epidemic. RADIOPHARMACEUTICALS:  4.35 mCi Tc-1m MAA IV COMPARISON:  Chest x-ray today. FINDINGS: Examination demonstrates evidence of patient's known elevated left hemidiaphragm. Subtle nonsegmental streaky decreased uptake in the perihilar regions likely due in part to atelectasis. Possible small peripheral perfusion defect over the left  apex and right upper lobe. No other peripheral segmental perfusion defects. IMPRESSION: Very low probability for pulmonary embolism. Electronically Signed   By: Marin Olp M.D.   On: 04/10/2020 14:02   Ultrasound renal complete  Result Date: 04/07/2020 CLINICAL DATA:  Known right hydronephrosis. EXAM: RENAL / URINARY TRACT ULTRASOUND COMPLETE COMPARISON:  CT scan March 21, 2020 FINDINGS: Right Kidney: Renal measurements: 10.1 x 5.2 x 5.6 cm = volume: 152.9 mL. There is cortical thinning. Moderate to severe hydronephrosis is again identified. The nephroureteral stent is identified. Left Kidney: Renal measurements: 10 x 3.7 x 4.6 cm = volume: 88 mL. Echogenicity within normal limits. No mass or hydronephrosis visualized. Bladder: The nephroureteral stent is seen  within the bladder. The bladder is poorly distended. Other: None. IMPRESSION: 1. Nephroureteral stent extending from the region of the right renal pelvis to the bladder. Continued moderate to severe right hydronephrosis and cortical thinning. Electronically Signed   By: Dorise Bullion III M.D   On: 04/07/2020 20:04   NM PET Image Restag (PS) Skull Base To Thigh  Result Date: 04/05/2020 CLINICAL DATA:  Subsequent treatment strategy for grade 1 lymphoma. Follicular lymphoma. EXAM: NUCLEAR MEDICINE PET SKULL BASE TO THIGH TECHNIQUE: 6.3 mCi F-18 FDG was injected intravenously. Full-ring PET imaging was performed from the skull base to thigh after the radiotracer. CT data was obtained and used for attenuation correction and anatomic localization. Fasting blood glucose: 108 mg/dl COMPARISON:  CT 03/21/2020, PET-CT 11/08/2018 FINDINGS: Mediastinal blood pool activity: SUV max 166 Liver activity: SUV max 227 NECK: No hypermetabolic lymph nodes in the neck. Incidental CT findings: none CHEST: Cluster of lymph nodes in the prevascular space with hypermetabolic activity (SUV max equal 4.2). Activity greater than liver. Individually nodes measure up to 13 mm short axis (image 77/5). Incidental CT findings: Bronchiectasis in the LEFT upper lobe with some consolidation similar prior. Thickening along the LEFT superior oblique fissure is new but without significant metabolic activity (image [id number]) ABDOMEN/PELVIS: Hypermetabolic periaortic retroperitoneal lymph nodes. The most intense lymph node position between the IVC and the aorta at the level the kidneys with SUV max equal 5.7. This node measures 22 cm (image 152/5). A similar lymph node LEFT aorta measuring 1.6 cm with SUV max equal 3.7. these retroperitoneal lymph nodes have metabolic activity greater than liver activity (SUV max equal 2.3). NO Clear hypermetabolic pelvic lymph nodes. No hypermetabolic inguinal nodes. Incidental CT findings: RIGHT ureteral stent in place  with moderate hydronephrosis. Or renal excretion on the RIGHT compared to the LEFT SKELETON: No focal hypermetabolic activity to suggest skeletal metastasis. Incidental CT findings: none IMPRESSION: 1. Hypermetabolic prevascular lymph nodes new from comparison PET CT 11/08/2018 consistent with new lymphoma recurrence ([place] 5). 2. New hypermetabolic periaortic lymphadenopathy ([place] 5) 3. Normal spleen 4. Normal marrow. 5. Hydronephrosis of the RIGHT renal collecting system with stent in place. Electronically Signed   By: Suzy Bouchard M.D.   On: 04/05/2020 15:38   US Venous Img Lower Bilateral (DVT)  Result Date: 04/27/2020 CLINICAL DATA:  Persistent lower extremity swelling over the last 2 months. EXAM: Right LOWER EXTREMITY VENOUS DOPPLER ULTRASOUND TECHNIQUE: Gray-scale sonography with compression, as well as color and duplex ultrasound, were performed to evaluate the deep venous system(s) from the level of the common femoral vein through the popliteal and proximal calf veins. COMPARISON:  None. FINDINGS: VENOUS Normal compressibility of the common femoral, superficial femoral, and popliteal veins, as well as the visualized calf veins. Visualized portions of profunda femoral vein  and great saphenous vein unremarkable. No filling defects to suggest DVT on grayscale or color Doppler imaging. Doppler waveforms show normal direction of venous flow, normal respiratory plasticity and response to augmentation. Limited views of the contralateral common femoral vein are unremarkable. OTHER Right Baker cyst measuring up to 4.5 cm. Limitations: none IMPRESSION: No evidence of deep venous thrombosis. Electronically Signed   By: Nelson Chimes M.D.   On: 04/27/2020 15:27   US Venous Img Lower Unilateral Right  Addendum Date: 04/10/2020   ADDENDUM REPORT: 04/10/2020 13:37 ADDENDUM: Incidentally noted approximately 3.4 x 0.8 x 2.9 cm anechoic fluid collection with the right popliteal fossa compatible with a  Baker's cyst. Electronically Signed   By: Sandi Mariscal M.D.   On: 04/10/2020 13:37   Result Date: 04/10/2020 CLINICAL DATA:  Right lower extremity pain and edema. History of lymphoma. Evaluate for DVT. EXAM: RIGHT LOWER EXTREMITY VENOUS DOPPLER ULTRASOUND TECHNIQUE: Gray-scale sonography with graded compression, as well as color Doppler and duplex ultrasound were performed to evaluate the lower extremity deep venous systems from the level of the common femoral vein and including the common femoral, femoral, profunda femoral, popliteal and calf veins including the posterior tibial, peroneal and gastrocnemius veins when visible. The superficial great saphenous vein was also interrogated. Spectral Doppler was utilized to evaluate flow at rest and with distal augmentation maneuvers in the common femoral, femoral and popliteal veins. COMPARISON:  Right lower extremity venous Doppler ultrasound-03/14/2020. FINDINGS: Contralateral Common Femoral Vein: Respiratory phasicity is normal and symmetric with the symptomatic side. No evidence of thrombus. Normal compressibility. Common Femoral Vein: No evidence of thrombus. Normal compressibility, respiratory phasicity and response to augmentation. Saphenofemoral Junction: No evidence of thrombus. Normal compressibility and flow on color Doppler imaging. Profunda Femoral Vein: No evidence of thrombus. Normal compressibility and flow on color Doppler imaging. Femoral Vein: No evidence of thrombus. Normal compressibility, respiratory phasicity and response to augmentation. Popliteal Vein: No evidence of thrombus. Normal compressibility, respiratory phasicity and response to augmentation. Calf Veins: No evidence of thrombus. Normal compressibility and flow on color Doppler imaging. Superficial Great Saphenous Vein: No evidence of thrombus. Normal compressibility. Venous Reflux:  None. Other Findings:  None. IMPRESSION: No evidence of DVT within the right lower extremity.  Electronically Signed: By: Sandi Mariscal M.D. On: 04/10/2020 12:40   IR NEPHROSTOMY PLACEMENT RIGHT  Result Date: 04/13/2020 INDICATION: 86 year old female with persisting hydronephrosis after stent placement EXAM: IMAGE GUIDED PLACEMENT OF RIGHT PERCUTANEOUS NEPHROSTOMY COMPARISON:  None. MEDICATIONS: NONE ANESTHESIA/SEDATION: Fentanyl 0.5 mcg IV; Versed 25 mg IV Moderate Sedation Time:  15 minutes The patient was continuously monitored during the procedure by the interventional radiology nurse under my direct supervision. CONTRAST:  10 cc-administered into the collecting system(s) FLUOROSCOPY TIME:  Fluoroscopy Time: 0 minutes 48 seconds (3.7 mGy). COMPLICATIONS: None PROCEDURE: Informed written consent was obtained from the patient after a thorough discussion of the procedural risks, benefits and alternatives. All questions were addressed. Maximal Sterile Barrier Technique was utilized including caps, mask, sterile gowns, sterile gloves, sterile drape, hand hygiene and skin antiseptic. A timeout was performed prior to the initiation of the procedure. Patient positioned prone position on the fluoroscopy table. Ultrasound survey of the right flank was performed with images stored and sent to PACs. The patient was then prepped and draped in the usual sterile fashion. 1% lidocaine was used to anesthetize the skin and subcutaneous tissues for local anesthesia. A Chiba needle was then used to access a posterior inferior calyx with ultrasound guidance. With  spontaneous urine returned through the needle, passage of an 018 micro wire into the collecting system was performed under fluoroscopy. A small incision was made with an 11 blade scalpel, and the needle was removed from the wire. An Accustick system was then advanced over the wire into the collecting system under fluoroscopy. The metal stiffener and inner dilator were removed, and then a sample of fluid was aspirated through the 4 French outer sheath. Bentson wire  was passed into the collecting system and the sheath removed. Ten French dilation of the soft tissues was performed. Using modified Seldinger technique, a 10 French pigtail catheter drain was placed over the Bentson wire. Wire and inner stiffener removed, and the pigtail was formed in the collecting system. Small amount of contrast confirmed position of the catheter. Patient tolerated the procedure well and remained hemodynamically stable throughout. No complications were encountered and no significant blood loss encountered IMPRESSION: Status post right-sided percutaneous nephrostomy. Signed, Dulcy Fanny. Dellia Nims, RPVI [facility] Radiology Electronically Signed   By: Corrie Mckusick D.O.   On: 04/13/2020 12:14     Time Spent in minutes  30     Desiree Hane M.D on 04/30/2020 at 5:32 PM  To page go to [email or web address] - password [facility]

## 2020-05-01 ENCOUNTER — Encounter
Admission: RE | Admit: 2020-05-01 | Discharge: 2020-05-01 | Disposition: A | Discharge status: Home / Self Care | Payer: Medicare Other | Source: Ambulatory Visit | Attending: Internal Medicine | Admitting: Internal Medicine

## 2020-05-01 LAB — SARS CORONAVIRUS 2 (TAT 6-24 HRS): SARS Coronavirus 2: NEGATIVE

## 2020-05-01 MED ORDER — OXYCODONE HCL 5 MG PO TABS: ORAL_TABLET | ORAL | 0 refills | Status: AC

## 2020-05-01 NOTE — Discharge Summary (Signed)
[name] [id number] DOB: 1932/09/19 DOA: 04/27/2020  PCP: Marinda Elk, MD  Admit date: 04/27/2020 Discharge date: 05/01/2020  Admitted From: [facility] Disposition:  [facility] with home hospice    Discharge Condition:STABLE  CODE STATUS:DNR    Brief/Interim Summary: History of present illness:  [name] is a 86 y.o. year old female with medical history significant for stage III follicular non-Hodgkin's lymphoma chronic lower extremity edema bilaterally, right-sided hydronephrosis status post external stent, chronic pain secondary to cancer, chronic hyponatremia who presented on 04/27/2020 with progressive diminished oral intake, worsening confusion and was found to have failure to thrive related to ongoing adenopathy from her known cancer.  Patient was a direct admission from her oncologist officeRemaining hospital course addressed in problem based format below:   Hospital Course:   Stage III non- Hodgkin's lymphoma complicated by right-sided hydronephrosis status post external stent placement.  Followed by Dr. Patsy Baltimore as outpatient -Patient does not want to continue further treatment for her lymphoma, discussed with her oncologist during hospitalization -Patient family confirm wanting to pursue hospice care in conversation with hospitalist team and palliative medicine team -Patient will be discharged home with hospice of [facility], DNR signed in hospital  Failure to thrive.  Diminished oral intake, dysphagia like related to upper neck adenopathy.  Patient declines further GI evaluation -Would like to pursue comfort care/hospice measures -Liberalize diet to dysphagia 3  Acute on chronic hyponatremia, in setting of diminished oral intake due to above.  Nadir of 124, improved to 132 on IVF.  -Continue to encourage oral intake as tolerated -No further lab draws  Cancer-related pain, stable -Continue home MS Contin -Oxycodone  for breakthrough pain, script provided on discharge -Continue bowel regimen  Right-sided hydronephrosis, status post stent placement, stable.  Nephrostomy tube draining clear yellow urine.  Bilateral lower extremity edema, right side greater than left.  Venous duplex with no acute changes.  In the setting of obstructive adenopathy -Closely monitor, wound care for weeping of skin/bullae.  Goals of care discussion.  Extensive discussion with husband and patient regarding poor quality of life on 5/15.  Patient also discussed with her oncologist she does not want to pursue further treatment for lymphoma and is interested in hospice care.       Consultations:  Palliativ, oncology  Procedures/Studies: none Subjective: Feels pain is well controlled. Ready to go home Discharge Exam: Vitals:   04/30/20 2341 05/01/20 0752  BP: (!) 146/70 (!) 162/69  Pulse: 86 82  Resp: 18 18  Temp: 98.6 F (37 C) 98.8 F (37.1 C)  SpO2: 97% 99%   Vitals:   04/30/20 0755 04/30/20 1717 04/30/20 2341 05/01/20 0752  BP: (!) 151/66 131/72 (!) 146/70 (!) 162/69  Pulse: 62 84 86 82  Resp: 18  18 18   Temp: 98.6 F (37 C) 99 F (37.2 C) 98.6 F (37 C) 98.8 F (37.1 C)  TempSrc:  Oral  Oral  SpO2: 98% 99% 97% 99%  Weight:      Height:        Elderly thin and frail female,  no distress Awake Alert, Oriented X 3, Normal affect No new F.N deficits,  [place].AT, Normal respiratory effort on room air, Nephrostomy tube in place on right side draining clear yellow urine No Cyanosis, No new Rash or bruise   Discharge Diagnoses:  Active Problems:   Follicular lymphoma (HCC)   Non-Hodgkin's lymphoma (HCC)   Goals of care, counseling/discussion   Hydronephrosis of right  kidney   Dysphagia   Cancer-related pain   Chronic hyponatremia   Palliative care encounter    Discharge Instructions  Discharge Instructions    Diet - low sodium heart healthy   Complete by: As directed    Increase activity  slowly   Complete by: As directed      Allergies as of 05/01/2020      Reactions   Lisinopril Cough   Primidone Other (See Comments)   Makes her feel "loopy and unsteady" Also, did not help the tremors      Medication List    STOP taking these medications   lenalidomide 5 MG capsule Commonly known as: REVLIMID     TAKE these medications   acyclovir 400 MG tablet Commonly known as: ZOVIRAX Take 1 tablet (400 mg total) by mouth daily.   allopurinol 100 MG tablet Commonly known as: Zyloprim Take 1 tablet (100 mg total) by mouth daily.   AZELASTINE & FLUTICASONE NA 1 spray each nase twice daily.   Calcium 500/D 500-200 MG-UNIT tablet Generic drug: calcium-vitamin D Take 1 tablet by mouth 2 (two) times daily.   denosumab 60 MG/ML Sosy injection Commonly known as: PROLIA Inject 60 mg into the skin every 6 (six) months.   Dexilant 60 MG capsule Generic drug: dexlansoprazole TAKE ONE CAPSULE BY MOUTH ONE TIME DAILY   Fish Oil-Flax Oil-Borage Oil Caps Take 1,000-1,400 mg by mouth daily.   GLUCOSAMINE-MSM PO Take 1 tablet by mouth daily.   Magnesium Citrate 125 MG Caps Take 1 tablet by mouth daily.   melatonin 5 MG Tabs Take 5 mg by mouth at bedtime as needed.   morphine 15 MG 12 hr tablet Commonly known as: MS CONTIN Take 1 tablet (15 mg total) by mouth every 12 (twelve) hours.   multivitamin tablet Take 1 tablet by mouth daily.   naproxen sodium 220 MG tablet Commonly known as: ALEVE Take 220 mg by mouth daily as needed.   ondansetron 8 MG tablet Commonly known as: Zofran Take 1 tablet (8 mg total) by mouth 2 (two) times daily. Start second day after chemotherapy. Then take as needed for nausea or vomiting.   oxyCODONE 5 MG immediate release tablet Commonly known as: Oxy IR/ROXICODONE Take 1/2 or 1 pill by mouth every 6 hours as needed for pain.   polyethylene glycol 17 g packet Commonly known as: MIRALAX / GLYCOLAX Take 17 g by mouth daily.    PRESERVISION AREDS 2 PO Take 1 tablet by mouth daily.   PROBIOTIC DAILY PO Take 2 tablets by mouth daily.   triamcinolone cream 0.1 % Commonly known as: KENALOG Apply 1 application topically as needed.   Turmeric Curcumin 500 MG Caps Take 1 capsule by mouth daily.   TYLENOL ARTHRITIS EXT RELIEF PO Take 1 tablet by mouth daily as needed (pain).       Allergies  Allergen Reactions  . Lisinopril Cough  . Primidone Other (See Comments)    Makes her feel "loopy and unsteady" Also, did not help the tremors        The results of significant diagnostics from this hospitalization (including imaging, microbiology, ancillary and laboratory) are listed below for reference.     Microbiology: Recent Results (from the past 240 hour(s))  SARS Coronavirus 2 by RT PCR (hospital order, performed in [facility] hospital lab) Nasopharyngeal Nasopharyngeal Swab     Status: None   Collection Time: 04/27/20  5:11 PM   Specimen: Nasopharyngeal Swab  Result Value Ref  Range Status   SARS Coronavirus 2 NEGATIVE NEGATIVE Final    Comment: (NOTE) SARS-CoV-2 target nucleic acids are NOT DETECTED. The SARS-CoV-2 RNA is generally detectable in upper and lower respiratory specimens during the acute phase of infection. The lowest concentration of SARS-CoV-2 viral copies this assay can detect is 250 copies / mL. A negative result does not preclude SARS-CoV-2 infection and should not be used as the sole basis for treatment or other patient management decisions.  A negative result may occur with improper specimen collection / handling, submission of specimen other than nasopharyngeal swab, presence of viral mutation(s) within the areas targeted by this assay, and inadequate number of viral copies (<250 copies / mL). A negative result must be combined with clinical observations, patient history, and epidemiological information. Fact Sheet for Patients:    StrictlyIdeas.no Fact Sheet for Healthcare Providers: BankingDealers.co.za This test is not yet approved or cleared  by the [place] FDA and has been authorized for detection and/or diagnosis of SARS-CoV-2 by FDA under an Emergency Use Authorization (EUA).  This EUA will remain in effect (meaning this test can be used) for the duration of the COVID-19 declaration under Section 564(b)(1) of the Act, 21 U.S.C. section 360bbb-3(b)(1), unless the authorization is terminated or revoked sooner. Performed at [facility], [place], [place] [address]   SARS CORONAVIRUS 2 (TAT 6-24 HRS) Nasopharyngeal Nasopharyngeal Swab     Status: None   Collection Time: 04/30/20  6:24 PM   Specimen: Nasopharyngeal Swab  Result Value Ref Range Status   SARS Coronavirus 2 NEGATIVE NEGATIVE Final    Comment: (NOTE) SARS-CoV-2 target nucleic acids are NOT DETECTED. The SARS-CoV-2 RNA is generally detectable in upper and lower respiratory specimens during the acute phase of infection. Negative results do not preclude SARS-CoV-2 infection, do not rule out co-infections with other pathogens, and should not be used as the sole basis for treatment or other patient management decisions. Negative results must be combined with clinical observations, patient history, and epidemiological information. The expected result is Negative. Fact Sheet for Patients: SugarRoll.be Fact Sheet for Healthcare Providers: https://www.woods-mathews.com/ This test is not yet approved or cleared by the [place] FDA and  has been authorized for detection and/or diagnosis of SARS-CoV-2 by FDA under an Emergency Use Authorization (EUA). This EUA will remain  in effect (meaning this test can be used) for the duration of the COVID-19 declaration under Section 56 4(b)(1) of the Act, 21 U.S.C. section 360bbb-3(b)(1),  unless the authorization is terminated or revoked sooner. Performed at [facility], [place] [address] [place], [place] [address]      Labs: BNP (last 3 results) No results for input(s): BNP in the last 8760 hours. Basic Metabolic Panel: Recent Labs  Lab 04/27/20 1115 04/27/20 1806 04/28/20 0458 04/29/20 0530  NA 124*  --  129* 131*  K 3.4*  --  3.8 4.6  CL 92*  --  98 102  CO2 24  --  25 24  GLUCOSE 119*  --  100* 101*  BUN 32*  --  23 14  CREATININE 0.83  --  0.78 0.60  CALCIUM 8.0*  --  7.5* 7.3*  MG 2.0  --  2.1  --   PHOS  --  2.5 2.3*  --    Liver Function Tests: Recent Labs  Lab 04/27/20 1115  AST 20  ALT 21  ALKPHOS 118  BILITOT 0.8  PROT 5.4*  ALBUMIN 3.1*   No  results for input(s): LIPASE, AMYLASE in the last 168 hours. No results for input(s): AMMONIA in the last 168 hours. CBC: Recent Labs  Lab 04/27/20 1115 04/28/20 0458 04/29/20 0530  WBC 9.0 7.3 8.0  NEUTROABS 8.0* 5.5 6.1  HGB 9.7* 9.2* 9.1*  HCT 28.5* 27.2* 26.2*  MCV 94.4 94.4 92.9  PLT 186 200 181   Cardiac Enzymes: No results for input(s): CKTOTAL, CKMB, CKMBINDEX, TROPONINI in the last 168 hours. BNP: Invalid input(s): POCBNP CBG: No results for input(s): GLUCAP in the last 168 hours. D-Dimer No results for input(s): DDIMER in the last 72 hours. Hgb A1c No results for input(s): HGBA1C in the last 72 hours. Lipid Profile No results for input(s): CHOL, HDL, LDLCALC, TRIG, CHOLHDL, LDLDIRECT in the last 72 hours. Thyroid function studies No results for input(s): TSH, T4TOTAL, T3FREE, THYROIDAB in the last 72 hours.  Invalid input(s): FREET3 Anemia work up No results for input(s): VITAMINB12, FOLATE, FERRITIN, TIBC, IRON, RETICCTPCT in the last 72 hours. Urinalysis    Component Value Date/Time   COLORURINE YELLOW (A) 04/23/2020 1003   APPEARANCEUR HAZY (A) 04/23/2020 1003   APPEARANCEUR Hazy (A) 04/18/2020 1320   LABSPEC 1.006 04/23/2020 1003   LABSPEC 1.008  11/26/2013 0954   PHURINE 6.0 04/23/2020 1003   GLUCOSEU NEGATIVE 04/23/2020 1003   GLUCOSEU Negative 11/26/2013 0954   HGBUR NEGATIVE 04/23/2020 1003   BILIRUBINUR NEGATIVE 04/23/2020 1003   BILIRUBINUR Negative 04/18/2020 1320   BILIRUBINUR Negative 11/26/2013 0954   KETONESUR 5 (A) 04/23/2020 1003   PROTEINUR NEGATIVE 04/23/2020 1003   NITRITE NEGATIVE 04/23/2020 1003   LEUKOCYTESUR SMALL (A) 04/23/2020 1003   LEUKOCYTESUR Negative 11/26/2013 0954   Sepsis Labs Invalid input(s): PROCALCITONIN,  WBC,  LACTICIDVEN Microbiology Recent Results (from the past 240 hour(s))  SARS Coronavirus 2 by RT PCR (hospital order, performed in [place] hospital lab) Nasopharyngeal Nasopharyngeal Swab     Status: None   Collection Time: 04/27/20  5:11 PM   Specimen: Nasopharyngeal Swab  Result Value Ref Range Status   SARS Coronavirus 2 NEGATIVE NEGATIVE Final    Comment: (NOTE) SARS-CoV-2 target nucleic acids are NOT DETECTED. The SARS-CoV-2 RNA is generally detectable in upper and lower respiratory specimens during the acute phase of infection. The lowest concentration of SARS-CoV-2 viral copies this assay can detect is 250 copies / mL. A negative result does not preclude SARS-CoV-2 infection and should not be used as the sole basis for treatment or other patient management decisions.  A negative result may occur with improper specimen collection / handling, submission of specimen other than nasopharyngeal swab, presence of viral mutation(s) within the areas targeted by this assay, and inadequate number of viral copies (<250 copies / mL). A negative result must be combined with clinical observations, patient history, and epidemiological information. Fact Sheet for Patients:   StrictlyIdeas.no Fact Sheet for Healthcare Providers: BankingDealers.co.za This test is not yet approved or cleared  by the [place] FDA and has been authorized  for detection and/or diagnosis of SARS-CoV-2 by FDA under an Emergency Use Authorization (EUA).  This EUA will remain in effect (meaning this test can be used) for the duration of the COVID-19 declaration under Section 564(b)(1) of the Act, 21 U.S.C. section 360bbb-3(b)(1), unless the authorization is terminated or revoked sooner. Performed at [facility], [place], [place] [address]   SARS CORONAVIRUS 2 (TAT 6-24 HRS) Nasopharyngeal Nasopharyngeal Swab     Status: None   Collection Time: 04/30/20  6:24  PM   Specimen: Nasopharyngeal Swab  Result Value Ref Range Status   SARS Coronavirus 2 NEGATIVE NEGATIVE Final    Comment: (NOTE) SARS-CoV-2 target nucleic acids are NOT DETECTED. The SARS-CoV-2 RNA is generally detectable in upper and lower respiratory specimens during the acute phase of infection. Negative results do not preclude SARS-CoV-2 infection, do not rule out co-infections with other pathogens, and should not be used as the sole basis for treatment or other patient management decisions. Negative results must be combined with clinical observations, patient history, and epidemiological information. The expected result is Negative. Fact Sheet for Patients: SugarRoll.be Fact Sheet for Healthcare Providers: https://www.woods-mathews.com/ This test is not yet approved or cleared by the [place] FDA and  has been authorized for detection and/or diagnosis of SARS-CoV-2 by FDA under an Emergency Use Authorization (EUA). This EUA will remain  in effect (meaning this test can be used) for the duration of the COVID-19 declaration under Section 56 4(b)(1) of the Act, 21 U.S.C. section 360bbb-3(b)(1), unless the authorization is terminated or revoked sooner. Performed at [facility], [place] [address], [place], [place] [address]      Time coordinating discharge: Over 30 minutes  SIGNED:   Desiree Hane, MD  [facility] 05/01/2020, 9:26 AM Pager   If 7PM-7AM, please contact night-coverage [email or web address] Password [email or web address]

## 2020-05-01 NOTE — Progress Notes (Addendum)
SLP Cancellation Note  Patient Details Name: ROSARIE MATUSKA MRN: [id number] DOB: Aug 06, 1932   Cancelled treatment:       Reason Eval/Treat Not Completed: (chart reviewed). Per chart notes yesterday w/ Palliative consultation and MD, pt has decided to forego future cancer treatments and instead wants to focus only on comfort and quality of life. Recommend pt focus on oral intake that is comfortable to swallow, primarily liquids as per chart notes. Possible consideration of easy to eat foods that are broken down and/or puree in nature such as yogurts, puddings, hot cereal. This information was provided to pt in room.  ST services is available if further needs arise but will sign off at this time as pt is preparing to discharge w/ Hospice services. NSG updated. Hospice consulted re: above and agreed.     Orinda Kenner, [place], CCC-SLP Iva Posten 05/01/2020, 8:26 AM

## 2020-05-01 NOTE — Telephone Encounter (Signed)
t

## 2020-05-01 NOTE — TOC Transition Note (Addendum)
Transition of Care [facility]) - CM/SW Discharge Note   Patient Details  Name: [name] MRN: [id number] Date of Birth: [birth year]  [facility] CM/SW Contact:  Elease Hashimoto, LCSW Phone Number: 05/01/2020, 9:15 AM   Clinical Narrative:  Plan to go to the healthcare unit at [facility], along with hospice services. Her husband will transport her via car and the DNR is on the chart-in the DC packet. FL2 completed and sent over to facility. No further follow due to transfer today. Going to room 355 Bedside RN to call [phone].      Barriers to Discharge: Barriers Resolved   Patient Goals and CMS Choice        Discharge Placement                       Discharge Plan and Services In-house Referral: Clinical Social Work   Post Acute Care Choice: Hospice                               Social Determinants of Health (SDOH) Interventions     Readmission Risk Interventions No flowsheet data found.

## 2020-06-14 DEATH — deceased

## 2020-06-20 ENCOUNTER — Ambulatory Visit: Payer: Self-pay | Admitting: Urology

## 2020-10-25 ENCOUNTER — Encounter (INDEPENDENT_AMBULATORY_CARE_PROVIDER_SITE_OTHER): Payer: Medicare Other

## 2020-10-25 ENCOUNTER — Ambulatory Visit (INDEPENDENT_AMBULATORY_CARE_PROVIDER_SITE_OTHER): Payer: Medicare Other | Admitting: Vascular Surgery

## 2021-11-18 IMAGING — CT CT ABD-PELV W/O CM
1 of 2 series · 13 of 32 positions shown, 17 images · non-contrast
Comparison: September 12, 2019.
COMPARISON: September 12, 2019.

Addendum:
CLINICAL DATA: Right-sided abdominal pain.

EXAM:
CT ABDOMEN AND PELVIS WITHOUT CONTRAST
TECHNIQUE: Multidetector CT imaging of the abdomen and pelvis was performed
following the standard protocol without IV contrast.

[Series 2: axial st · axial · 0.71mm/px · z∈[-988,-603]mm · 13 of 89 slices shown, 17 images]
[im 8/89  soft-tissue]
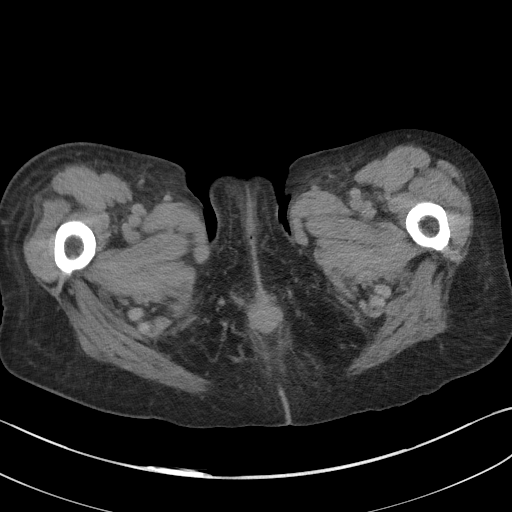
[im 8/89  bone]
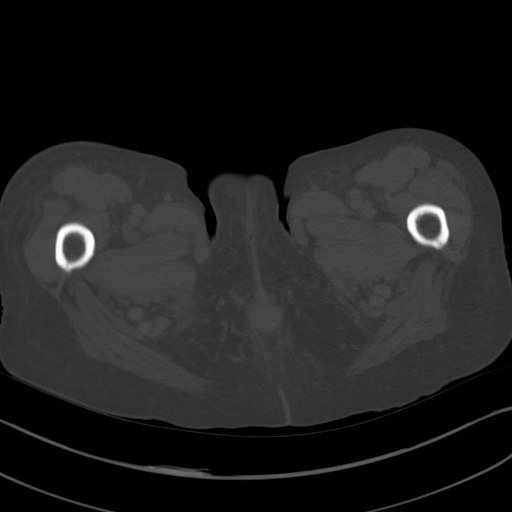
[im 15/89  soft-tissue]
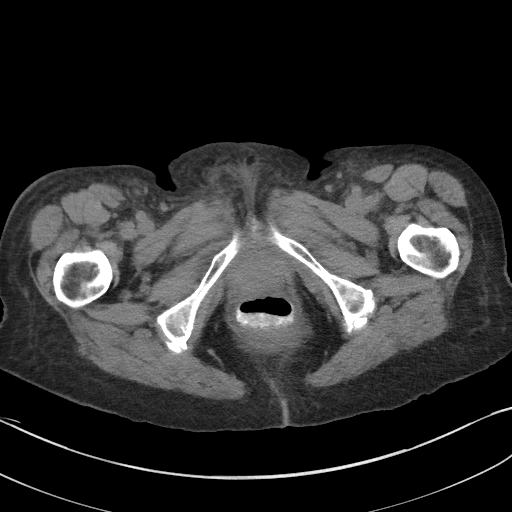
[im 23/89  soft-tissue]
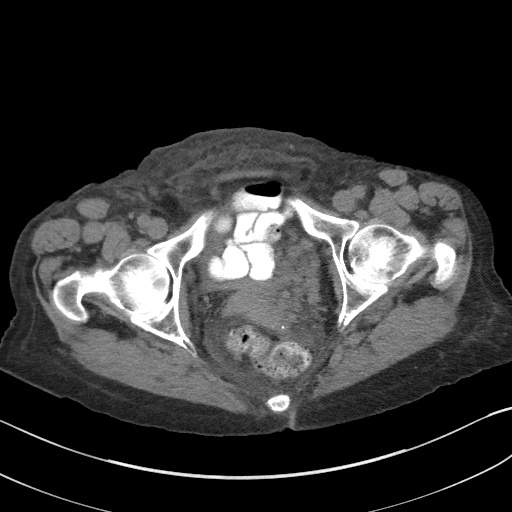
[im 30/89  soft-tissue]
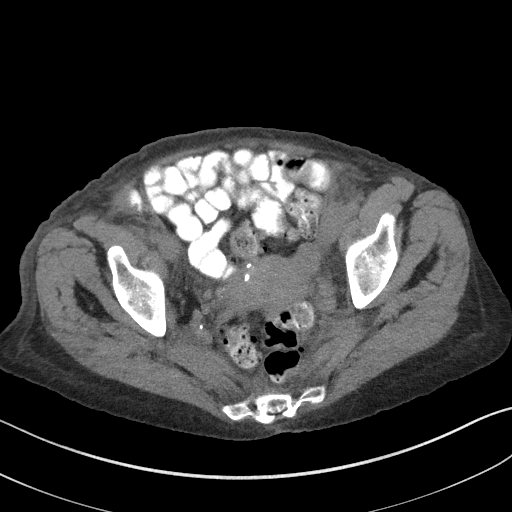
[im 37/89  soft-tissue]
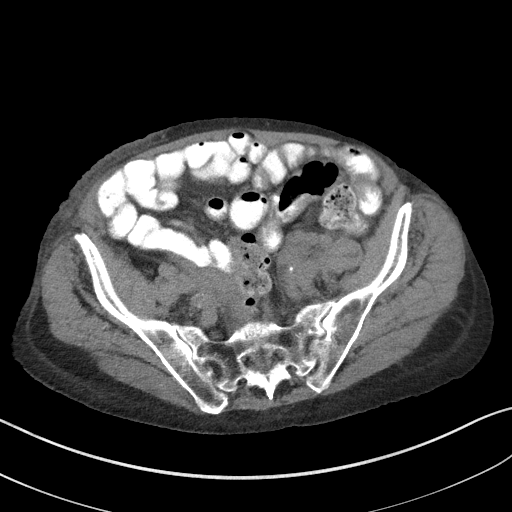
[im 45/89  soft-tissue]
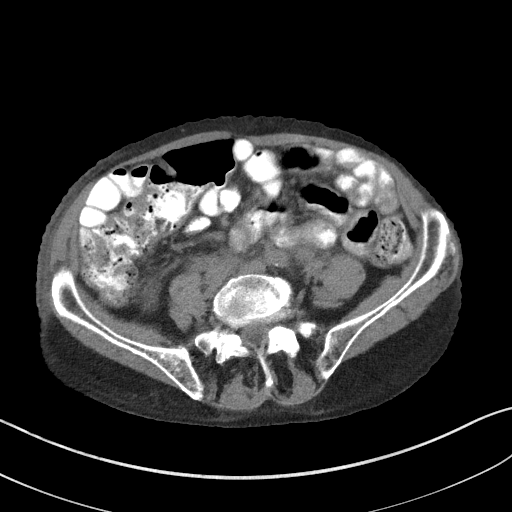
[im 52/89  soft-tissue]
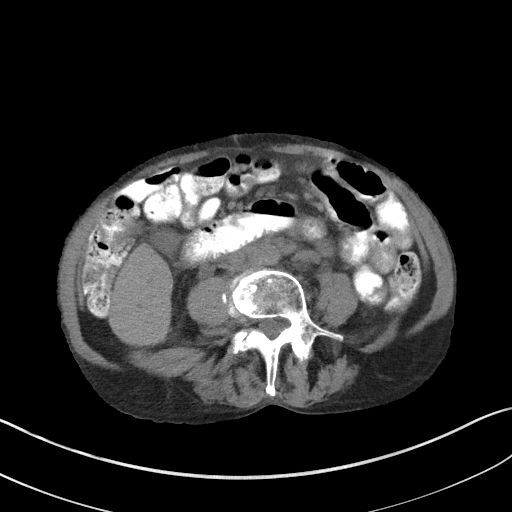
[im 59/89  soft-tissue]
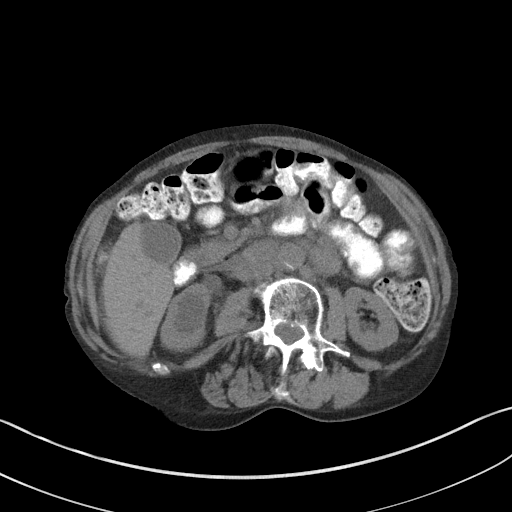
[im 67/89  soft-tissue]
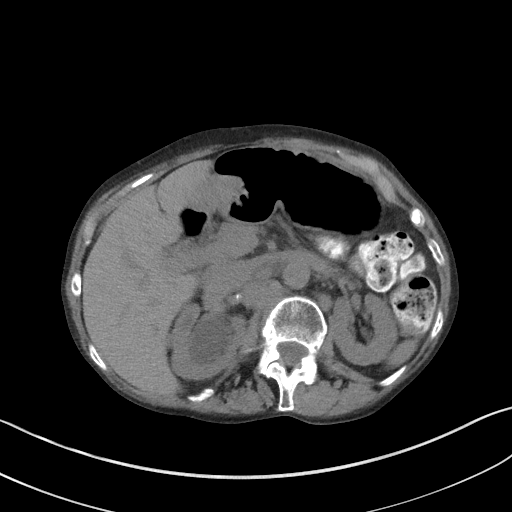
[im 67/89  bone]
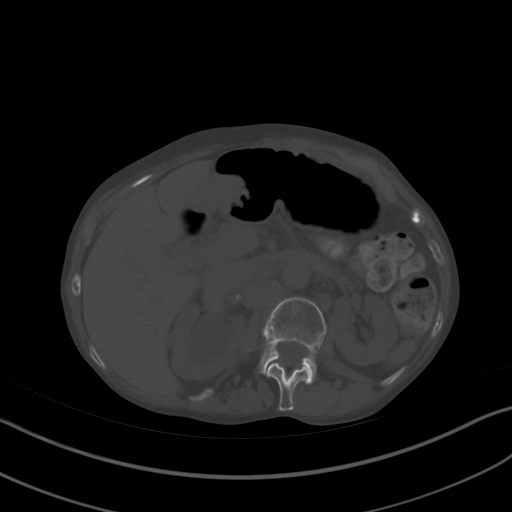
[im 74/89  soft-tissue]
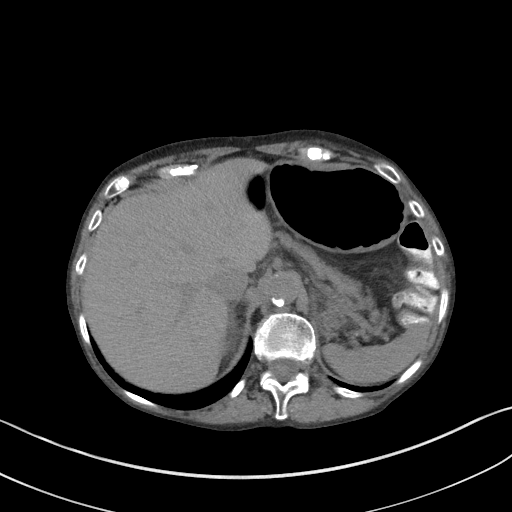
[im 74/89  lung]
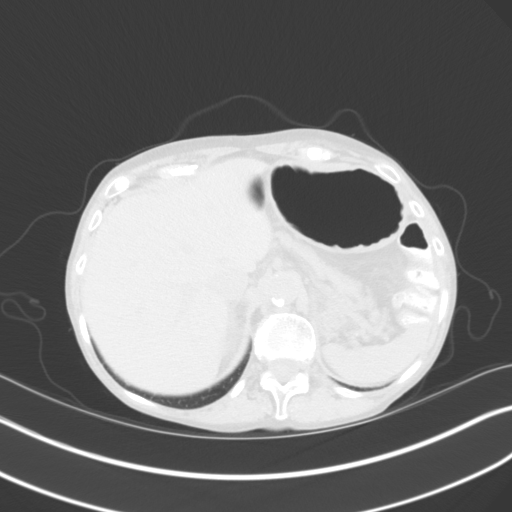
[im 78/89  lung]
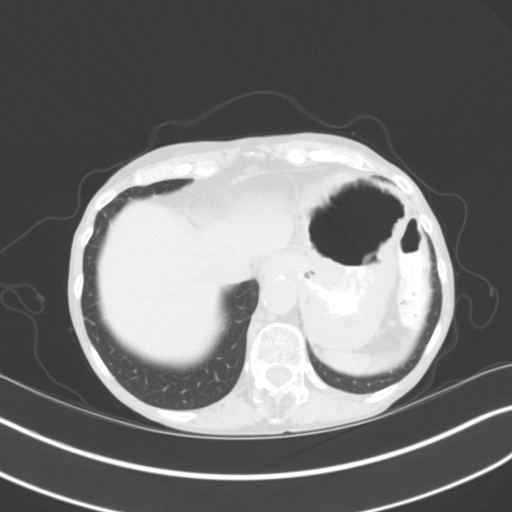
[im 81/89  soft-tissue]
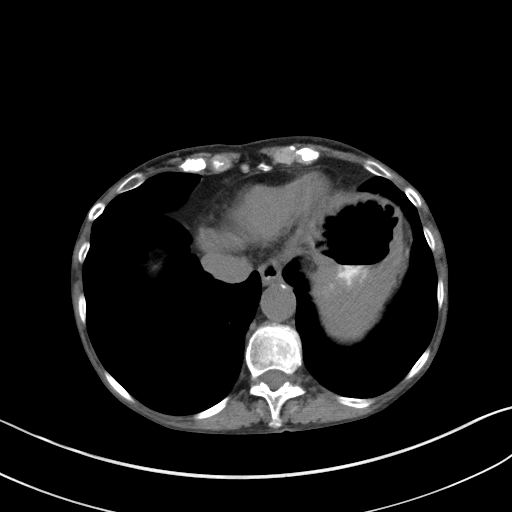
[im 81/89  lung]
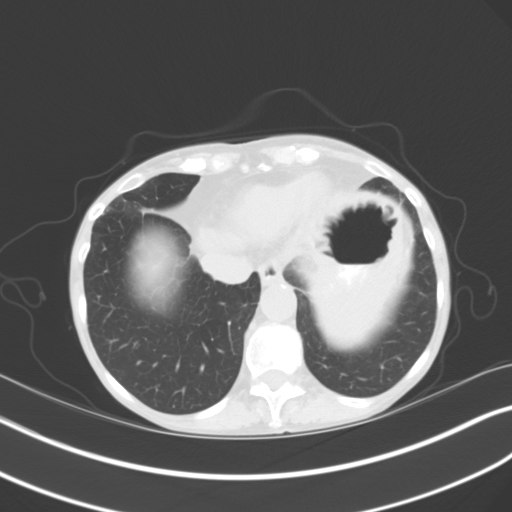
[im 85/89  lung]
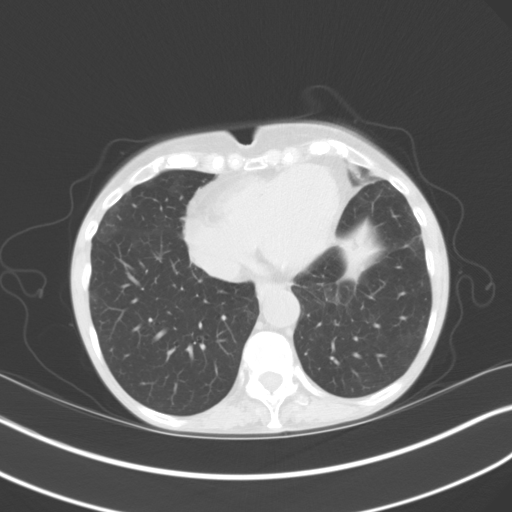

[13 of 32 positions shown; findings below may reference images not displayed]

FINDINGS: Lower chest: No acute abnormality.

Hepatobiliary: No focal liver abnormality is seen. No gallstones,
gallbladder wall thickening, or biliary dilatation.

Pancreas: Unremarkable. No pancreatic ductal dilatation or
surrounding inflammatory changes.

Spleen: Calcified splenic granulomata are noted.

Adrenals/Urinary Tract: Adrenal glands appear normal. Left kidney
and ureter are unremarkable. Moderate right hydroureteronephrosis is
noted which appears to extend to the L5 level, but no definite
obstructing calculus is noted. Urinary bladder is decompressed.

Stomach/Bowel: Stomach is unremarkable. There is no evidence of
bowel obstruction or inflammation. Stool is seen throughout the
colon. Sigmoid diverticulosis is noted without inflammation. The
appendix is not clearly visualized, but no inflammation is noted in
the right lower quadrant.

Vascular/Lymphatic: Aortic atherosclerosis. No enlarged abdominal or
pelvic lymph nodes.

Reproductive: Several small calcified uterine fibroids may be
present. No definite adnexal abnormality is noted.

Other: No abdominal wall hernia or abnormality. No abdominopelvic
ascites.

Musculoskeletal: No acute or significant osseous findings.
IMPRESSION: 1. Moderate right hydroureteronephrosis is noted which appears to
extend to the L5 level, but no definite obstructing calculus is
noted. Cause of distal ureteral obstruction is unknown. CT scan with
intravenous contrast may be performed for further evaluation.
2. Sigmoid diverticulosis without inflammation.
3. Several small calcified uterine fibroids.

Aortic Atherosclerosis (RVIWQ-EZO.O).

ADDENDUM:
Upon further review, there does appear to be the interval
development of retroperitoneal adenopathy, although evaluation is
limited due the lack of intravenous contrast. Left periaortic lymph
node measures 2 cm. 2.6 cm right paratracheal lymph node is noted.
There is probable bilateral iliac adenopathy is well, although
evaluation is limited due to the lack of intravenous contrast. As
noted in the impression, CT scan with intravenous and oral contrast
is recommended for further evaluation if the patient can tolerate
intravenous contrast. This adenopathy may be the cause of distal
right ureteral obstruction.

*** End of Addendum ***
FINDINGS: Lower chest: No acute abnormality.

Hepatobiliary: No focal liver abnormality is seen. No gallstones,
gallbladder wall thickening, or biliary dilatation.

Pancreas: Unremarkable. No pancreatic ductal dilatation or
surrounding inflammatory changes.

Spleen: Calcified splenic granulomata are noted.

Adrenals/Urinary Tract: Adrenal glands appear normal. Left kidney
and ureter are unremarkable. Moderate right hydroureteronephrosis is
noted which appears to extend to the L5 level, but no definite
obstructing calculus is noted. Urinary bladder is decompressed.

Stomach/Bowel: Stomach is unremarkable. There is no evidence of
bowel obstruction or inflammation. Stool is seen throughout the
colon. Sigmoid diverticulosis is noted without inflammation. The
appendix is not clearly visualized, but no inflammation is noted in
the right lower quadrant.

Vascular/Lymphatic: Aortic atherosclerosis. No enlarged abdominal or
pelvic lymph nodes.

Reproductive: Several small calcified uterine fibroids may be
present. No definite adnexal abnormality is noted.

Other: No abdominal wall hernia or abnormality. No abdominopelvic
ascites.

Musculoskeletal: No acute or significant osseous findings.
IMPRESSION: 1. Moderate right hydroureteronephrosis is noted which appears to
extend to the L5 level, but no definite obstructing calculus is
noted. Cause of distal ureteral obstruction is unknown. CT scan with
intravenous contrast may be performed for further evaluation.
2. Sigmoid diverticulosis without inflammation.
3. Several small calcified uterine fibroids.

Aortic Atherosclerosis (RVIWQ-EZO.O).

## 2023-01-07 ENCOUNTER — Other Ambulatory Visit (HOSPITAL_BASED_OUTPATIENT_CLINIC_OR_DEPARTMENT_OTHER): Payer: Self-pay
